# Patient Record
Sex: Female | Born: 1935 | Race: White | Hispanic: No | Smoking: Never smoker
Health system: Southern US, Community
[De-identification: ages and names within clinical notes are randomized; demographics above are authoritative.]

## PROBLEM LIST (undated history)

## (undated) DIAGNOSIS — I10 Essential (primary) hypertension: Secondary | ICD-10-CM

## (undated) DIAGNOSIS — R531 Weakness: Secondary | ICD-10-CM

## (undated) DIAGNOSIS — J449 Chronic obstructive pulmonary disease, unspecified: Secondary | ICD-10-CM

## (undated) DIAGNOSIS — K5900 Constipation, unspecified: Secondary | ICD-10-CM

## (undated) DIAGNOSIS — F0390 Unspecified dementia without behavioral disturbance: Secondary | ICD-10-CM

## (undated) DIAGNOSIS — K219 Gastro-esophageal reflux disease without esophagitis: Secondary | ICD-10-CM

## (undated) DIAGNOSIS — M810 Age-related osteoporosis without current pathological fracture: Secondary | ICD-10-CM

## (undated) DIAGNOSIS — J309 Allergic rhinitis, unspecified: Secondary | ICD-10-CM

## (undated) DIAGNOSIS — I4891 Unspecified atrial fibrillation: Secondary | ICD-10-CM

## (undated) DIAGNOSIS — H409 Unspecified glaucoma: Secondary | ICD-10-CM

## (undated) DIAGNOSIS — J8410 Pulmonary fibrosis, unspecified: Secondary | ICD-10-CM

## (undated) DIAGNOSIS — R1013 Epigastric pain: Secondary | ICD-10-CM

## (undated) HISTORY — PX: ABDOMINAL HYSTERECTOMY: SHX81

## (undated) HISTORY — PX: TOTAL HIP ARTHROPLASTY: SHX124

---

## 2005-01-19 ENCOUNTER — Other Ambulatory Visit: Payer: Self-pay

## 2005-01-19 ENCOUNTER — Inpatient Hospital Stay: Payer: Self-pay | Admitting: Internal Medicine

## 2006-08-08 ENCOUNTER — Emergency Department: Payer: Self-pay | Admitting: Emergency Medicine

## 2007-11-14 ENCOUNTER — Emergency Department: Payer: Self-pay | Admitting: Emergency Medicine

## 2007-11-14 ENCOUNTER — Other Ambulatory Visit: Payer: Self-pay

## 2011-03-16 ENCOUNTER — Emergency Department: Payer: Self-pay | Admitting: Emergency Medicine

## 2011-04-18 ENCOUNTER — Emergency Department: Payer: Self-pay | Admitting: Emergency Medicine

## 2011-05-11 ENCOUNTER — Emergency Department: Payer: Self-pay | Admitting: *Deleted

## 2011-08-06 ENCOUNTER — Inpatient Hospital Stay: Payer: Self-pay | Admitting: Internal Medicine

## 2012-01-18 ENCOUNTER — Inpatient Hospital Stay: Payer: Self-pay | Admitting: Internal Medicine

## 2012-01-18 LAB — CBC WITH DIFFERENTIAL/PLATELET
Basophil #: 0 10*3/uL (ref 0.0–0.1)
Basophil %: 0.3 %
Eosinophil #: 0 10*3/uL (ref 0.0–0.7)
Lymphocyte #: 1.1 10*3/uL (ref 1.0–3.6)
MCV: 99 fL (ref 80–100)
Monocyte #: 0.7 x10 3/mm (ref 0.2–0.9)
Monocyte %: 7.2 %
Neutrophil #: 8.1 10*3/uL — ABNORMAL HIGH (ref 1.4–6.5)
Platelet: 241 10*3/uL (ref 150–440)

## 2012-01-18 LAB — BASIC METABOLIC PANEL
BUN: 26 mg/dL — ABNORMAL HIGH (ref 7–18)
Calcium, Total: 9.2 mg/dL (ref 8.5–10.1)
Co2: 32 mmol/L (ref 21–32)
Creatinine: 0.89 mg/dL (ref 0.60–1.30)
EGFR (African American): >60
Osmolality: 279 (ref 275–301)
Potassium: 4.7 mmol/L (ref 3.5–5.1)
Sodium: 137 mmol/L (ref 136–145)

## 2012-01-18 LAB — PROTIME-INR: INR: 2.1

## 2012-01-19 LAB — BASIC METABOLIC PANEL
Anion Gap: 8 (ref 7–16)
Chloride: 98 mmol/L (ref 98–107)
Co2: 31 mmol/L (ref 21–32)
Creatinine: 1.34 mg/dL — ABNORMAL HIGH (ref 0.60–1.30)
EGFR (African American): 41 — ABNORMAL LOW
EGFR (Non-African Amer.): 35 — ABNORMAL LOW
Glucose: 116 mg/dL — ABNORMAL HIGH (ref 65–99)
Osmolality: 280 (ref 275–301)
Potassium: 4.6 mmol/L (ref 3.5–5.1)
Sodium: 137 mmol/L (ref 136–145)

## 2012-01-19 LAB — CBC WITH DIFFERENTIAL/PLATELET
Eosinophil #: 0.2 10*3/uL (ref 0.0–0.7)
HCT: 26.9 % — ABNORMAL LOW (ref 35.0–47.0)
Lymphocyte #: 2.2 10*3/uL (ref 1.0–3.6)
Lymphocyte %: 19.7 %
MCH: 33.1 pg (ref 26.0–34.0)
MCV: 99 fL (ref 80–100)
Monocyte %: 12 %
Neutrophil #: 7.3 10*3/uL — ABNORMAL HIGH (ref 1.4–6.5)
Neutrophil %: 66.6 %
WBC: 11 10*3/uL (ref 3.6–11.0)

## 2012-01-19 LAB — PROTIME-INR: INR: 2

## 2012-01-19 LAB — HEMOGLOBIN: HGB: 9.4 g/dL — ABNORMAL LOW (ref 12.0–16.0)

## 2012-01-20 LAB — CBC WITH DIFFERENTIAL/PLATELET
Basophil #: 0 10*3/uL (ref 0.0–0.1)
Basophil %: 0.3 %
Eosinophil #: 0.2 10*3/uL (ref 0.0–0.7)
Eosinophil %: 1.5 %
HCT: 27.5 % — ABNORMAL LOW (ref 35.0–47.0)
MCH: 33.3 pg (ref 26.0–34.0)
MCV: 98 fL (ref 80–100)
Monocyte #: 0.9 x10 3/mm (ref 0.2–0.9)
Neutrophil #: 7.6 10*3/uL — ABNORMAL HIGH (ref 1.4–6.5)
Neutrophil %: 73.8 %
Platelet: 192 10*3/uL (ref 150–440)
RDW: 12.3 % (ref 11.5–14.5)
WBC: 10.3 10*3/uL (ref 3.6–11.0)

## 2012-01-20 LAB — BASIC METABOLIC PANEL
BUN: 26 mg/dL — ABNORMAL HIGH (ref 7–18)
Chloride: 97 mmol/L — ABNORMAL LOW (ref 98–107)
Co2: 31 mmol/L (ref 21–32)
Creatinine: 0.93 mg/dL (ref 0.60–1.30)
EGFR (Non-African Amer.): 54 — ABNORMAL LOW
Glucose: 102 mg/dL — ABNORMAL HIGH (ref 65–99)
Osmolality: 277 (ref 275–301)
Potassium: 4 mmol/L (ref 3.5–5.1)
Sodium: 136 mmol/L (ref 136–145)

## 2012-01-21 LAB — HEMOGLOBIN: HGB: 9.3 g/dL — ABNORMAL LOW (ref 12.0–16.0)

## 2012-01-21 LAB — PROTIME-INR
INR: 1.7
Prothrombin Time: 20.4 secs — ABNORMAL HIGH (ref 11.5–14.7)

## 2013-03-15 ENCOUNTER — Inpatient Hospital Stay: Payer: Self-pay | Admitting: Orthopedic Surgery

## 2013-03-15 LAB — COMPREHENSIVE METABOLIC PANEL
Albumin: 3.2 g/dL — ABNORMAL LOW (ref 3.4–5.0)
Alkaline Phosphatase: 92 U/L (ref 50–136)
Anion Gap: 6 — ABNORMAL LOW (ref 7–16)
BUN: 29 mg/dL — ABNORMAL HIGH (ref 7–18)
Potassium: 4 mmol/L (ref 3.5–5.1)
SGOT(AST): 32 U/L (ref 15–37)
SGPT (ALT): 30 U/L (ref 12–78)
Sodium: 132 mmol/L — ABNORMAL LOW (ref 136–145)
Total Protein: 7.6 g/dL (ref 6.4–8.2)

## 2013-03-15 LAB — URINALYSIS, COMPLETE
Bilirubin,UR: NEGATIVE
Hyaline Cast: 1
Ketone: NEGATIVE
Leukocyte Esterase: NEGATIVE
Ph: 8 (ref 4.5–8.0)
RBC,UR: 4 /HPF (ref 0–5)
Specific Gravity: 1.011 (ref 1.003–1.030)
WBC UR: <1 /HPF (ref 0–5)

## 2013-03-15 LAB — CBC WITH DIFFERENTIAL/PLATELET
Eosinophil %: 0 %
HGB: 9.8 g/dL — ABNORMAL LOW (ref 12.0–16.0)
Lymphocyte #: 0.7 10*3/uL — ABNORMAL LOW (ref 1.0–3.6)
Lymphocyte %: 3.3 %
MCH: 33 pg (ref 26.0–34.0)
Monocyte %: 5.3 %
Neutrophil %: 91.3 %
Platelet: 265 10*3/uL (ref 150–440)
RBC: 2.97 10*6/uL — ABNORMAL LOW (ref 3.80–5.20)
RDW: 13.4 % (ref 11.5–14.5)

## 2013-03-15 LAB — PROTIME-INR: INR: 2.2

## 2013-03-16 LAB — CBC WITH DIFFERENTIAL/PLATELET
Basophil #: 0 10*3/uL (ref 0.0–0.1)
Eosinophil #: 0.1 10*3/uL (ref 0.0–0.7)
Eosinophil %: 0.4 %
HGB: 10.4 g/dL — ABNORMAL LOW (ref 12.0–16.0)
Lymphocyte %: 13.7 %
MCH: 33.3 pg (ref 26.0–34.0)
MCHC: 34.9 g/dL (ref 32.0–36.0)
MCV: 96 fL (ref 80–100)
Monocyte #: 1.2 x10 3/mm — ABNORMAL HIGH (ref 0.2–0.9)
Monocyte %: 9 %
Neutrophil #: 10.4 10*3/uL — ABNORMAL HIGH (ref 1.4–6.5)
Platelet: 243 10*3/uL (ref 150–440)
RBC: 3.11 10*6/uL — ABNORMAL LOW (ref 3.80–5.20)
WBC: 13.6 10*3/uL — ABNORMAL HIGH (ref 3.6–11.0)

## 2013-03-16 LAB — BASIC METABOLIC PANEL
Anion Gap: 1 — ABNORMAL LOW (ref 7–16)
Calcium, Total: 9.3 mg/dL (ref 8.5–10.1)
Chloride: 96 mmol/L — ABNORMAL LOW (ref 98–107)
Co2: 35 mmol/L — ABNORMAL HIGH (ref 21–32)
Creatinine: 1.01 mg/dL (ref 0.60–1.30)
EGFR (African American): 57 — ABNORMAL LOW
EGFR (Non-African Amer.): 49 — ABNORMAL LOW
Glucose: 94 mg/dL (ref 65–99)
Osmolality: 267 (ref 275–301)

## 2013-03-16 LAB — PROTIME-INR
INR: 1.1
Prothrombin Time: 14.8 secs — ABNORMAL HIGH (ref 11.5–14.7)

## 2013-03-16 IMAGING — CR DG CHEST 2V
1 series · 2 of 2 positions shown · non-contrast
Comparison: none

REASON FOR EXAM: cough
COMMENTS:

[Series 1: view not recorded · 0.17mm/px · 2 of 2 slices shown]
[im 1/2]
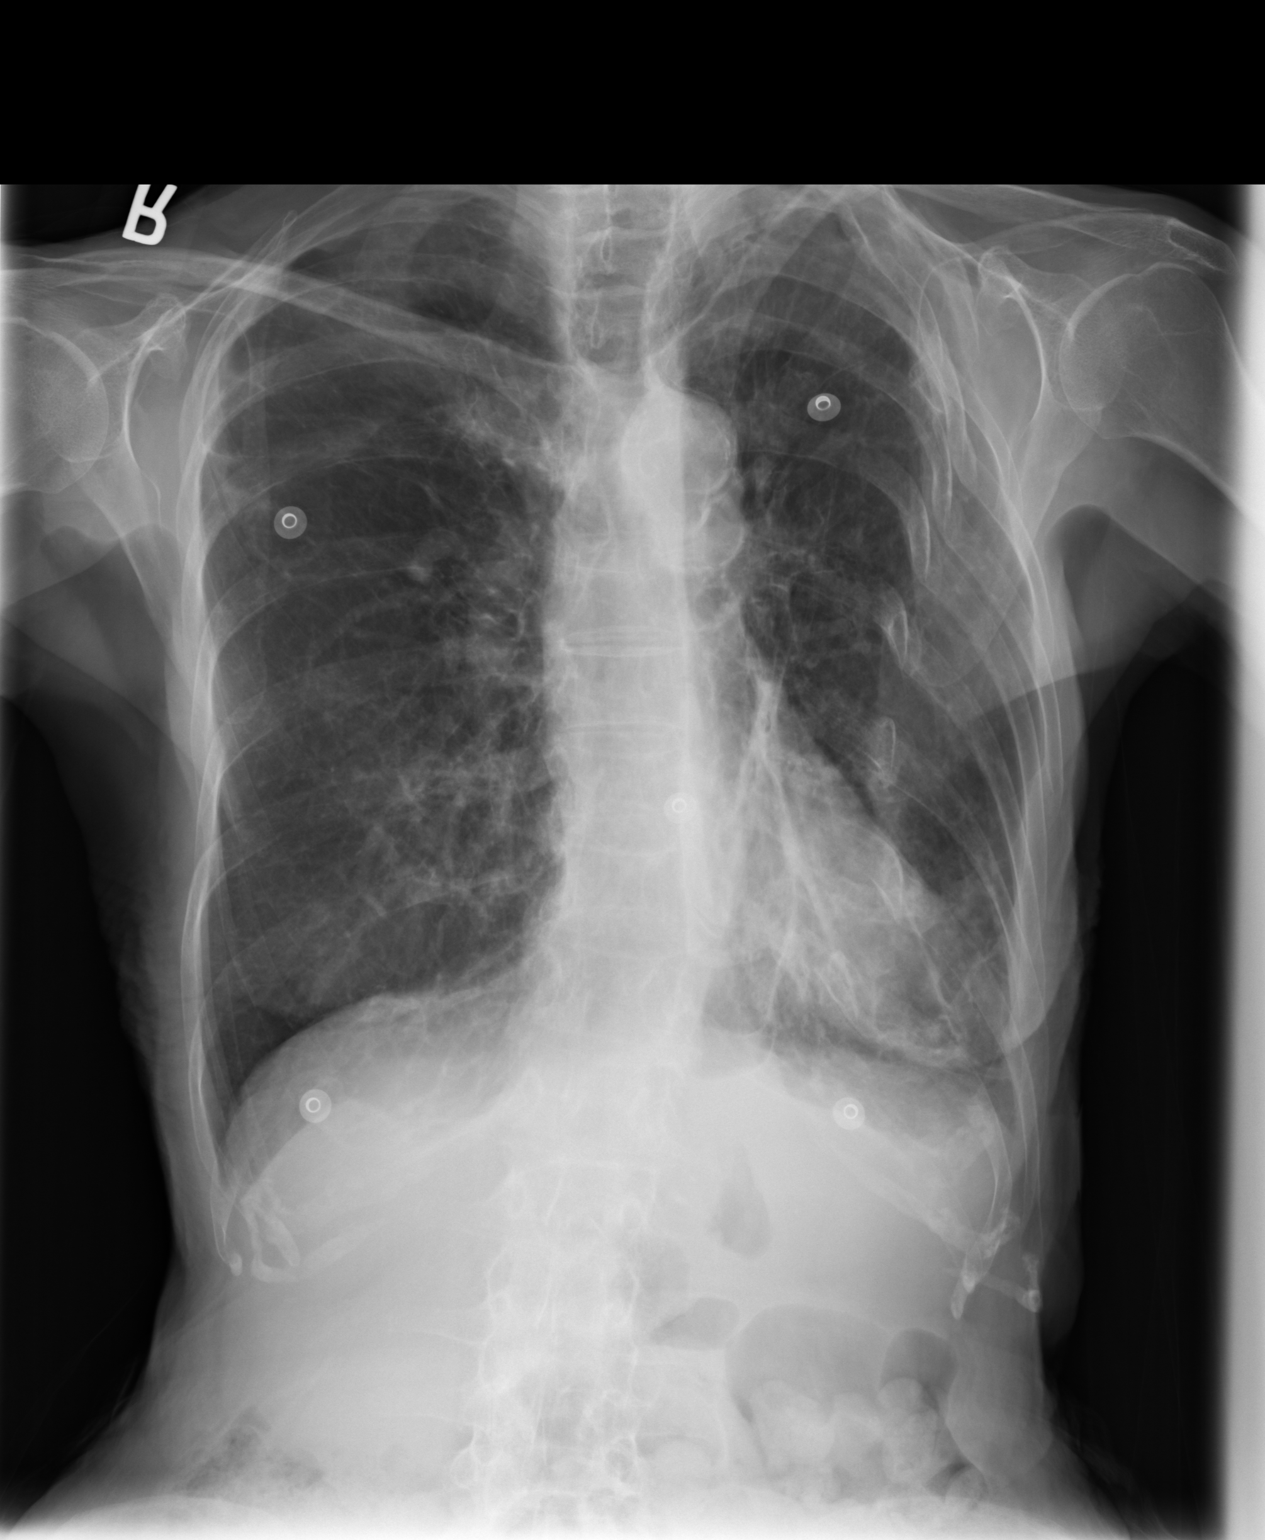
[im 2/2]
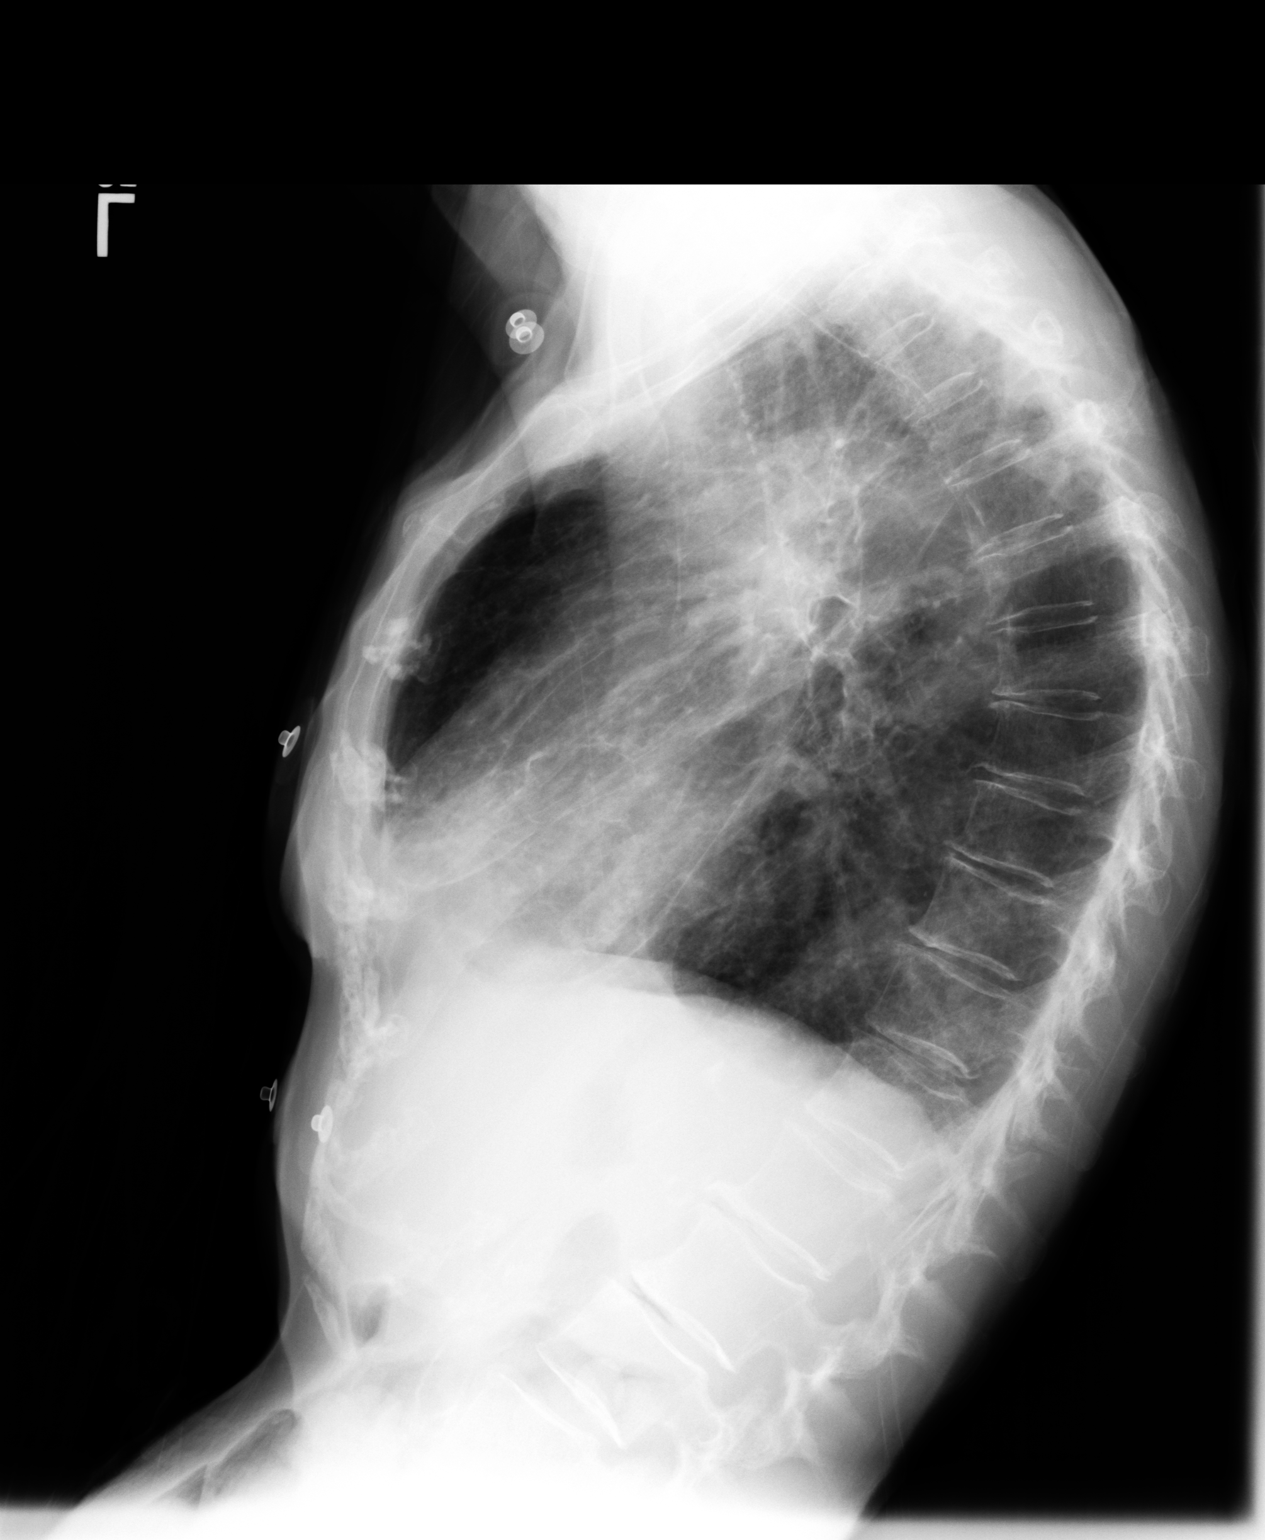

[2 of 2 positions shown; findings below may reference images not displayed]

PROCEDURE:     DXR - DXR CHEST PA (OR AP) AND LATERAL  - March 16, 2011  [DATE]

RESULT:     The lungs are hyperinflated. Old rib deformities are seen
involving the ribs one through 10 at least. There is no pleural effusion or
pneumothorax. There is no shift of the mediastinum. The cardiac silhouette
is normal in size. Coarse lung markings in the left infrahilar region are
present. These lie in the region of the lingula on the lateral film.
IMPRESSION: 1. There are chronic changes in both lungs compatible with COPD. There is
likely superimposed atelectasis or early infiltrate in the lingula.
2. Extensive deformity of the posterior ribs on the left is present.

## 2013-03-17 LAB — PROTIME-INR
INR: 1.3
Prothrombin Time: 16.3 secs — ABNORMAL HIGH (ref 11.5–14.7)

## 2013-03-17 LAB — BASIC METABOLIC PANEL
BUN: 15 mg/dL (ref 7–18)
Chloride: 100 mmol/L (ref 98–107)
Co2: 34 mmol/L — ABNORMAL HIGH (ref 21–32)
Creatinine: 1 mg/dL (ref 0.60–1.30)
Glucose: 128 mg/dL — ABNORMAL HIGH (ref 65–99)
Potassium: 4.6 mmol/L (ref 3.5–5.1)

## 2013-03-17 LAB — PLATELET COUNT: Platelet: 219 10*3/uL (ref 150–440)

## 2013-03-18 LAB — HEMOGLOBIN
HGB: 7.1 g/dL — ABNORMAL LOW (ref 12.0–16.0)
HGB: 8.9 g/dL — ABNORMAL LOW (ref 12.0–16.0)

## 2013-03-18 LAB — PROTIME-INR
INR: 4.5
Prothrombin Time: 40.7 secs — ABNORMAL HIGH (ref 11.5–14.7)

## 2013-03-19 LAB — PROTIME-INR: INR: 4.2

## 2013-03-19 LAB — HEMOGLOBIN: HGB: 9.2 g/dL — ABNORMAL LOW

## 2013-03-20 LAB — PROTIME-INR
INR: 3.7
Prothrombin Time: 35.2 secs — ABNORMAL HIGH (ref 11.5–14.7)

## 2013-03-21 ENCOUNTER — Encounter: Payer: Self-pay | Admitting: Internal Medicine

## 2013-03-21 LAB — PROTIME-INR
INR: 2.6
Prothrombin Time: 27.2 secs — ABNORMAL HIGH (ref 11.5–14.7)

## 2013-03-21 LAB — PATHOLOGY REPORT

## 2013-03-23 LAB — PROTIME-INR
INR: 1.7
Prothrombin Time: 19.6 secs — ABNORMAL HIGH (ref 11.5–14.7)

## 2013-03-27 LAB — PROTIME-INR
INR: 1.6
Prothrombin Time: 18.6 secs — ABNORMAL HIGH (ref 11.5–14.7)

## 2013-04-04 ENCOUNTER — Encounter: Payer: Self-pay | Admitting: Internal Medicine

## 2013-04-06 LAB — PROTIME-INR: Prothrombin Time: 22.1 secs — ABNORMAL HIGH (ref 11.5–14.7)

## 2013-04-18 IMAGING — CR DG CHEST 1V PORT
1 series · 1 of 1 positions shown · non-contrast
Comparison: none

REASON FOR EXAM: sob
COMMENTS:

[view not recorded]
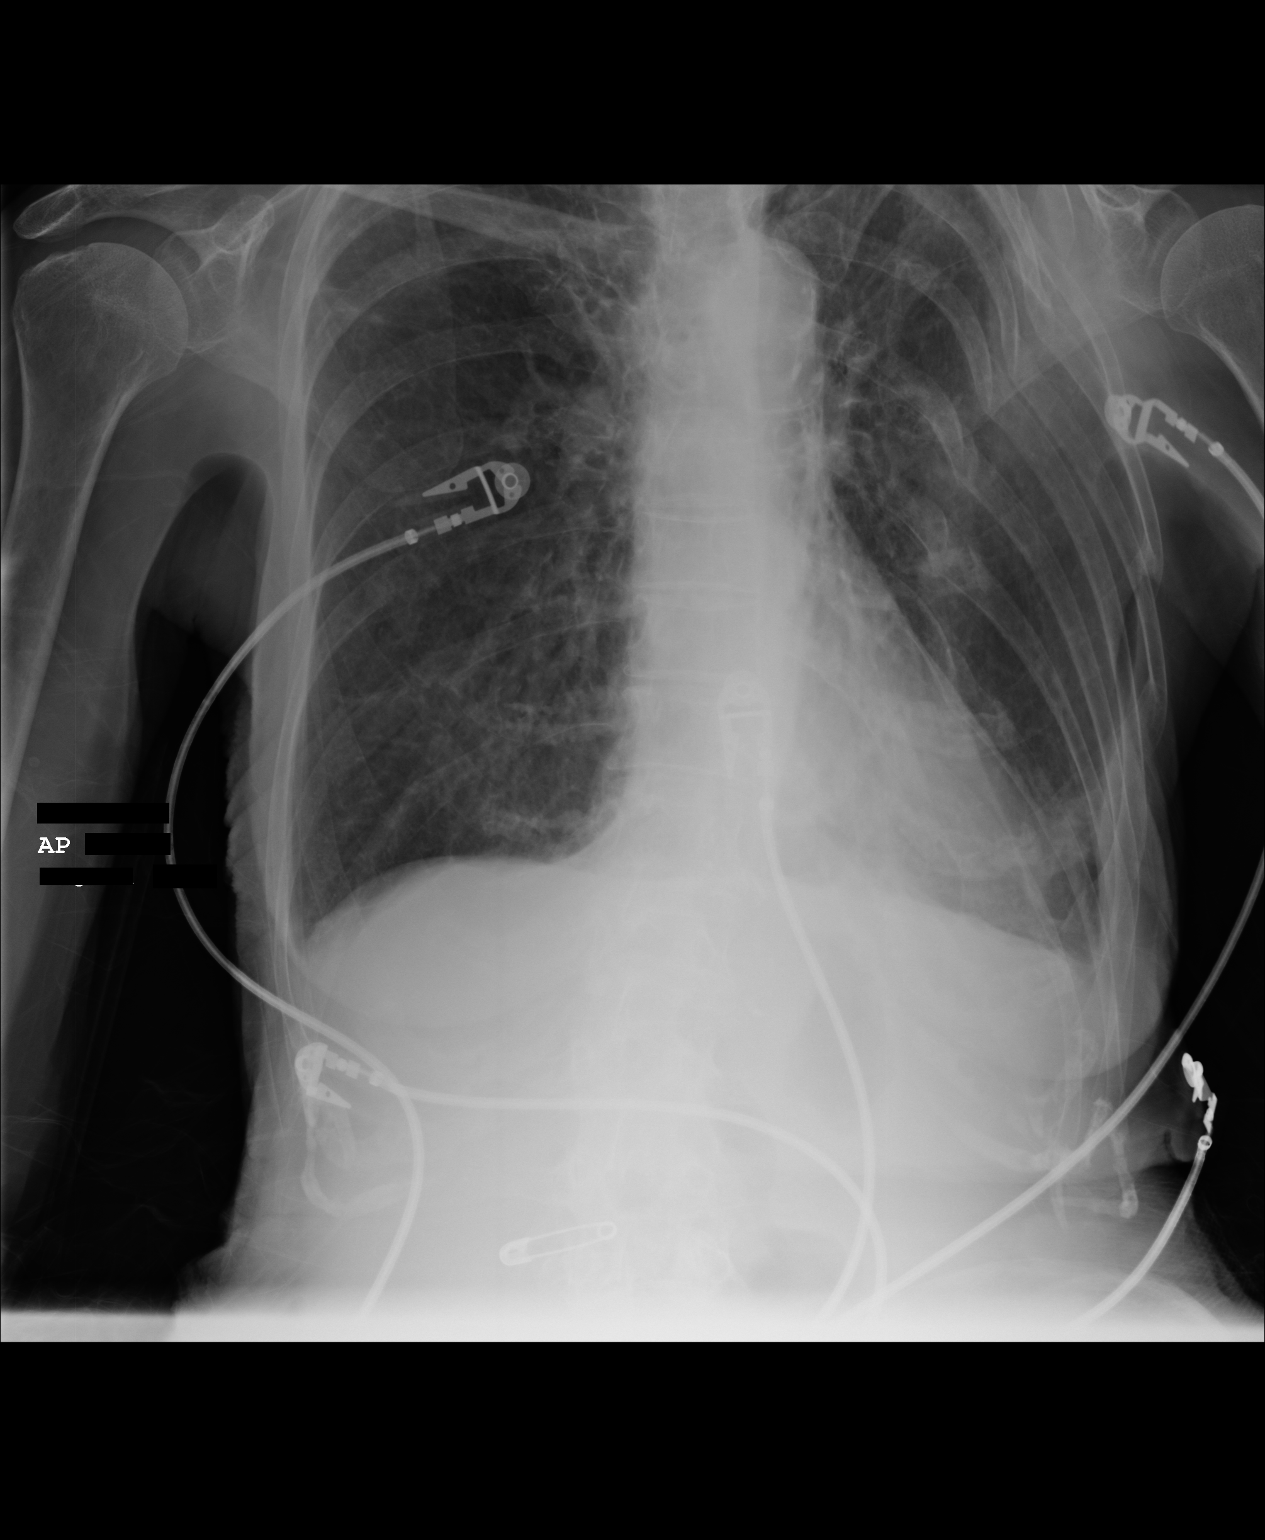

[1 of 1 positions shown; findings below may reference images not displayed]

PROCEDURE:     DXR - DXR PORTABLE CHEST SINGLE VIEW  - April 18, 2011 [DATE]

RESULT:     Comparison is made to study of 08/15/2011.

The lungs are well-expanded. Multiple old rib fractures on the left are
present. I see no pleural effusion or pneumothorax. The cardiac silhouette
is normal in size. The pulmonary vascularity is not engorged. I see no
pleural effusion.
IMPRESSION: The findings are consistent with COPD. I do not see
evidence of CHF nor of pneumonia.

## 2013-04-20 LAB — URINALYSIS, COMPLETE
Bilirubin,UR: NEGATIVE
Glucose,UR: NEGATIVE mg/dL (ref 0–75)
Ketone: NEGATIVE
Nitrite: NEGATIVE
Ph: 5 (ref 4.5–8.0)
RBC,UR: 4 /HPF (ref 0–5)
Specific Gravity: 1.018 (ref 1.003–1.030)
WBC UR: 4 /HPF (ref 0–5)

## 2013-04-22 LAB — URINE CULTURE

## 2013-04-25 LAB — PROTIME-INR
INR: 2.2
Prothrombin Time: 24.3 secs — ABNORMAL HIGH (ref 11.5–14.7)

## 2013-05-14 LAB — COMPREHENSIVE METABOLIC PANEL
Albumin: 2.9 g/dL — ABNORMAL LOW (ref 3.4–5.0)
Anion Gap: 3 — ABNORMAL LOW (ref 7–16)
BUN: 29 mg/dL — ABNORMAL HIGH (ref 7–18)
Bilirubin,Total: 0.2 mg/dL (ref 0.2–1.0)
Chloride: 99 mmol/L (ref 98–107)
Creatinine: 1.04 mg/dL (ref 0.60–1.30)
EGFR (African American): 55 — ABNORMAL LOW
Osmolality: 276 (ref 275–301)
SGOT(AST): 23 U/L (ref 15–37)
Sodium: 134 mmol/L — ABNORMAL LOW (ref 136–145)
Total Protein: 6.7 g/dL (ref 6.4–8.2)

## 2013-05-14 LAB — CBC
HGB: 9.3 g/dL — ABNORMAL LOW (ref 12.0–16.0)
MCH: 34 pg (ref 26.0–34.0)
MCHC: 35.6 g/dL (ref 32.0–36.0)
Platelet: 246 10*3/uL (ref 150–440)
RBC: 2.73 10*6/uL — ABNORMAL LOW (ref 3.80–5.20)
RDW: 13.6 % (ref 11.5–14.5)

## 2013-05-15 ENCOUNTER — Inpatient Hospital Stay: Payer: Self-pay | Admitting: Internal Medicine

## 2013-05-15 LAB — IRON AND TIBC
Iron Bind.Cap.(Total): 300 ug/dL (ref 250–450)
Iron Saturation: 16 %
Unbound Iron-Bind.Cap.: 252 ug/dL

## 2013-05-15 LAB — URINALYSIS, COMPLETE
Bilirubin,UR: NEGATIVE
Nitrite: NEGATIVE
Protein: NEGATIVE
Squamous Epithelial: NONE SEEN
WBC UR: 1 /HPF (ref 0–5)

## 2013-05-15 LAB — MAGNESIUM: Magnesium: 1.7 mg/dL — ABNORMAL LOW

## 2013-05-15 LAB — CK TOTAL AND CKMB (NOT AT ARMC)
CK, Total: 115 U/L (ref 21–215)
CK-MB: 4.8 ng/mL — ABNORMAL HIGH (ref 0.5–3.6)

## 2013-05-15 LAB — PROTIME-INR
INR: 3.4
Prothrombin Time: 33.2 secs — ABNORMAL HIGH (ref 11.5–14.7)

## 2013-05-16 LAB — BASIC METABOLIC PANEL
Anion Gap: 3 — ABNORMAL LOW (ref 7–16)
BUN: 16 mg/dL (ref 7–18)
Calcium, Total: 8.2 mg/dL — ABNORMAL LOW (ref 8.5–10.1)
Co2: 30 mmol/L (ref 21–32)
Osmolality: 280 (ref 275–301)
Sodium: 140 mmol/L (ref 136–145)

## 2013-05-16 LAB — CBC WITH DIFFERENTIAL/PLATELET
Basophil #: 0 10*3/uL (ref 0.0–0.1)
Eosinophil #: 0.1 10*3/uL (ref 0.0–0.7)
HCT: 26.9 % — ABNORMAL LOW (ref 35.0–47.0)
HGB: 9.7 g/dL — ABNORMAL LOW (ref 12.0–16.0)
Lymphocyte #: 1.9 10*3/uL (ref 1.0–3.6)
Lymphocyte %: 22.7 %
MCHC: 36 g/dL (ref 32.0–36.0)
MCV: 94 fL (ref 80–100)
Neutrophil %: 64.5 %
RBC: 2.86 10*6/uL — ABNORMAL LOW (ref 3.80–5.20)
RDW: 13 % (ref 11.5–14.5)
WBC: 8.4 10*3/uL (ref 3.6–11.0)

## 2013-05-16 LAB — PROTIME-INR
INR: 2.4
Prothrombin Time: 25.8 secs — ABNORMAL HIGH (ref 11.5–14.7)

## 2013-05-16 LAB — MAGNESIUM: Magnesium: 1.8 mg/dL

## 2013-05-16 LAB — TSH: Thyroid Stimulating Horm: 4.55 u[IU]/mL — ABNORMAL HIGH

## 2013-05-17 LAB — PROTIME-INR: INR: 1.7

## 2013-05-18 LAB — PROTIME-INR: INR: 1.3

## 2013-06-05 ENCOUNTER — Ambulatory Visit: Payer: Self-pay | Admitting: Internal Medicine

## 2013-06-21 ENCOUNTER — Inpatient Hospital Stay: Payer: Self-pay | Admitting: Internal Medicine

## 2013-06-21 LAB — COMPREHENSIVE METABOLIC PANEL
Albumin: 3.2 g/dL — ABNORMAL LOW (ref 3.4–5.0)
Anion Gap: 1 — ABNORMAL LOW (ref 7–16)
BUN: 25 mg/dL — ABNORMAL HIGH (ref 7–18)
Bilirubin,Total: 0.3 mg/dL (ref 0.2–1.0)
Calcium, Total: 9.3 mg/dL (ref 8.5–10.1)
Creatinine: 0.8 mg/dL (ref 0.60–1.30)
EGFR (Non-African Amer.): >60
Glucose: 193 mg/dL — ABNORMAL HIGH (ref 65–99)
SGOT(AST): 50 U/L — ABNORMAL HIGH (ref 15–37)

## 2013-06-21 LAB — URINALYSIS, COMPLETE
Bilirubin,UR: NEGATIVE
Ketone: NEGATIVE
Leukocyte Esterase: NEGATIVE
Ph: 8 (ref 4.5–8.0)
Protein: NEGATIVE
RBC,UR: 5 /HPF (ref 0–5)
Specific Gravity: 1.005 (ref 1.003–1.030)
Squamous Epithelial: <1

## 2013-06-21 LAB — CBC
HCT: 32.1 % — ABNORMAL LOW (ref 35.0–47.0)
HGB: 11 g/dL — ABNORMAL LOW (ref 12.0–16.0)
MCH: 33.2 pg (ref 26.0–34.0)
RDW: 13.8 % (ref 11.5–14.5)
WBC: 13.2 10*3/uL — ABNORMAL HIGH (ref 3.6–11.0)

## 2013-06-22 LAB — CBC WITH DIFFERENTIAL/PLATELET
Basophil #: 0.1 10*3/uL (ref 0.0–0.1)
Basophil %: 0.3 %
Eosinophil %: 0.3 %
HCT: 32.8 % — ABNORMAL LOW (ref 35.0–47.0)
HGB: 11.3 g/dL — ABNORMAL LOW (ref 12.0–16.0)
Lymphocyte #: 1.3 10*3/uL (ref 1.0–3.6)
Lymphocyte %: 8.2 %
MCH: 33.3 pg (ref 26.0–34.0)
MCHC: 34.3 g/dL (ref 32.0–36.0)
MCV: 97 fL (ref 80–100)
Monocyte #: 1 x10 3/mm — ABNORMAL HIGH (ref 0.2–0.9)
Monocyte %: 6.7 %
Neutrophil #: 13.2 10*3/uL — ABNORMAL HIGH (ref 1.4–6.5)
RBC: 3.38 10*6/uL — ABNORMAL LOW (ref 3.80–5.20)
RDW: 13.4 % (ref 11.5–14.5)

## 2013-06-22 LAB — BASIC METABOLIC PANEL
Anion Gap: 5 — ABNORMAL LOW (ref 7–16)
Calcium, Total: 8.8 mg/dL (ref 8.5–10.1)
Chloride: 102 mmol/L (ref 98–107)
EGFR (Non-African Amer.): >60
Osmolality: 279 (ref 275–301)
Sodium: 138 mmol/L (ref 136–145)

## 2013-06-23 LAB — HEMOGLOBIN: HGB: 7.5 g/dL — ABNORMAL LOW (ref 12.0–16.0)

## 2013-06-23 LAB — BASIC METABOLIC PANEL
Anion Gap: 8 (ref 7–16)
Calcium, Total: 8.2 mg/dL — ABNORMAL LOW (ref 8.5–10.1)
Co2: 27 mmol/L (ref 21–32)
Creatinine: 1.13 mg/dL (ref 0.60–1.30)
Osmolality: 278 (ref 275–301)
Potassium: 3.8 mmol/L (ref 3.5–5.1)

## 2013-06-24 LAB — CBC WITH DIFFERENTIAL/PLATELET
Eosinophil %: 0.2 %
HCT: 25.3 % — ABNORMAL LOW (ref 35.0–47.0)
HGB: 8.9 g/dL — ABNORMAL LOW (ref 12.0–16.0)
Lymphocyte #: 1.1 10*3/uL (ref 1.0–3.6)
MCH: 33.1 pg (ref 26.0–34.0)
MCV: 94 fL (ref 80–100)
Monocyte #: 1.2 x10 3/mm — ABNORMAL HIGH (ref 0.2–0.9)
Monocyte %: 8.8 %
Platelet: 170 10*3/uL (ref 150–440)
RBC: 2.69 10*6/uL — ABNORMAL LOW (ref 3.80–5.20)
WBC: 13.7 10*3/uL — ABNORMAL HIGH (ref 3.6–11.0)

## 2013-06-24 LAB — BASIC METABOLIC PANEL
Anion Gap: 8 (ref 7–16)
BUN: 23 mg/dL — ABNORMAL HIGH (ref 7–18)
Chloride: 100 mmol/L (ref 98–107)
Co2: 24 mmol/L (ref 21–32)
EGFR (African American): >60
EGFR (Non-African Amer.): >60
Osmolality: 269 (ref 275–301)
Sodium: 132 mmol/L — ABNORMAL LOW (ref 136–145)

## 2013-06-25 LAB — BASIC METABOLIC PANEL
BUN: 31 mg/dL — ABNORMAL HIGH (ref 7–18)
Chloride: 103 mmol/L (ref 98–107)
Creatinine: 0.95 mg/dL (ref 0.60–1.30)
EGFR (African American): >60
EGFR (Non-African Amer.): 52 — ABNORMAL LOW
Osmolality: 282 (ref 275–301)
Potassium: 3.5 mmol/L (ref 3.5–5.1)
Sodium: 137 mmol/L (ref 136–145)

## 2013-06-26 LAB — BASIC METABOLIC PANEL
BUN: 33 mg/dL — ABNORMAL HIGH (ref 7–18)
Calcium, Total: 8.6 mg/dL (ref 8.5–10.1)
Chloride: 103 mmol/L (ref 98–107)
Co2: 27 mmol/L (ref 21–32)
Creatinine: 0.86 mg/dL (ref 0.60–1.30)
EGFR (Non-African Amer.): 59 — ABNORMAL LOW
Glucose: 88 mg/dL (ref 65–99)
Potassium: 2.6 mmol/L — ABNORMAL LOW (ref 3.5–5.1)

## 2013-06-26 LAB — MAGNESIUM: Magnesium: 2 mg/dL

## 2013-06-27 LAB — BASIC METABOLIC PANEL
Anion Gap: 5 — ABNORMAL LOW (ref 7–16)
BUN: 46 mg/dL — ABNORMAL HIGH (ref 7–18)
Calcium, Total: 8.2 mg/dL — ABNORMAL LOW (ref 8.5–10.1)
Co2: 29 mmol/L (ref 21–32)
Creatinine: 1.17 mg/dL (ref 0.60–1.30)
EGFR (Non-African Amer.): 41 — ABNORMAL LOW
Potassium: 3.8 mmol/L (ref 3.5–5.1)

## 2013-06-28 LAB — BASIC METABOLIC PANEL
BUN: 44 mg/dL — ABNORMAL HIGH (ref 7–18)
Calcium, Total: 8.6 mg/dL (ref 8.5–10.1)
Chloride: 104 mmol/L (ref 98–107)
Co2: 28 mmol/L (ref 21–32)
Creatinine: 0.95 mg/dL (ref 0.60–1.30)
EGFR (African American): >60
EGFR (Non-African Amer.): 52 — ABNORMAL LOW
Glucose: 193 mg/dL — ABNORMAL HIGH (ref 65–99)
Potassium: 4.3 mmol/L (ref 3.5–5.1)

## 2013-06-28 LAB — CBC WITH DIFFERENTIAL/PLATELET
Basophil #: 0 10*3/uL (ref 0.0–0.1)
Eosinophil #: 0 10*3/uL (ref 0.0–0.7)
Eosinophil %: 0 %
HGB: 8.8 g/dL — ABNORMAL LOW (ref 12.0–16.0)
Lymphocyte #: 0.8 10*3/uL — ABNORMAL LOW (ref 1.0–3.6)
Lymphocyte %: 6.4 %
Platelet: 250 10*3/uL (ref 150–440)
RBC: 2.68 10*6/uL — ABNORMAL LOW (ref 3.80–5.20)
WBC: 12.7 10*3/uL — ABNORMAL HIGH (ref 3.6–11.0)

## 2013-06-29 LAB — BASIC METABOLIC PANEL
BUN: 43 mg/dL — ABNORMAL HIGH (ref 7–18)
Calcium, Total: 8.6 mg/dL (ref 8.5–10.1)
Chloride: 106 mmol/L (ref 98–107)
Co2: 29 mmol/L (ref 21–32)
Creatinine: 1 mg/dL (ref 0.60–1.30)
EGFR (African American): 57 — ABNORMAL LOW
EGFR (Non-African Amer.): 49 — ABNORMAL LOW
Osmolality: 294 (ref 275–301)

## 2013-07-05 ENCOUNTER — Ambulatory Visit: Payer: Self-pay | Admitting: Internal Medicine

## 2013-10-05 ENCOUNTER — Ambulatory Visit: Payer: Self-pay | Admitting: Internal Medicine

## 2013-10-10 ENCOUNTER — Inpatient Hospital Stay: Payer: Self-pay | Admitting: Internal Medicine

## 2013-10-10 LAB — CBC WITH DIFFERENTIAL/PLATELET
BASOS ABS: 0.1 10*3/uL (ref 0.0–0.1)
Basophil %: 0.7 %
Eosinophil #: 0 10*3/uL (ref 0.0–0.7)
Eosinophil %: 0.1 %
HCT: 33.5 % — ABNORMAL LOW (ref 35.0–47.0)
HGB: 11.3 g/dL — ABNORMAL LOW (ref 12.0–16.0)
Lymphocyte #: 1.5 10*3/uL (ref 1.0–3.6)
Lymphocyte %: 19.4 %
MCH: 32.7 pg (ref 26.0–34.0)
MCHC: 33.8 g/dL (ref 32.0–36.0)
MCV: 97 fL (ref 80–100)
MONO ABS: 1.1 x10 3/mm — AB (ref 0.2–0.9)
MONOS PCT: 14.9 %
Neutrophil #: 4.9 10*3/uL (ref 1.4–6.5)
Neutrophil %: 64.9 %
PLATELETS: 181 10*3/uL (ref 150–440)
RBC: 3.46 10*6/uL — ABNORMAL LOW (ref 3.80–5.20)
RDW: 14.2 % (ref 11.5–14.5)
WBC: 7.5 10*3/uL (ref 3.6–11.0)

## 2013-10-10 LAB — URINALYSIS, COMPLETE
BACTERIA: NONE SEEN
Bilirubin,UR: NEGATIVE
Glucose,UR: NEGATIVE mg/dL (ref 0–75)
KETONE: NEGATIVE
NITRITE: NEGATIVE
PH: 6 (ref 4.5–8.0)
Specific Gravity: 1.017 (ref 1.003–1.030)

## 2013-10-10 LAB — COMPREHENSIVE METABOLIC PANEL
ANION GAP: 4 — AB (ref 7–16)
AST: 25 U/L (ref 15–37)
Albumin: 2.9 g/dL — ABNORMAL LOW (ref 3.4–5.0)
Alkaline Phosphatase: 96 U/L
BUN: 24 mg/dL — ABNORMAL HIGH (ref 7–18)
Bilirubin,Total: 0.4 mg/dL (ref 0.2–1.0)
CALCIUM: 8.8 mg/dL (ref 8.5–10.1)
Chloride: 100 mmol/L (ref 98–107)
Co2: 31 mmol/L (ref 21–32)
Creatinine: 0.97 mg/dL (ref 0.60–1.30)
GFR CALC AF AMER: 59 — AB
GFR CALC NON AF AMER: 51 — AB
Glucose: 85 mg/dL (ref 65–99)
Osmolality: 273 (ref 275–301)
Potassium: 3.5 mmol/L (ref 3.5–5.1)
SGPT (ALT): 17 U/L (ref 12–78)
Sodium: 135 mmol/L — ABNORMAL LOW (ref 136–145)
Total Protein: 6.9 g/dL (ref 6.4–8.2)

## 2013-10-12 LAB — URINE CULTURE

## 2013-10-13 LAB — CREATININE, SERUM
CREATININE: 0.66 mg/dL (ref 0.60–1.30)
EGFR (Non-African Amer.): >60

## 2013-10-14 ENCOUNTER — Inpatient Hospital Stay: Payer: Self-pay | Admitting: Internal Medicine

## 2013-10-14 LAB — BASIC METABOLIC PANEL
Anion Gap: 3 — ABNORMAL LOW (ref 7–16)
BUN: 23 mg/dL — ABNORMAL HIGH (ref 7–18)
CALCIUM: 8.7 mg/dL (ref 8.5–10.1)
Chloride: 101 mmol/L (ref 98–107)
Co2: 31 mmol/L (ref 21–32)
Creatinine: 0.78 mg/dL (ref 0.60–1.30)
EGFR (African American): >60
Glucose: 205 mg/dL — ABNORMAL HIGH (ref 65–99)
Osmolality: 280 (ref 275–301)
Potassium: 3.3 mmol/L — ABNORMAL LOW (ref 3.5–5.1)
Sodium: 135 mmol/L — ABNORMAL LOW (ref 136–145)

## 2013-10-14 LAB — CBC
HCT: 39.8 % (ref 35.0–47.0)
HGB: 13.5 g/dL (ref 12.0–16.0)
MCH: 32.9 pg (ref 26.0–34.0)
MCHC: 33.8 g/dL (ref 32.0–36.0)
MCV: 97 fL (ref 80–100)
Platelet: 265 10*3/uL (ref 150–440)
RBC: 4.09 10*6/uL (ref 3.80–5.20)
RDW: 14.6 % — ABNORMAL HIGH (ref 11.5–14.5)
WBC: 19.1 10*3/uL — ABNORMAL HIGH (ref 3.6–11.0)

## 2013-10-14 LAB — CK-MB
CK-MB: 2.8 ng/mL (ref 0.5–3.6)
CK-MB: 2.7 ng/mL (ref 0.5–3.6)
CK-MB: 2.4 ng/mL (ref 0.5–3.6)

## 2013-10-14 LAB — TROPONIN I: Troponin-I: 0.02 ng/mL

## 2013-10-14 LAB — RAPID INFLUENZA A&B ANTIGENS (ARMC ONLY)

## 2013-10-14 LAB — TSH: Thyroid Stimulating Horm: 4.61 u[IU]/mL — ABNORMAL HIGH

## 2013-10-15 LAB — CBC WITH DIFFERENTIAL/PLATELET
BASOS PCT: 0.5 %
Basophil #: 0 10*3/uL (ref 0.0–0.1)
EOS ABS: 0 10*3/uL (ref 0.0–0.7)
Eosinophil %: 0 %
HCT: 34 % — AB (ref 35.0–47.0)
HGB: 12.1 g/dL (ref 12.0–16.0)
LYMPHS ABS: 0.7 10*3/uL — AB (ref 1.0–3.6)
LYMPHS PCT: 7.9 %
MCH: 33.8 pg (ref 26.0–34.0)
MCHC: 35.5 g/dL (ref 32.0–36.0)
MCV: 95 fL (ref 80–100)
Monocyte #: 0.6 x10 3/mm (ref 0.2–0.9)
Monocyte %: 7.5 %
NEUTROS ABS: 7.2 10*3/uL — AB (ref 1.4–6.5)
Neutrophil %: 84.1 %
Platelet: 163 10*3/uL (ref 150–440)
RBC: 3.57 10*6/uL — ABNORMAL LOW (ref 3.80–5.20)
RDW: 13.8 % (ref 11.5–14.5)
WBC: 8.6 10*3/uL (ref 3.6–11.0)

## 2013-10-15 LAB — COMPREHENSIVE METABOLIC PANEL
ALBUMIN: 2.3 g/dL — AB (ref 3.4–5.0)
Alkaline Phosphatase: 70 U/L
Anion Gap: 3 — ABNORMAL LOW (ref 7–16)
BUN: 21 mg/dL — ABNORMAL HIGH (ref 7–18)
Bilirubin,Total: 0.3 mg/dL (ref 0.2–1.0)
CHLORIDE: 94 mmol/L — AB (ref 98–107)
Calcium, Total: 8.2 mg/dL — ABNORMAL LOW (ref 8.5–10.1)
Co2: 38 mmol/L — ABNORMAL HIGH (ref 21–32)
Creatinine: 0.99 mg/dL (ref 0.60–1.30)
GFR CALC AF AMER: 58 — AB
GFR CALC NON AF AMER: 50 — AB
GLUCOSE: 186 mg/dL — AB (ref 65–99)
OSMOLALITY: 278 (ref 275–301)
Potassium: 3.6 mmol/L (ref 3.5–5.1)
SGOT(AST): 23 U/L (ref 15–37)
SGPT (ALT): 30 U/L (ref 12–78)
SODIUM: 135 mmol/L — AB (ref 136–145)
Total Protein: 6 g/dL — ABNORMAL LOW (ref 6.4–8.2)

## 2013-10-15 LAB — LIPID PANEL
Cholesterol: 167 mg/dL (ref 0–200)
HDL: 36 mg/dL — AB (ref 40–60)
Ldl Cholesterol, Calc: 103 mg/dL — ABNORMAL HIGH (ref 0–100)
Triglycerides: 140 mg/dL (ref 0–200)
VLDL CHOLESTEROL, CALC: 28 mg/dL (ref 5–40)

## 2013-10-15 LAB — PRO B NATRIURETIC PEPTIDE: B-Type Natriuretic Peptide: 15076 pg/mL — ABNORMAL HIGH (ref 0–450)

## 2013-10-16 LAB — BASIC METABOLIC PANEL
BUN: 26 mg/dL — ABNORMAL HIGH (ref 7–18)
CALCIUM: 9 mg/dL (ref 8.5–10.1)
CREATININE: 0.89 mg/dL (ref 0.60–1.30)
Chloride: 92 mmol/L — ABNORMAL LOW (ref 98–107)
Co2: 45 mmol/L (ref 21–32)
EGFR (Non-African Amer.): 57 — ABNORMAL LOW
Glucose: 90 mg/dL (ref 65–99)
OSMOLALITY: 276 (ref 275–301)
Potassium: 3 mmol/L — ABNORMAL LOW (ref 3.5–5.1)
Sodium: 136 mmol/L (ref 136–145)

## 2013-10-16 LAB — VANCOMYCIN, TROUGH: Vancomycin, Trough: 6 ug/mL — ABNORMAL LOW (ref 10–20)

## 2013-10-17 LAB — BASIC METABOLIC PANEL
Anion Gap: 1 — ABNORMAL LOW (ref 7–16)
BUN: 28 mg/dL — ABNORMAL HIGH (ref 7–18)
CO2: 41 mmol/L — AB (ref 21–32)
Calcium, Total: 9 mg/dL (ref 8.5–10.1)
Chloride: 90 mmol/L — ABNORMAL LOW (ref 98–107)
Creatinine: 1.02 mg/dL (ref 0.60–1.30)
EGFR (African American): 56 — ABNORMAL LOW
EGFR (Non-African Amer.): 48 — ABNORMAL LOW
GLUCOSE: 121 mg/dL — AB (ref 65–99)
OSMOLALITY: 271 (ref 275–301)
Potassium: 3.4 mmol/L — ABNORMAL LOW (ref 3.5–5.1)
Sodium: 132 mmol/L — ABNORMAL LOW (ref 136–145)

## 2013-10-18 LAB — BASIC METABOLIC PANEL
Anion Gap: 0 — ABNORMAL LOW (ref 7–16)
BUN: 29 mg/dL — ABNORMAL HIGH (ref 7–18)
CO2: 42 mmol/L — AB (ref 21–32)
CREATININE: 0.84 mg/dL (ref 0.60–1.30)
Calcium, Total: 8.9 mg/dL (ref 8.5–10.1)
Chloride: 90 mmol/L — ABNORMAL LOW (ref 98–107)
EGFR (African American): >60
EGFR (Non-African Amer.): >60
GLUCOSE: 107 mg/dL — AB (ref 65–99)
Osmolality: 271 (ref 275–301)
Potassium: 3.1 mmol/L — ABNORMAL LOW (ref 3.5–5.1)
SODIUM: 132 mmol/L — AB (ref 136–145)

## 2013-10-18 LAB — PHOSPHORUS: Phosphorus: 2.8 mg/dL (ref 2.5–4.9)

## 2013-10-19 LAB — CULTURE, BLOOD (SINGLE)

## 2013-11-05 ENCOUNTER — Ambulatory Visit: Payer: Self-pay | Admitting: Internal Medicine

## 2014-08-21 ENCOUNTER — Emergency Department: Payer: Self-pay | Admitting: Internal Medicine

## 2014-08-21 LAB — URINALYSIS, COMPLETE
Bacteria: NONE SEEN
Bilirubin,UR: NEGATIVE
Blood: NEGATIVE
Glucose,UR: NEGATIVE mg/dL (ref 0–75)
KETONE: NEGATIVE
Leukocyte Esterase: NEGATIVE
NITRITE: NEGATIVE
PROTEIN: NEGATIVE
Ph: 6 (ref 4.5–8.0)
RBC,UR: 1 /HPF (ref 0–5)
Specific Gravity: 1.005 (ref 1.003–1.030)
Squamous Epithelial: NONE SEEN

## 2014-08-21 LAB — COMPREHENSIVE METABOLIC PANEL
ALK PHOS: 76 U/L
ANION GAP: 5 — AB (ref 7–16)
AST: 20 U/L (ref 15–37)
Albumin: 3.7 g/dL (ref 3.4–5.0)
BILIRUBIN TOTAL: 0.4 mg/dL (ref 0.2–1.0)
BUN: 32 mg/dL — ABNORMAL HIGH (ref 7–18)
CALCIUM: 9.7 mg/dL (ref 8.5–10.1)
CHLORIDE: 100 mmol/L (ref 98–107)
CO2: 33 mmol/L — AB (ref 21–32)
CREATININE: 1.27 mg/dL (ref 0.60–1.30)
GLUCOSE: 113 mg/dL — AB (ref 65–99)
Osmolality: 283 (ref 275–301)
POTASSIUM: 3.8 mmol/L (ref 3.5–5.1)
SGPT (ALT): 18 U/L
Sodium: 138 mmol/L (ref 136–145)
TOTAL PROTEIN: 7.4 g/dL (ref 6.4–8.2)

## 2014-08-21 LAB — CBC
HCT: 36.8 % (ref 35.0–47.0)
HGB: 12.6 g/dL (ref 12.0–16.0)
MCH: 33.9 pg (ref 26.0–34.0)
MCHC: 34.2 g/dL (ref 32.0–36.0)
MCV: 99 fL (ref 80–100)
PLATELETS: 223 10*3/uL (ref 150–440)
RBC: 3.71 10*6/uL — ABNORMAL LOW (ref 3.80–5.20)
RDW: 12.7 % (ref 11.5–14.5)
WBC: 9.6 10*3/uL (ref 3.6–11.0)

## 2014-08-21 LAB — PRO B NATRIURETIC PEPTIDE: B-TYPE NATIURETIC PEPTID: 4180 pg/mL — AB (ref 0–450)

## 2014-08-21 LAB — TROPONIN I: Troponin-I: <0.02 ng/mL

## 2015-01-25 NOTE — Op Note (Signed)
PATIENT NAME:  Kelly ShaggyMAUNEY, Chaitra C MR#:  [id number] DATE OF BIRTH:  1920/02/08  DATE OF PROCEDURE:  03/16/2013  PREOPERATIVE DIAGNOSIS:  1.  Right femoral neck fracture, displaced.  2.  Right hip osteoarthritis.   POSTOPERATIVE DIAGNOSIS:  Right femoral neck fracture, displaced.   PROCEDURE: Right hip total hip replacement.   ANESTHESIA:  Spinal.   SURGEON: Leitha SchullerMichael J. Lambros Cerro, M.D.   ASSISTANT:  Dedra Skeensodd Mundy, PA-C.   DESCRIPTION OF PROCEDURE: The patient was brought to the operating room and after adequate anesthesia was obtained, the patient was transferred to the operative table with the left leg in the well leg holder, the right leg in the Medacta attachment. After prepping and draping in the usual sterile fashion, appropriate patient identification and timeout procedures were completed. C-arm views of the affected hip were obtained with traction views to try to restore length. An anterior approach was made centered at the level of the greater trochanter and directed obliquely over lateral to the medial border of the tensor fascia lata muscle. An approximately 8 cm incision was made and the incision carried down to the tensor muscle. The tensor was separated from the fascia and retracted laterally. Deep fascia incised to expose the rectus and the femoral circumflex also, which was cauterized. The capsule was then opened and a femoral neck cut carried out with the neck removed and sent as specimen. The head was removed with some difficulty since the corkscrew could not be used as it was a subcapital fracture. There degenerative change present. It measured approximately 46 to 48 mm. Sequential reaming was then carried out up to 50 mm, at which point there was good bleeding bone. A 50 mm cup was then impacted and appeared stable. Next, the leg was externally rotated and the pubofemoral and ischiofemoral ligaments were released. With the leg dropped into extension and adduction, sequential broaching was  carried out to #3, at which point there was good canal fill. Trials were placed and acceptable position obtained on fluoroscopic views. The #3 stem was then impacted down the canal with a short S femoral head and a corresponding 50 DM liner for  the VersaFit  cup DM.  After this was impacted, the hip was reduced and was stable. The wound was thoroughly irrigated. The deep fascia was closed with a heavy quill suture, 2-0 quill subcutaneously. Xeroform, 4 x 4's, ABD and tape applied. The patient was then transferred to the recovery room in stable condition. X-rays showed good component position.   ESTIMATED BLOOD LOSS: 300 mL.   COMPLICATIONS: None.   SPECIMEN: Resected femoral head and neck.   ____________________________ Leitha SchullerMichael J. Shayma Pfefferle, MD mjm:cc D: 03/16/2013 22:23:11 ET T: 03/16/2013 23:33:17 ET JOB#: [id number]  cc: Leitha SchullerMichael J. Kaiyden Simkin, MD, <Dictator> Leitha SchullerMICHAEL J Amiracle Neises MD ELECTRONICALLY SIGNED 03/17/2013 8:18

## 2015-01-25 NOTE — H&P (Signed)
PATIENT NAME:  Kelly Booth, CHIASSON MR#:  [id number] DATE OF BIRTH:  1920/07/19   PRIMARY CARE PHYSICIAN: Dr. Dan Humphreys with [facility].  REFERRING EMERGENCY ROOM PHYSICIAN: Dr. Margarita Grizzle.   CHIEF COMPLAINT: Left hip pain.   HISTORY OF PRESENT ILLNESS: A 81 year old female patient with history of atrial fibrillation, not on Coumadin, pulmonary fibrosis, who recently had a fall with right hip fracture and sent to rehab in June. She returns to the Emergency Room after she lost her balance while walking with the walker and fell, landing on her left hip. The patient has been found to have a left femur fracture, is being admitted to the hospitalist service, and the patient will need a hip replacement. Orthopedics will be consulted for the same.   The patient does not complain of any problems at this time other than her left hip fracture. Her son at bedside mentions that she has not had any falls lately. Her last fall was prior to the last admission when she was admitted for dehydration and sent to rehab. They were actually thinking of getting the patient home soon in a couple of days, but patient suffered the fall today.   The patient used to live alone prior to her last fracture. She did tolerate her last surgery for the right hip fracture well. Her Coumadin has been stopped secondary to recurrent falls. She is in normal sinus rhythm today in the Emergency Room.   PAST MEDICAL HISTORY:  1.  Hypertension.  2.  Pulmonary fibrosis.  3.  Paroxysmal atrial fibrillation.  4.  Osteoporosis.  5.  Allergies.   PAST SURGICAL HISTORY:  1.  Hysterectomy.  2.  Appendectomy.  3.  Right hip surgery in June 2014 after a fall.   ALLERGIES: CECLOR.   SOCIAL HISTORY: The patient is presently in the rehab. No smoking. No alcohol. No illicit drugs.   FAMILY HISTORY: Cancer and strokes in multiple family members.   HOME MEDICATIONS:  Include:  1.  Acetaminophen 650 mg every six hours as needed.  2.  Calcium vitamin D 1  tablet oral 2 times a day.  3.  Cardizem 120 mg oral once a day.  4.  Ferrous sulfate 325 mg oral once a day.  5.  Latanoprost 1 drop each affected eye at bedtime.  6.  Vitamin D3 1000 international units oral once a day.  7.  Zantac 150 mg oral 2 times a day.   REVIEW OF SYSTEMS: CONSTITUTIONAL: Complains of fatigue and weakness. No weight loss, weight gain.  EYES: No blurred vision, pain, redness.  ENT: No tinnitus, ear pain. Has some hearing loss.  RESPIRATORY: Has on-and-off cough and chronic shortness of breath but not on oxygen.  CARDIOVASCULAR: No chest pain, orthopnea, edema.  GASTROINTESTINAL: No nausea, vomiting, diarrhea, abdominal pain.  HEMOLYMPHATIC:  No anemia, easy bruising, bleeding.  INTEGUMENTARY: No acne, rash, lesions.  MUSCULOSKELETAL: Has pain in the left hip.  NEUROLOGIC: No focal numbness, weakness. Has had recurrent falls.  PSYCHIATRIC: No anxiety or depression. Has mild dementia.   PHYSICAL EXAMINATION:  VITAL SIGNS: Temperature 97.9, pulse 81, blood pressure212/84 and 205/80, saturating 89% on room air and 98% on 2 liters oxygen.  GENERAL: Frail, elderly Caucasian female patient lying in bed in distress secondary to her left hip pain.  PSYCHIATRIC: Alert, awake, pleasant. Good judgment.  HEENT: Atraumatic, normocephalic. Oral mucosa dry and pink. External ears and nose normal. No pallor. No icterus. Pupils bilaterally equal and reactive to light.  NECK: Supple.  No thyromegaly. No palpable lymph nodes. Trachea midline. No carotid bruit, JVD.  CARDIOVASCULAR: S1, S2, systolic loud murmur. Peripheral pulses 2+. No edema.  RESPIRATORY: Has bilateral crackles.  GASTROINTESTINAL: Soft abdomen, nontender. Bowel sounds present. No organomegaly palpable.  SKIN: Warm and dry. No petechiae, rash, ulcers.  MUSCULOSKELETAL: Has decreased range of motion and tenderness of the left hip. No other joint swelling found. Normal muscle tone.  NEUROLOGICAL: Motor strength 5/5 in  upper and lower extremities. Limited assessment of left lower extremity. Sensation to fine touch intact all over.  EXTREMITIES: Peripheral pulses 2+ in lower extremities.   LABORATORY STUDIES: Show glucose 193, BUN 25, creatinine 0.8, sodium 135, potassium 4.6 with AST, ALT, alkaline phosphatase, bilirubin normal. WBC 13.3, hemoglobin 11, platelets of 259. PT/INR pending.   EKG shows normal sinus rhythm with right atrial enlargement, LVH with repolarization changes.   Chest, one view, shows hyperinflation with COPD and pulmonary fibrosis.   Left femur x-ray shows acute fracture of neck of left femur.   ASSESSMENT AND PLAN:  1.  Left femur fracture in a patient who has had recurrent falls and recent right femur fracture. We will admit the patient, consult orthopedics. Dr. Ernest PineHooten of orthopedics has been made aware of the consult and will see the patient. The patient will be on bedrest at this time. Will start Lovenox for deep vein thrombosis prophylaxis and await the surgical schedule. The patient will be seen by physical therapy after surgery. Will need rehab at the time of discharge. Pain medications to be given. The patient is a high risk for surgery considering her advanced age, comorbidities, although she seems to have tolerated, her last surgery well.  2.  Uncontrolled hypertension. This could be secondary to her pain. We will continue her medications and add p.r.n. IV blood pressure medications.  3.  Pulmonary fibrosis. The patient does have pulmonary fibrosis. Will put her on p.r.n. nebulizers. Oxygen as needed to keep her saturations over 88%.  4.  Recurrent falls. The patient is a high risk for falls. Is presently walking with a walker. Will need further rehabilitation.  5.  Paroxysmal atrial fibrillation, presently in normal sinus rhythm. Has been taken off Coumadin secondary to recurrent falls for risk of bleeding.  6.  Deep vein thrombosis prophylaxis, Lovenox.   CODE STATUS: FULL CODE.    Time spent today on this case was 50 minutes.    ____________________________ Molinda BailiffSrikar R. Dequarius Jeffries, MD srs:np D: 06/21/2013 17:05:32 ET T: 06/21/2013 18:25:54 ET JOB#: [id number]  cc: Wardell HeathSrikar R. Markcus Lazenby, MD, <Dictator> Illene LabradorJames P. Angie FavaHooten Jr., MD Letta PateJohn B. Danne HarborWalker III, MD Wardell HeathSRIKAR West Bali Delante Karapetyan MD ELECTRONICALLY SIGNED 06/26/2013 10:43

## 2015-01-25 NOTE — H&P (Signed)
PATIENT NAME:  Kelly ShaggyMAUNEY, Kelly C MR#:  [id number] DATE OF BIRTH:  [birth year]  DATE OF ADMISSION:  03/15/2013  CHIEF COMPLAINT: Right hip pain.   HISTORY OF PRESENT ILLNESS: The patient is a 81 year old female who suffered a fall at home. She lives by herself. She had gotten into the house, thinks she tripped and fell. She came to the Emergency Room, where she was found to have a completely displaced femoral neck fracture as well as a rib fracture. She also has significant history of pulmonary disease and cardiac disease. She has been a very active lady with ability to get to the grocery store by herself. She drives herself to church most weeks and has been quite independent. She has been walking without assistive device.   PAST MEDICAL HISTORY: Remarkable for: 1. Hypertension.  2. Pulmonary fibrosis.  3. Atrial fibrillation.  4. Osteoporosis.  5. Allergies.   PAST SURGERY: 1. Hysterectomy.  2. Appendectomy.   ALLERGIES: CECLOR.   SOCIAL HISTORY: Nondrinker, nonsmoker. She lives alone.   FAMILY HISTORY: Positive for cancer and stroke.   MEDICATIONS ON ADMISSION:  1. Coumadin 2 mg daily.  2. Vitamin D3 1000 units daily.  3. Singulair 10 mg at night.  4. Multivitamin daily.  5. Lisinopril 5 mg daily.  6. Hydrochlorothiazide/triamterene 37.5/25 daily.  7. Iron supplement daily. 8. Citracal with vitamin D daily.  9. Cardizem CD 180 daily.   REVIEW OF SYSTEMS: Negative for shortness of breath or chest pain at present.  PHYSICAL EXAMINATION:   GENERAL: A slender white female who appears in moderate distress secondary to right hip pain.  HEENT: Unremarkable.  LUNGS: Clear.  HEART: Regular rate and rhythm with systolic murmur noted.  EXTREMITIES: Remarkable for externally rotated right lower extremity with a palpable dorsalis pedis pulse. She is able to flex and extend her toes, and sensation is intact.   IMAGING AND LABORATORY DATA: X-rays reveal completely displaced femoral neck  fracture as well as rib fracture. She also has elevated pro time.   CLINICAL IMPRESSION: Right femoral neck fracture in a patient with osteoporosis as well as degenerative arthritis of the hip. She also is noted to have a rib fracture. With regard to her heart, I will have Dr. Lady GaryFath, her regular cardiologist, see her for evaluation and see if there is anything additionally that needs to be done preop. Her Coumadin has been stopped and vitamin K given. Checked pro time daily until it is close to 1.3 to allow for a spinal anesthetic. The risks, benefits and possible complications of surgery, in particular, deep infection and blood clot, were discussed. Plan on Coumadin postoperatively for anticoagulation. Additionally, she is noted to have an elevated white count and has been started on IV levofloxacin. Presumably, she does have some additional infection going on, and try to have that addressed as well prior to operative intervention.   ____________________________ Leitha SchullerMichael J. Manasa Spease, MD mjm:OSi D: 03/15/2013 07:54:50 ET T: 03/15/2013 08:09:11 ET JOB#: [id number]  cc: Leitha SchullerMichael J. Callaway Hardigree, MD, <Dictator> Leitha SchullerMICHAEL J Kennedie Pardoe MD ELECTRONICALLY SIGNED 03/15/2013 12:10

## 2015-01-25 NOTE — Discharge Summary (Signed)
PATIENT NAME:  Kelly Booth, Kelly Booth MR#:  [id number] DATE OF BIRTH:  11/13/1919  DATE OF ADMISSION:  03/15/2013 DATE OF DISCHARGE:  03/19/2013  Dictated for Kennedy Bucker, MD  DATE OF ADMISSION: 03/15/2013.   DATE OF DISCHARGE: 03/19/2013.   ADMITTING DIAGNOSIS: Right femoral neck fracture, displaced, with right hip osteoarthritis.   DISCHARGE DIAGNOSIS: Right femoral neck fracture, displaced, with right hip osteoarthritis.   OPERATION: On 03/16/2013 the patient had a right total hip replacement with an anterior approach.   ANESTHESIA: Spinal.   SURGEON: Kennedy Bucker, M.D.   ASSISTANT: Dedra Skeens, PA-C.   ESTIMATED BLOOD LOSS: 300 mL.   COMPLICATIONS: None.   IMPLANTS USED: A Medacta 3 AMIS stem, 50 mm DM cup and liner, with a 28-S head.   The patient was stabilized, brought to the recovery room, and then brought down to the orthopedic floor.   HISTORY: The patient is a 81 year old female who presented after suffering from a fall at home. The patient was brought to the Emergency Room because she could not put pressure on that leg. The patient had significant pain and had to have assistance with transfers. The patient does ambulate though and was quite active before her injury.   PHYSICAL EXAMINATION: GENERAL: Well-developed, well-nourished female in moderate distress.  HEART: Regular rate and rhythm, although there is a systolic murmur noted.  LUNGS: Clear.  MUSCULOSKELETAL: In regard to the right lower extremity, the patient does have external rotation with shortening and has pain with any type of manipulation or palpation. The patient has an intact pulse. The patient is on Coumadin and this was stopped and has been given vitamin K, with protimes of 1.3, necessary for surgery.   HOSPITAL COURSE: After initial admission on 03/15/2013 the patient was brought to the orthopedic floor. On 03/16/2013 the patient had surgery. On postop day 1 the patient had a hemoglobin of 8.4 and her INR  level was at 1.3. The patient was started back on her Coumadin  2 mg a day. On postoperative day 2 the patient's hemoglobin was at 7.5, where no transfusion was given initially. The patient's hemoglobin on postop day two was 7.1 and her INR was at 4.5. The patient was given 1 unit of transfused blood, and her hemoglobin stabilized to 9.2 on postop day 3 after the Coumadin was stopped on postop day 2, and her INR level was down to 4.2 and her Coumadin continued to be held.   CONDITION AT DISCHARGE: Stable.   DISPOSITION: The patient was sent to rehab.   DISCHARGE INSTRUCTIONS: 1.  The patient will follow up with [facility] in 2 week. The patient will do physical therapy and occupational therapy. The patient will weight bear as tolerated on the affected leg.  2.  The patient has knee-high TED hose which she will use throughout the day and remove every 8-hour shift for 1 hour. The patient will elevate her heels off the bed and be encouraged to do cough and deep-breathing. The patient's diet is regular.  3.  The patient will call the clinic with any bright red bleeding or any calf pain, or bowel or bladder difficulty, or any fever greater than 101.5. The patient will keep her dressing intact, and have it changed on a p.r.n. basis.   DISCHARGE MEDICATIONS: Multivitamin without vitamin K, 1 tablet daily, lisinopril 5 mg 1 tablet daily, hydrochlorothiazide with triamterene 25/37.5 mg daily, ferrous gluconate 1 tablet daily, vitamin D3 1000 international units 1 capsule daily, Tylenol  325 mg q.6 hours as needed for pain or fever greater than 100.4, milk of magnesia 30 mL b.i.d., oxycodone 5 mg 1 tablet q.4 hours p.r.n. for severe pain, warfarin 2 mg orally daily but temporarily on hold, diltiazem 180 mg capsule 1 capsule daily, bisacodyl 10 mg rectally p.r.n. for constipation, Senokot-S 1 tablet p.o. twice a day, Singulair inhalation 10 mg daily, latanoprost 0.005% ophthalmic solution, 1 drop  in the affected once a day, calcium and vitamin D 500 mg/200 units 1 capsule b.i.d., and ferrous fumarate  1 capsule b.i.d.   If there is any question about medications, see the discharge instructions.   The patient will have daily protime and INR levels called to the primary care physician.     ____________________________ Shela CommonsJ. Dedra Skeensodd Temprence Rhines, [place] jtm:dm D: 03/19/2013 06:27:37 ET T: 03/19/2013 07:31:28 ET JOB#: [id number]  cc: J. Dedra Skeensodd Isidore Margraf, GeorgiaPA, <Dictator> J Pal[facility] LLCMUNDY PA ELECTRONICALLY SIGNED 04/17/2013 8:59

## 2015-01-25 NOTE — Consult Note (Signed)
PATIENT NAME:  Kelly Booth, [name] MR#:  [id number] DATE OF BIRTH:  1920/07/21  DATE OF CONSULTATION:  03/15/2013  CONSULTING PHYSICIAN:  Marcina MillardAlexander Londen Lorge, MD  PRIMARY CARE PHYSICIAN: Dr. Dan HumphreysWalker.   CHIEF COMPLAINT: Right hip pain.   REASON FOR CONSULTATION: Consultation requested for preoperative cardiovascular evaluation prior to right hip surgery.   HISTORY OF PRESENT ILLNESS: The patient is a 81 year old female referred for cardiac evaluation prior to right hip surgery. The patient was in her usual state of health when she fell and fractured her right hip. The patient is scheduled for possible right hip surgery tomorrow on 03/16/2013. The patient reports that she has a history of atrial fibrillation, but denies any recent history of chest pain or shortness of breath. The patient denies prior history of myocardial infarction, congestive heart failure, stroke, diabetes or chronic kidney disease.   PAST MEDICAL HISTORY: 1.  Chronic atrial fibrillation.  2.  Hypertension.  3.  Pulmonary fibrosis.  4.  Iron deficiency anemia.  5.  Osteoporosis.   MEDICATIONS:  1.  Maxzide 37/25 one-half tablet daily diltiazem.  2.  Diltiazem CD 180 mg daily.  3.  Lisinopril 5 mg daily.  4.  Coumadin 2 mg daily.  5.  Singulair 10 mg daily.  6.  Calcium 600 mg plus d 1 capsule b.i.d. 7.  Multivitamin 1 daily.   SOCIAL HISTORY: The patient is a widow. She lives alone. She denies tobacco abuse.   FAMILY HISTORY: No immediate family history of coronary disease or myocardial infarction.  REVIEW OF SYSTEMS:  CONSTITUTIONAL: No fever or chills.  EYES: No blurry vision.  EARS: No hearing loss.  RESPIRATORY: No shortness of breath.  CARDIOVASCULAR: The patient has a history of chronic atrial fibrillation.  GASTROINTESTINAL: No nausea, vomiting or diarrhea.  GENITOURINARY: No dysuria or hematuria.  ENDOCRINE: No polyuria or polydipsia.  INTEGUMENTARY: No rash.  MUSCULOSKELETAL: The patient has right hip  fracture.  NEUROLOGIC: No focal muscle weakness or numbness.  PSYCHOLOGICAL: No depression or anxiety.   PHYSICAL EXAMINATION: VITAL SIGNS: Blood pressure 126/51, pulse 61, respirations 18, temperature 97.9, pulse oximetry 100%.  HEENT: Pupils equal, reactive to light and accommodation.  NECK: Supple without thyromegaly.  LUNGS: Clear.   CARDIOVASCULAR: Normal JVP. Normal PMI. Irregular, irregular rhythm. Normal S1, S2. No appreciable gallop, murmur or rub.  ABDOMEN: Soft and nontender. Pulses were intact bilaterally.  EXTREMITIES: The patient has a right hip fracture.  MUSCULOSKELETAL: Normal muscle tone.  NEUROLOGIC: The patient is alert and oriented x 3. Motor and sensory both grossly intact.   IMPRESSION: A 81 year old female with chronic atrial fibrillation, currently on warfarin for stroke prevention, who presents with right hip fracture. The patient is at low risk for serious cardiovascular complication, due to right hip fracture. The patient has no prior history of myocardial infarction, congestive heart failure, stroke, diabetes or chronic kidney disease.   RECOMMENDATIONS: 1.  Agree with current therapy.  2.  Proceed with a right hip surgery as planned.  3.  No further cardiac diagnostics at this time.  ____________________________ Marcina MillardAlexander Stephany Poorman, MD ap:cc D: 03/15/2013 15:53:56 ET T: 03/15/2013 16:10:02 ET JOB#: [id number]  cc: Marcina MillardAlexander Chrystle Murillo, MD, <Dictator> Marcina MillardALEXANDER Lief Palmatier MD ELECTRONICALLY SIGNED 03/20/2013 12:48

## 2015-01-25 NOTE — Consult Note (Signed)
PATIENT NAME:  Kelly ShaggyMAUNEY, Faizah C MR#:  [id number] DATE OF BIRTH:  [birth year]  DATE OF CONSULTATION:  06/21/2013  REFERRING PHYSICIAN:  Dr. Elpidio AnisSudini (hospitalist). CONSULTING PHYSICIAN:  James P. Angie FavaHooten Jr., MD  CHIEF COMPLAINT:  Left hip pain.   HISTORY OF PRESENT ILLNESS:  The patient is a 81 year old female who apparently lost her balance and fell on the day of admission, landing on her left hip. She denied any loss of consciousness. She denies any other problems other than the severe left hip pain. She is unable to stand or bear weight on the left lower extremity due to the hip pain. The patient had been in rehabilitation following surgery for right femoral neck fracture earlier in the year. She had been ambulatory with a walker.   PAST MEDICAL HISTORY:  Hypertension, pulmonary fibrosis, paroxysmal atrial fibrillation, osteoporosis.   PAST SURGICAL HISTORY: Hysterectomy, appendectomy, right total hip arthroplasty for femoral neck fracture.   ALLERGIES:  CECLOR.   MEDICATIONS AT THE TIME OF ADMISSION: Tylenol 650 mg q.6 hours p.r.n., calcium with vitamin D 1 tablet twice a day, Cardizem 120 mg daily, ferrous sulfate 325 mg daily, meteneprost 1 drop to each eye at bedtime, vitamin D3, 1000 International Units daily; Zantac 150 mg daily.   SOCIAL HISTORY:  The patient has been in rehab. No tobacco or alcohol use. No illicit drugs.   FAMILY HISTORY:  Positive for cancer and CVA in multiple family members.   REVIEW OF SYSTEMS:  Positive for fatigue and weakness. Positive hearing loss. Some shortness of breath, as well as nonproductive cough. She does have a history of recurrent falls. No apparent anxiety or depression, but does have some memory loss.   PHYSICAL EXAMINATION: GENERAL:  The patient is an elderly female, seen in no acute distress.  HEENT:  Atraumatic, normocephalic. Sclerae are clear. Extraocular motions intact. Oropharynx is clear with moist mucosa.  NECK:  Supple, nontender, with  good range of motion.  LUNGS:  Clear to auscultation bilaterally.  CARDIAC:  Regular rate and rhythm with normal S1, S2. 3/6 holosystolic murmur is noted. Pedal pulses are palpable bilaterally. Mild edema is noted to both lower extremities.  ABDOMEN:  Soft, nontender, nondistended. Bowel sounds are present.  MUSCULOSKELETAL: The patient's left leg is shortened and externally rotated. Pain is elicited with any attempt at range of motion. No gross ecchymosis or swelling about the hip. No knee effusion.  NEUROLOGIC:  Awake, alert and oriented. Sensory function is grossly intact. Motor strength is felt to be 5/5 with the exception of the left lower extremity, which was not assessed due to the injury. No clonus or tremor.   X-RAYS: AP of the pelvis and AP and lateral of the left hip demonstrate a displaced left femoral neck fracture. Previous right total components are in place.   IMPRESSION:  Displaced left femoral neck fracture.   PLAN:  Findings were discussed in detail with the patient and her family. Recommendation was made for left hip hemiarthroplasty. Risks and benefits of surgical intervention were discussed. The usual perioperative course was also discussed. They expressed their understanding of the risks and benefits and agreed with plans for surgical intervention.   Right-site surgery protocol was completed.   Discussion about possibility of blood transfusion was completed. Usual risks and benefits were discussed.     ____________________________ Illene LabradorJames P. Angie FavaHooten Jr., MD jph:dmm D: 06/21/2013 21:15:30 ET T: 06/21/2013 21:31:39 ET JOB#: [id number]  cc: Fayrene FearingJames P. Angie FavaHooten Jr., MD, <Dictator> JAMES P Angie FavaHOOTEN JR MD  ELECTRONICALLY SIGNED 07/08/2013 9:37

## 2015-01-25 NOTE — H&P (Signed)
PATIENT NAME:  Kelly Booth, Kelly Booth MR#:  [id number] DATE OF BIRTH:  [birth year]  DATE OF ADMISSION:  05/15/2013  PRIMARY CARE PHYSICIAN: Yates DecampJohn Walker III, MD  REFERRING PHYSICIAN: Jene Everyobert Kinner, MD   CHIEF COMPLAINT: Frequent falls, elevated BUN.   HISTORY OF PRESENT ILLNESS: The patient is a 81 year old Caucasian female who lives alone, ambulates with the help of a walker, is brought into the ER after she had 3 episodes of falls in 1 day. The patient is reporting that when she first fell she felt weak in her legs while ambulating with a walker. She made it somehow to get off the and started ambulating again. The second time when she fell she called her neighbors who came and helped her to get off of the floor. The patient fell a third time while she was in the kitchen and at that time her son who lives close by just came to check on her and brought her into the ER. The patient denies any chest pain, shortness of breath or dizziness. No loss of consciousness. She is saying that her legs are weak and she is falling often. The patient lives alone and takes Coumadin for chronic atrial fibrillation. In the ER, CAT scan of the head was done which has revealed no acute findings. X-rays have revealed no acute fractures. The patient's BUN is elevated and magnesium is low at 1.7. Hospitalist team is called to admit the patient for frequent falls and dehydration. The patient's INR is at 3.4. No family members are at bedside during my examination. The patient denies any complaint of loss of consciousness, but complaining of left-sided hip pain and left shoulder pain.   PAST MEDICAL HISTORY: Hypertension, pulmonary fibrosis, chronic atrial fibrillation on Coumadin, osteoporosis, allergies.   PAST SURGICAL HISTORY: Hysterectomy, appendectomy, recent right hip surgery in June 2014 after she sustained a fall.   ALLERGIES:  CECLOR.   PSYCHOSOCIAL HISTORY: Lives alone. Denies smoking, alcohol or illicit drug usage.   Ambulates with the help of a walker.   FAMILY HISTORY: Cancer and strokes run in her family.  HOME MEDICATIONS:  1.  Coumadin 1 mg tablet once daily. 2.  Hydrochlorothiazide/triamterene 25/37.5 mg half tablet once daily. 3.  Colace 100 mg 2 times a day. 4.   1 capsule once daily. 5.  Bisacodyl suppository as needed basis. 6.  Iron supplement.  7.  Calciferol with vitamin D.  REVIEW OF SYSTEMS:   CONSTITUTIONAL: Denies any fever, fatigue.  EYES: Denies any blurry vision or eye pain.  EARS, NOSE, THROAT: No epistaxis or discharge.  RESPIRATORY: Denies cough, COPD. CARDIOVASCULAR: No chest pain or palpitations.  GASTROINTESTINAL: Denies nausea, vomiting, diarrhea.  GENITOURINARY: No dysuria or hematuria.  GYNECOLOGIC AND BREASTS: Denies breast mass or vaginal discharge.  ENDOCRINE:  Denies polyuria, nocturia. HEMATOLOGIC AND LYMPH:  No anemia, multiple bruising on extremities from frequent falls as the patient is on Coumadin.  INTEGUMENTARY: No acne, rash, lesions.  MUSCULOSKELETAL: Complaining of hip pain and shoulder pain on the left side. Denies any gout.  NEUROLOGIC: Denies history of vertigo, ataxia or dementia.  PSYCHIATRIC: Denies any OCD, bipolar disorder.   PHYSICAL EXAMINATION: VITAL SIGNS: Temperature 97.6, pulse 68, respirations 20, blood pressure 143/53, pulse ox 99%. GENERAL APPEARANCE: Not in acute distress. Moderately built and thin-looking female. HEENT: Normocephalic. Pupils are equally reacting to light and accommodation. No scleral icterus. No conjunctival injection. No sinus tenderness. No postnasal drip.  NECK: Supple. No JVD. No thyromegaly. No lymphadenopathy.  LUNGS: Clear  to auscultation bilaterally. No accessory muscle usage.  No anterior chest wall tenderness on palpation.  HEART:  S1 and S2 normal. Regularly irregular.  Positive murmur. ABDOMEN: Soft. Bowel sounds are positive in all 4 quadrants. Nontender, nondistended. No hepatosplenomegaly.   NEUROLOGIC: Awake, alert and oriented x 3. Sensory is intact. Reflexes are 2+. Left upper and left lower extremity motor is limited at 3/5 from fall and pain.  EXTREMITIES: No edema. No cyanosis. No clubbing.  SKIN: Warm to touch. Normal turgor. Multiple bruising is noted on extremities. No lesions.  PSYCHIATRIC: Normal mood and affect.   LABORATORY AND DIAGNOSTICS: CAT scan of the head: No acute findings.   A 12-lead EKG: Normal sinus rhythm, septal infarct of age undetermined.   Glucose 136, BUN 29, creatinine 1.04, sodium 134, potassium 4.1, chloride 99, CO2 32. GFR 47. Anion gap 3, serum osmolality 276, calcium 8.6. Magnesium 1.7. LFTs are normal except albumin which is low at 2.9. CK total 115, CPK-MB 4.8, troponin less than 0.02. PT 33.2. INR 3.4. WBC 7.9, hemoglobin 9.3, hematocrit 26.2, platelets 246. Urinalysis:  Clear in color. Glucose, bili and ketones are negative. Specific gravity 1.004. Nitrite and leukocyte esterase are negative.   ASSESSMENT AND PLAN: A 81 year old Caucasian female presenting to the ER after she sustained 3 falls at home. Will be admitted with the following assessment and plan:  1.  Frequent falls.  Monitor her on telemetry. We will check orthostatics. PT consult for gait evaluation and for lower extremity weakness.  2.  Dehydration. Will provide her IV fluids and check orthostatics.  3.  Chronic atrial fibrillation. Rate controlled on Coumadin. INR is 3.4. Hold Coumadin for now as it is supratherapeutic. Will get daily PTs.  As the patient lives alone and given the history of frequent falls, discontinuation of the Coumadin needs to be further reconsidered by the rounding physician.  4.  History of hypertension. Blood pressure is stable.  Titrate as needed basis.  5.  Provide GI prophylaxis and DVT prophylaxis is not needed as the patient's INR is at 3.4.   She is FULL CODE. Son is her medical power of attorney. The diagnosis and plan of care was discussed in  detail with the patient. Will transfer the patient to Dr. Dan Humphreys, [facility] group, in a.m.   TOTAL TIME SPENT ON ADMISSION: 50 minutes.  ____________________________ Ramonita Lab, MD ag:sb D: 05/15/2013 06:59:21 ET T: 05/15/2013 09:36:08 ET JOB#: [id number]  cc: Ramonita Lab, MD, <Dictator> John B. Danne Harbor, MD Ramonita Lab MD ELECTRONICALLY SIGNED 05/18/2013 7:58

## 2015-01-25 NOTE — Discharge Summary (Signed)
PATIENT NAME:  Kelly Booth, Kelly Booth MR#:  [id number] DATE OF BIRTH:  [birth year]  TRANSFER SUMMARY  DATE OF ADMISSION:  06/21/2013 DATE OF TRANSFER:  06/30/2013   HISTORY OF PRESENT ILLNESS: Ms. Brim is a 81 year old white lady who was a resident at [facility], where she suffered a fall. She presented to the Emergency Room, where she was found to have a left hip fracture. The patient was therefore admitted for orthopedic surgery.   PAST MEDICAL HISTORY: Notable for hypertension, paroxysmal atrial fibrillation, osteoporosis and a history of pulmonary fibrosis.   PAST SURGICAL HISTORY: Included a previous hysterectomy and appendectomy. She had had a right hip fracture in June 2014.   ALLERGIES: THE PATIENT WAS NOTED TO BE ALLERGIC TO CECLOR.   MEDICATIONS AT THE TIME OF ADMISSION: Included: 1. Acetaminophen 650 mg every 6 hours as needed.  2. Calcium plus vitamin D 1 tablet b.i.d.  3. Cardizem CD 120 mg daily.  4. Ferrous sulfate 325 mg daily.  5. Latanoprost eyedrops at bedtime.  6. Vitamin D3 1000 units daily.  7. Zantac 150 mg b.i.d.   ADMISSION PHYSICAL EXAMINATION: Revealed a temperature of 97.9, pulse 81, a blood pressure initially of 212/84. Pulse oximetry was 89% on room air. Exam as described by the admitting physician was notable for a loud systolic murmur. She also had bilateral crackles in the lung bases. The remainder of the exam was basically unremarkable except for the lower extremity deformity from her hip fracture.   DIAGNOSTIC DATA: The patient's EKG showed a normal sinus rhythm with right atrial enlargement and left ventricular hypertrophy. Chest x-ray showed hyperinflation from COPD and pulmonary fibrosis. Admission metabolic panel was notable for a blood sugar of 193. BUN was 25 with a creatinine of 0.9. Electrolytes were normal. Liver function studies were normal. CBC showed a hemoglobin of 11 with a white count of 13,300. Platelet count was 259,000.   HOSPITAL  COURSE: The patient was admitted to the regular medical floor, where she was seen in consultation by orthopedics. She was eventually taken to surgery for an ORIF of the left hip. The patient's hospital course was complicated by the development of acute respiratory distress and hypoxia following surgery. Chest CT was negative for PE. It did, however, suggest volume overload and pulmonary edema. The patient was placed on IV Lasix for several days and showed partial clearing of her chest x-ray. Following diuresis, she was still requiring high-flow oxygen. She was eventually placed on a pulmonary toilet of IV steroids and SVNs. She was eventually bridged to metered-dose inhalers. She will be discharged on a prednisone taper. The patient is currently being transferred back to the [facility] for continued rehab and physical therapy. She will also need skin care as she has developed a sacral ulcer.   DISCHARGE DIAGNOSES:  1. Acute left hip fracture.  2. Acute on chronic respiratory failure secondary to acute systolic heart failure (resolved) and pulmonary fibrosis.   DISCHARGE MEDICATIONS:  1. Extra Strength Tylenol 500 to 1000 mg every 4 hours as needed for pain or fever.  2. Mag-Al Plus XS 30 mL very 6 hours p.r.n. indigestion.  3. Dulcolax suppository 10 mg daily p.r.n. constipation.  4. Diltiazem ER 120 mg daily.  5. Ferrous sulfate 325 mg daily.  6. Latanoprost 0.005% ophthalmic drops 1 drop both eyes at bedtime.  7. Milk of Magnesia 30 mL b.i.d. p.r.n. constipation.  8. Zantac 150 mg b.i.d.  9. Norco 325/5 mg 1 tablet every 4 hours p.r.n.  pain.  10. Combivent 1 puff q.i.d.   11. Metoprolol tartrate 50 mg b.i.d.  12. Diaper dermatitis ointment applied to affected area t.i.d.  13. Imdur 30 mg daily for blood pressure.  14. Prednisone 10 mg 6-day taper.   DISCHARGE DISPOSITION:  1. The patient being discharged on a low-sodium diet.  2. She is also going out on oxygen at 4 liters per minute  by nasal cannula.  3. The patient is [facility] for further physical therapy.  4. It is noted that at this time she is a no code, and her prognosis is guarded.   ____________________________ Letta PateJohn B. Danne HarborWalker III, MD jbw:OSi D: 06/30/2013 12:01:03 ET T: 06/30/2013 12:16:23 ET JOB#: [id number]  cc: Letta PateJohn B. Danne HarborWalker III, MD, <Dictator> Elmo PuttJOHN B WALKER III MD ELECTRONICALLY SIGNED 07/05/2013 6:41

## 2015-01-25 NOTE — Consult Note (Signed)
Brief Consult Note: Diagnosis: Displaced left femoral neck fracture.   Patient was seen by consultant.   Consult note dictated.   Recommend to proceed with surgery or procedure.   Orders entered.   Comments: LOVENOX DISCONTINUED! PLEASE DO NOT ORDER LOVENOX OR HEPARIN PREOPERATIVELY AS IT CAN PREVENT OR DELAY SURGERY.  Anticipate proceeding with left hip hemiarthroplasty tomorrow.  Electronic Signatures: Donato HeinzHooten, James P (MD)  (Signed 17-Sep-14 21:07)  Authored: Brief Consult Note   Last Updated: 17-Sep-14 21:07 by Donato HeinzHooten, James P (MD)

## 2015-01-25 NOTE — Consult Note (Signed)
PATIENT NAME:  Kelly Booth, Kelly Booth MR#:  [id number] DATE OF BIRTH:  09-Apr-1920  DATE OF CONSULTATION:  03/15/2013  REFERRING PHYSICIAN: Leitha Schuller, MD, orthopedics. CONSULTING PHYSICIAN:  Ramonita Lab, MD  PRIMARY CARE PHYSICIAN: John B. Danne Harbor, MD  REASON FOR THE CONSULTATION: Preoperative clearance and medical management.   HISTORY OF PRESENT ILLNESS: The patient is a 81 year old Caucasian female who lives at home by herself, with a significant past medical history of pulmonary fibrosis, chronic hypertension, osteoporosis, chronic atrial fibrillation, on Coumadin, allergic rhinitis, who is unsteady, got tipped over and fell on her hip and fractured her right hip. Also, she has fractured right 6th rib. She was admitted to Dr. Neomia Glass service, and hospitalist team is consulted regarding preop clearance. The patient denies any chest pain or shortness of breath. She has reported that she uses cane to ambulate, but lately she has been wobbly, though she has been using cane. Last night, she fell at around 9:00 p.m. As she does not have a medical alert bracelet, it took her 2 hours approximately to crawl to the nearest telephone and try to reach family members. She could not reach any family members, and then she called ER, and then EMS brought her into the ER. The patient has chronic history of atrial fibrillation and sees Dr. Lady Gary, the cardiologist. She is on Coumadin, and today's INR is at 2.2. The patient is also reporting that she has chronic history of cough, and lately, for the past 2 weeks, she has been coughing more and bringing up yellowish phlegm. White count is elevated at 20.1. The patient denies any fevers or chest pain. Pain is well-controlled as she was given pain medicine in the ER. Her son and grandson were at bedside.   PAST MEDICAL HISTORY: Hypertension, pulmonary fibrosis, chronic atrial fibrillation, on Coumadin, osteoporosis, allergic rhinitis.   PAST SURGICAL HISTORY: Appendectomy,  hysterectomy.   ALLERGIES: SHE IS ALLERGIC TO CECLOR.   SOCIAL HISTORY: Lives alone. No history of smoking, alcohol or illicit drug use.   FAMILY HISTORY: Mother died from complications of cancer, and father died from stroke.   HOME MEDICATIONS:  1. Coumadin 2 mg p.o. once daily.  2. Vitamin D3 1000 units 1 capsule once a day. 3. Singulair 10 mg once daily. 4. Multivitamin once daily.  5. Lisinopril 5 mg once daily.  6. Hydrochlorothiazide/triamterene  once a day.  7. Iron gluconate once daily.  8. Citracal with vitamin D once daily. 9. Cardizem CD 180 mg once daily.  REVIEW OF SYSTEMS: CONSTITUTIONAL: Denies any fever or fatigue. Pain is well controlled. No weight loss or weight gain. EYES: Denies any eye pain, redness or inflammation.  ENT: Denies any epistaxis, discharge or difficulty in swallowing.  RESPIRATORY: Denies any cough, wheezing, hemoptysis, dyspnea.  CARDIOVASCULAR: Denies any chest pain, palpitations or syncope.  GASTROINTESTINAL: Denies nausea, vomiting, diarrhea, hematemesis or melena.  GENITOURINARY: No dysuria, hematuria, renal calculi. GYNECOLOGIC AND BREASTS: Denies any breast mass or vaginal discharge.  ENDOCRINE: Denies any polyuria, nocturia or thyroid problems.  HEMATOLOGIC AND LYMPHATIC: Denies any anemia, bleeding or swollen glands.  INTEGUMENTARY: No acne, rash or lesions.  MUSCULOSKELETAL: Complaining of right hip pain which is manageable after giving pain medicine. Denies any gout. Has chronic history of osteoporosis.  NEUROLOGIC: Denies any vertigo, ataxia, dementia.  PSYCHIATRIC: Denies any ADD, OCD, bipolar disorder.   PHYSICAL EXAMINATION:  VITAL SIGNS: Temperature 97.7, pulse 67, respirations 20, blood pressure 142/52, pulse oximetry 100% on 2 liters.  GENERAL  APPEARANCE: Not under acute distress. Moderately built and thin.  HEENT: Normocephalic. Pupils are equally reacting to light and accommodation. No scleral icterus. No conjunctival  injection. No sinus tenderness. No postnasal drip. No pharyngeal exudates.  NECK: Supple. No JVD. No thyromegaly. LUNGS: Bronchial breath sounds are noticed. No wheezing. Right anterior chest wall tenderness is present on palpation, but no accessory muscle use.  CARDIAC: S1, S2 normal. Regular rate and rhythm. Positive murmur. No edema.  GASTROINTESTINAL: Soft. Bowel sounds are positive in all 4 quadrants. Nontender, nondistended. No masses felt. No hepatosplenomegaly.  NEUROLOGIC: Awake, alert, oriented x3. Motor and sensory are grossly intact. Cranial nerves II through XII are intact. Reflexes are 2+.  EXTREMITIES: Right hip is externally rotated. No edema. No cyanosis. No clubbing.  SKIN: Warm to touch. Normal turgor. No rashes. No lesions noticed.  PSYCHIATRIC: Normal mood and affect.   LABORATORY AND IMAGING STUDIES: Chest x-ray: Chronic interstitial changes were noticed. No acute infiltrates are noticed. A 12-lead EKG: Normal sinus rhythm. Normal PR and QRS intervals. No ST-T wave changes. Glucose 206, BUN 29, creatinine 1.17, sodium 132, potassium 4.0, chloride 95, CO2 31, GFR is 41, anion gap 6, serum osmolality 276, calcium 9.5. LFTs are within normal range except albumin which is low at 3.2. WBC 20.1, hemoglobin 9.8, hematocrit 28.5, and platelets 265. PT 23.7, INR 2.2. Urinalysis: Yellow in color, hazy in appearance, glucose 150 mg/dL, ketones negative, nitrite negative, leukocyte esterase negative, hyaline casts 1 per low-power field. Pelvic x-ray: Right hip fracture is noticed.   ASSESSMENT AND PLAN: A 81 year old female who was brought into the ER after she sustained a fall and diagnosed with right hip and a right rib fracture, was admitted to Dr. Neomia Glass service, and hospitalist team is called for preoperative clearance. The patient and her family prefer Dr. Ernest Pine from Dr. Neomia Glass group for surgery if possible, if not, they are okay with Dr. Rosita Kea. The patient is not medically cleared as INR  is at 2.2, which was discussed with Dr. Rosita Kea, and he is aware.   1. Chronic history of atrial fibrillation, rate controlled. Plan is to continue Cardizem. INR is therapeutic at 2.2. The patient has received vitamin K in the ER. We are holding Coumadin. Repeat PT, INR. Will consider giving FFP prior to surgery if needed based on the repeat INR results. A 2-D echocardiogram is ordered, as the patient is 81 years old and has chronic atrial fibrillation, to evaluate left ventricular ejection fraction. EKG is normal.  2. Hyponatremia, mild, probably from hydrochlorothiazide, which is discontinued, and lisinopril dose is increased to 10 mg once daily.  3. Leukocytosis with 2-week history of acute on chronic cough, possibly from acute bronchitis. The patient is started on IV levofloxacin. The leukocytosis can be also from inflammation from hip fracture.  4. Chronic pulmonary fibrosis and osteoporosis. The patient is not on any bisphosphonates, according to her home medication list, and I am leaving this to her primary care physician, Dr. Yates Decamp III.  5. History of frequent falls with chronic atrial fibrillation and on Coumadin, lives alone. Primary care physician to reconsider continuing Coumadin following the surgery.  6. Will provide her gastrointestinal prophylaxis.  7. Deep vein thrombosis prophylaxis is not needed at this time as INR is therapeutic.   CODE STATUS: She is full code. Her son is medical power of attorney.   The diagnosis and plan of care were discussed with the family, and currently, the patient is not medically optimized for surgery  as INR is at 2.2. The patient will be transferred to Dr. Yates DecampJohn Walker III in a.m.   TOTAL TIME SPENT ON THE CONSULTATION: 45 minutes.   Thank you, Dr. Rosita KeaMenz, for allowing hospitalist team to take care of the patient.   ____________________________ Ramonita LabAruna Marranda Arakelian, MD ag:OSi D: 03/15/2013 04:52:02 ET T: 03/15/2013 06:18:46 ET JOB#: [id number]  cc: Ramonita LabAruna  Sullivan Blasing, MD, <Dictator> Ramonita LabARUNA Baylen Buckner MD ELECTRONICALLY SIGNED 03/17/2013 6:38

## 2015-01-25 NOTE — Discharge Summary (Signed)
PATIENT NAME:  Kelly Booth, Kelly Booth MR#:  [id number] DATE OF BIRTH:  Sep 13, 1920  DATE OF ADMISSION:  05/15/2013 DATE OF DISCHARGE:  05/19/2013  HISTORY OF PRESENT ILLNESS: [name] is a 81 year old white lady who had recently been hospitalized with a hip fracture. She went to rehab but eventually return home where she lived alone, but was being followed by her son. At home she had multiple falls and was brought to the ER by her family. In the ER she was found to have evidence of dehydration. She also was hypercoagulable. The patient had been on Coumadin for chronic atrial fib. The patient was also complaining of left hip pain.   PAST MEDICAL HISTORY: Notable for hypertension, a history of pulmonary fibrosis, history of chronic atrial fibrillation, osteoporosis, and previous hysterectomy and appendectomy. As mentioned above, she had had a right hip fracture repair in 03/2013.   ALLERGIES: CECLOR.   HOME MEDICATIONS:  Included Coumadin 1 mg daily, hydrochlorothiazide triamterene 25/37.5 mg 1/2 tablet daily, Colace 100 mg b.i.d., iron supplement daily and calcium with vitamin D daily.   ADMISSION PHYSICAL EXAMINATION:  Revealed a temperature 97.6 with a pulse of 68, respirations 20, blood pressure 143/53, pulse ox was 99%.  relatively unremarkable with the exception of signs of dehydration. It is noted that on admission her heart rate was described as irregular although throughout her hospitalization. She was in a sinus rhythm.   LABORATORY, DIAGNOSTIC AND RADIOLOGIC DATA: The patient's EKG showed a normal sinus rhythm with an old septal infarct. CT scan of the head in the Emergency Room was unremarkable. Metabolic panel showed a BUN of 29 with a creatinine 1.04. Magnesium was low at 1.7. Albumin was low at 2.9. INR was 3.4. Hemoglobin was 9.3.   HOSPITAL COURSE: The patient was admitted to the regular medical floor where she was placed on telemetry. Serial cardiac enzymes were obtained and were normal.  Because of her multiple falls, her Coumadin was discontinued. The patient's diuretic was also discontinued because of her dehydration. Her magnesium deficiency was corrected with supplements. The patient eventually was placed back on IV calcium channel blocker to control her heart rate. A diuretic was not resumed. The patient was seen in the hospital by physical therapy who recommended that she return to rehab rather than home.   DISCHARGE DIAGNOSES: 1.  Dehydration secondary to diuretics, resolved.  2.  Hypercoagulable state , resolved.  3.  History of chronic atrial fibrillation presently in normal sinus rhythm on Cardizem. Warfarin discontinued due to high risk for falls.  4.  Impaired ambulation. The patient is being sent to rehab for further physical therapy.   DISCHARGE MEDICATIONS: 1.  Latanoprost eyedrops 0.005%, one drop both eyes at bedtime.  2.  Ferrous sulfate extended-release 160 mg daily.  3.  Tylenol 325 to 650 mg every 4 to 6 hours as needed for pain.  4.  Cardizem CD 120 mg daily.  5.  Os-Cal 500 plus vitamin D 200 units 1 tablet b.i.d. with meals.  6.  Vitamin D3 1000 units daily.   DISCHARGE DISPOSITION: The patient is being discharged on a 2 gram sodium diet with activity as tolerated. She will continue to receive physical therapy. It is anticipated she will eventually return home. The patient remains a FULL CODE.    ____________________________ Kelly PateJohn B. Danne HarborWalker III, MD jbw:dp D: 05/19/2013 08:11:49 ET T: 05/19/2013 08:35:01 ET JOB#: [id number]  cc: Kelly RuizJohn B. Danne HarborWalker III, MD, <Dictator> Kelly PuttJOHN B WALKER III MD ELECTRONICALLY SIGNED 05/22/2013  20:16 

## 2015-01-25 NOTE — Op Note (Signed)
PATIENT NAME:  Kelly ShaggyMAUNEY, Dayne C MR#:  [id number] DATE OF BIRTH:  1920-04-07  DATE OF PROCEDURE:  06/22/2013  PREOPERATIVE DIAGNOSIS: Degenerative arthritis and femoral neck fracture, left hip.   POSTOPERATIVE DIAGNOSIS: Degenerative arthritis and femoral neck fracture, left hip.   PROCEDURE: Left total hip replacement, direct anterior approach.   SURGEON: Kennedy BuckerMichael Ravinder Lukehart, M.D.   ASSISTANT: Devota PaceApril Berndt, nurse practitioner.   ANESTHESIA: Spinal.   DESCRIPTION OF PROCEDURE: The patient was brought to the operating room and after adequate spinal anesthesia was obtained, the right leg was placed on a well-padded table. The left leg was placed on the Medacta attachment with appropriate padding. After prepping and draping in the usual sterile fashion, appropriate patient identification and timeout procedures were completed. A direct anterior approach was made just lateral to the medial border of the tensor fascia muscle. The incision was carried down through the skin and subcutaneous tissue, and the tensor fascia opened and retracted laterally going through the deep fascia. The quad rectus fascia was incised and the rectus retracted medially. The anterior capsule was opened and the femoral neck cut carried out, followed by removal of the residual head from the fracture. The fracture was subcapital in nature. There is mild-to-moderate degenerative changes, mild on the femoral head, moderate in the acetabulum. The acetabulum was reamed to 50 mm and a 50 mm Versafit cup DM was impacted into place in the appropriate position with C-arm assisting in position. Next, the leg was externally rotated and the pubofemoral and ischiofemoral ligaments were released to allow for mobilization of the femur. The leg was then dropped into extension. Box osteotome followed by a small curette was used to open the canal and then sequential broaching was carried out to a size 3 AMIS stem, 3 AMIS trials were made off of this and the  short neck gave appropriate length. The three stem was then impacted down the canal with a short head and 50 mm DM liner impacted. After this was placed, the hip was reduced and was stable to a 90 degrees external rotation test. The wound was thoroughly irrigated and the capsule was closed using a heavy quill suture, 2-0 Vicryl subcutaneously and skin staples. Xeroform, 4 x 4's, ABD, and tape applied.   ESTIMATED BLOOD LOSS: 400 mL.   COMPLICATIONS: None.   SPECIMENS: Femoral head and neck.   IMPLANTS: Medacta AMIS 3-collared stem with a Versafit cup DM 50 mm with liner and S 28 mm head.   ____________________________ Leitha SchullerMichael J. Brielynn Sekula, MD mjm:sg D: 06/22/2013 17:55:04 ET T: 06/22/2013 19:23:56 ET JOB#: [id number]  cc: Leitha SchullerMichael J. Casara Perrier, MD, <Dictator> Leitha SchullerMICHAEL J Khadir Roam MD ELECTRONICALLY SIGNED 06/23/2013 7:57

## 2015-01-25 NOTE — Consult Note (Signed)
Brief Consult Note: Diagnosis: AF, stable, low risk for CV complication during right hip surgery.   Patient was seen by consultant.   Consult note dictated.   Comments: REC  Agree with current therapy, defer further cardiac diagnostics at this time, proceed with surgery as planned.  Electronic Signatures: Marcina MillardParaschos, Tyshon Fanning (MD)  (Signed 11-Jun-14 15:55)  Authored: Brief Consult Note   Last Updated: 11-Jun-14 15:55 by Marcina MillardParaschos, Allisha Harter (MD)

## 2015-01-26 NOTE — H&P (Signed)
PATIENT NAME:  Kelly Booth, Kelly Booth MR#:  [id number] DATE OF BIRTH:  April 16, 1920  DATE OF ADMISSION:  10/14/2013  PRIMARY CARE PHYSICIAN: John B. Danne HarborWalker III, MD  REFERRING PHYSICIAN: Charlestine NightPhillip A. Scotty CourtStafford, MD  CHIEF COMPLAINT: Shortness of breath.   HISTORY OF PRESENT ILLNESS: The patient is a 81 year old Caucasian female with past medical history of pulmonary fibrosis, chronic atrial fibrillation, who was just recently admitted to the hospital on January 6th for possible pneumonia and treated with levofloxacin. The patient was eventually discharged home yesterday, on January 9th, to a skilled nursing facility. The patient became extremely short of breath associated with hypoxia a few hours ago, and EMS was called, and the patient was brought into the ER. The patient was initially placed on CPAP by the EMS, and after coming to the ER, she was placed on BiPAP. ABG has revealed a pH of 7.27 with a pCO2 of 67, pO2 77. The patient was placed on BiPAP in the ER. Chest x-ray has revealed pleural effusions. Pleural effusions were assumed to be parapneumonic, and she was given first dose of IV Zosyn and Levaquin after obtaining cultures. Vancomycin was also ordered by the primary care physician. The patient has received Solu-Medrol IV by the EMS. The patient is resting comfortably on BiPAP during my examination and feeling tired as it is 4:30 in the morning during my examination. As reported by the son, the patient denied any chest pain or abdominal pain.   PAST MEDICAL HISTORY:  1. Hypertension.  2. Pulmonary fibrosis. 3. Paroxysmal atrial fibrillation. 4. Osteoporosis. 5. Allergies.   PAST SURGICAL HISTORY: 1. Hysterectomy. 2. Right hip surgery in June 2014 and left hip surgery in September 2014.  3. Appendectomy.   ALLERGIES: CECLOR.   PSYCHOSOCIAL HISTORY: Never smoked in her life. Denies alcohol. [facility]   FAMILY HISTORY: Cancer and stroke in multiple family  members.   HOME MEDICATIONS:  1. Zantac 150 mg 2 times a day.  2. Vitamin D3 1000 units 1 capsule p.o. once daily.  3. Milk of Magnesia 30 mL p.o. 2 times a day.  4. Metoprolol succinate 50 mg p.o. 2 times a day.  5. Levofloxacin 750 mg p.o. q.48 hours. 6. Isosorbide mononitrate 1 tablet p.o. once daily. 7. Iron sulfate 325 mg p.o. once daily. 8. Diltiazem 120 mg 1 capsule p.o. once daily.  9. Tylenol 1 tablet p.o. q.4 hours.  10. Bisacodyl 5 mg 2 tablets p.o. once daily.   REVIEW OF SYSTEMS: Unobtainable, as the patient is very lethargic and tired and resting comfortably on BiPAP.   PHYSICAL EXAMINATION:  VITAL SIGNS: Temperature 97.5, pulse 58, respirations 22, blood pressure is 174/77, pulse oximetry 100% on BiPAP.  HEENT: Normocephalic, atraumatic. Pupils are equally reacting to light and accommodation. No scleral icterus. No conjunctival injection. No sinus tenderness. Moist mucous membranes. The patient is on BiPAP.  NECK: Supple. No JVD. No thyromegaly.  LUNGS: With decreased air entry, basilar atelectasis. No accessory muscle usage. No anterior chest wall tenderness on palpation.  CARDIOVASCULAR: S1 and S2 normal. Regular rate and rhythm. GASTROINTESTINAL: Soft. Bowel sounds are positive in all 4 quadrants. Nontender, nondistended. No hepatosplenomegaly. No masses felt.  NEUROLOGIC: Arousable, but very lethargic and falling asleep, tired, on BiPAP machine.  tired. Motor and sensory are grossly intact. Reflexes are 2+.  EXTREMITIES: Trace edema is present. No cyanosis. No clubbing.  PSYCHIATRIC: Mood and affect could not be elicited as the patient is lethargic and on BiPAP  machine.   LABORATORY AND IMAGING STUDIES: CHEM-8:sodium 139, potassium is 3.3, chloride is 100,  anion gap is 3, serum calcium is 8.7. Troponin less than 0.02. WBC 19.1, hemoglobin is 13.5, hematocrit 39.8, platelets are 265. Flu test is negative. ABG: pH 7.27, pCO2 is 67, pO2 is 77, base excess is 2, FiO2 100%.  Chest x-ray, portable view, has revealed small to moderate bilateral pleural effusions, greater on the right, associated with basilar atelectasis. The patient's right effusion appears slightly increased since the prior study.   ASSESSMENT AND PLAN: A 81 year old do-not-resuscitate patient who was just discharged home yesterday to a skilled nursing facility, coming back again with shortness of breath. Will be admitted with the following assessment and plan:   1. Acute hypoxic respiratory failure, probably from parapneumonic effusion versus congestive heart failure with fluid overloaded. Will admit the patient to telemetry bed. The patient will continue BiPAP. Lasix 40 mg IV will be given. Pan-cultures were obtained, and the patient will be on empiric antibiotics with Zosyn, Levaquin and vancomycin. I will get echocardiogram.  2.. Hypertension. Resume her home medications.  3.. Chronic atrial fibrillation, rate controlled. Not on any anticoagulants in view of frequent falls. 4.. Pulmonary fibrosis.  CODE STATUS: The patient is DNR. Son is the medical power of attorney.   Diagnosis and plan of care were discussed in detail with the patient and son at bedside. They both verbalized understanding of the plan.   Will transfer the patient to just [facility], Dr. Tedra Coupe service in a.m.   TOTAL TIME SPENT ON ADMISSION: 60 minutes.  ____________________________ Ramonita Lab, MD ag:lb D: 10/14/2013 05:46:25 ET T: 10/14/2013 40:98:11 ET JOB#: [id number]  cc: Ramonita Lab, MD, <Dictator> John B. Danne Harbor, MD Ramonita Lab MD ELECTRONICALLY SIGNED 10/16/2013 3:34

## 2015-01-26 NOTE — Consult Note (Signed)
PATIENT NAME:  Kelly Booth, Kelly Booth MR#:  [id number] DATE OF BIRTH:  [birth year]  CARDIOLOGY CONSULTATION   DATE OF CONSULTATION:  10/14/2013  REFERRING PHYSICIAN:  John B. Danne Harbor, MD CONSULTING PHYSICIAN:  Marcina Millard, MD  PRIMARY CARE PHYSICIAN: John B. Danne Harbor, MD  CHIEF COMPLAINT: Shortness of breath.   REASON FOR CONSULTATION: Consultation requested for evaluation of congestive heart failure.   HISTORY OF PRESENT ILLNESS: The patient is a 81 year old female with history of pulmonary fibrosis, chronic atrial fibrillation and pneumonia. The patient was recently hospitalized 10/10/2013 with pneumonia, treated with levofloxacin for 4 days inpatient. The patient was discharged back to assisted living yesterday, only to return 12 hours later with decompensation, acute shortness of breath and hypoxia. In the Emergency Room, arterial blood gas revealed pH of 7.27, pCO2 of 67, pO2 of 77. The patient was treated with BiPAP. Chest x-ray revealed bilateral pleural effusions, assumed to be parapneumonic, and the patient was again restarted on antibiotic therapy with intravenous Zosyn and Levaquin. The patient also received intravenous Solu-Medrol. Admission labs were notable for negative troponin of 0.02 and a white count of 19,100.    PAST MEDICAL HISTORY:  1. Pulmonary fibrosis.  2. Paroxysmal atrial fibrillation.  3. Hypertension.  4. Osteoporosis.   HOME MEDICATIONS:  1. Metoprolol succinate 50 mg b.i.d. 2. Isosorbide mononitrate 1 daily.  3. Diltiazem 120 mg daily.  4. Zantac 150 mg b.i.d. 5. Vitamin D3 1000 units 1 capsule daily.  6. Milk of Magnesia 30 mL b.i.d. 7. Levofloxacin 750 mg q.48 hours.  8. Iron sulfate 325 mg daily.  9. Tylenol 1 q.4 hours.   SOCIAL HISTORY: The patient currently lives at assisted living in [place]. She denies tobacco abuse.   FAMILY HISTORY: No immediate family history for coronary artery disease or myocardial infarction.   REVIEW OF  SYSTEMS:  CONSTITUTIONAL: No fever or chills.  EYES: No blurry vision.  EARS: No hearing loss.  RESPIRATORY: Shortness of breath as described above.  CARDIOVASCULAR: No chest pain.  GASTROINTESTINAL: No nausea, vomiting, diarrhea or constipation.  GENITOURINARY: No dysuria or hematuria.  ENDOCRINE: No polyuria or polydipsia.  MUSCULOSKELETAL: No arthralgias or myalgias.  NEUROLOGICAL: No focal muscle weakness or numbness.  PSYCHOLOGICAL: No depression or anxiety.   PHYSICAL EXAMINATION:  VITAL SIGNS: Blood pressure 184/75, pulse 64, temperature 97.9, respirations 16, oximetry  94%.  GENERAL: The patient is an elderly female in moderate respiratory distress, lying flat in bed.  HEENT: Pupils equally reactive to light and accommodation.  NECK: Supple without thyromegaly.  LUNGS: Revealed diffuse expiratory wheezes with fine crackles.  CARDIOVASCULAR: Normal JVP. Normal PMI. Irregularly irregular rhythm. Normal S1, S2. No appreciable gallop, murmur or rub.  ABDOMEN: Soft and nontender without hepatosplenomegaly.  EXTREMITIES: No cyanosis, clubbing or edema. Pulses were intact bilaterally.  MUSCULOSKELETAL: Normal muscle tone.  NEUROLOGICAL: The patient is alert and oriented x3. The patient has very poor insight into her medical condition.   IMPRESSION: A 81 year old female with known history of pulmonary fibrosis, currently a resident in assisted living, recently hospitalized for pneumonia, returns with hypoxia, with signs and symptoms consistent with refractory pneumonia. The patient has known normal left ventricular function with left ventricular ejection fraction of 60% to 65% by echocardiogram 03/15/2013, currently without chest pain with negative troponin.   RECOMMENDATIONS:  1. Agree with overall current therapy.  2. Would defer full-dose anticoagulation.  3. Continue diuresis for now, carefully monitor BUN and creatinine, avoid overdiuresis and dehydration.  4. Defer further  noninvasive or invasive cardiac evaluation at this time.    ____________________________ Marcina MillardAlexander Saurav Crumble, MD ap:lb D: 10/14/2013 09:07:34 ET T: 10/14/2013 09:24:26 ET JOB#: [id number]  cc: Marcina MillardAlexander Zeda Gangwer, MD, <Dictator> Marcina MillardALEXANDER Padraig Nhan MD ELECTRONICALLY SIGNED 11/21/2013 12:27

## 2015-01-26 NOTE — Discharge Summary (Signed)
PATIENT NAME:  Kelly Booth, Kelly Booth MR#:  [id number] DATE OF BIRTH:  1920-08-02  DATE OF ADMISSION:  10/14/2013 DATE OF DISCHARGE:  10/20/2013  HISTORY OF PRESENT ILLNESS: Kelly Booth is a 81 year old white lady, who had just been hospitalized with an upper respiratory tract infection and respiratory failure secondary to pulmonary fibrosis. She had been discharged to a skilled nursing facility, but in less than 48 hours she was sent back because of increasing shortness of breath and hypoxia. In the Emergency Room, the patient was found to have marked CO2 retention and to be acidotic. She was placed on BiPAP in the ER. She was evaluated by the hospitalist and admitted for further treatment of her CO2 retention and respiratory failure.   PAST MEDICAL HISTORY: Notable for hypertension, pulmonary fibrosis, paroxysmal atrial fibrillation, osteoporosis and seasonal allergies.   PAST SURGICAL HISTORY: Included a hysterectomy and appendectomy. She had had bilateral hip surgery.   ALLERGIES: The patient was noted to be allergic to CECLOR.   MEDICATIONS AT THE TIME OF ADMISSION: Included: Zantac 150 mg b.i.d.,  Vitamin D3 1000 units daily, milk of magnesia 30 mL b.i.d., metoprolol succinate 50 mg b.i.d., Levaquin 750 mg every 8 hours, Imdur 1 daily, iron sulfate 325 mg daily, diltiazem 120 mg 1 capsule daily and Tylenol 1 tablet every 4 hours as needed.   ADMISSION PHYSICAL EXAMINATION: Revealed a temperature 97.5, pulse 58, respirations 22, blood pressure 174/77 and pulse oximetry was 100% on BiPAP. Admission exam as described by the admitting physician revealed an elderly lady, who appeared both acutely and chronically ill. She had decreased air flow bilaterally. She however, was not using the associate muscles of respiration. Rate was described as regular. She was arousable, but very lethargic. Neurologic exam was grossly physiological.   Admission basic metabolic panel showed a sodium of 139, potassium of 3.3,  chloride of 100, anion gap of 3 and a calcium of 8.7. Troponin was normal. White count was 19,100. Hemoglobin was 13.5. Hematocrit 39.8. Platelets 265,000. Influenza screen was negative. Blood gas on admission showed a pH of 7.27 with pCO2 of 67 and a pO2 of 77. Chest x-ray showed mild to moderate bilateral pleural effusions being greater on the right. There was bibasilar atelectasis. Right pleural effusion was chronic, but appeared to be increased since the prior study.   HOSPITAL COURSE: The patient was admitted to the regular floor where she was placed on telemetry. She was felt to have acute respiratory failure secondary to parapneumonic effusion versus congestive heart failure. She was placed on Zosyn, Levaquin and vancomycin. She was also given Lasix for her probable heart failure. She was seen in consultation by both cardiology and palliative care. Admission EKG showed atrial fibrillation with evidence of an old anterior septal infarct. Echocardiogram showed an ejection fraction of 60% to 65%. There was diastolic dysfunction. There was left ventricular hypertrophy. There was moderate to severe aortic valve stenosis.   Cultures drawn on admission eventually showed no growth and the patient's antibiotics were discontinued. She was continued on diuretics for her congestive failure and had a fairly good output. Her respiratory condition improved to the point that she did not appear to be retaining CO2. She was not felt to be a good candidate for aortic valve surgery. She was not on anticoagulation for her atrial fibrillation because of a history of falls. The patient was seen and evaluated by physical therapy during her hospitalization and was noted to be frail. The patient is going to be going  back to her assisted living facility, but will need intensive supportive care.   DISCHARGE DIAGNOSES:  1.  Acute respiratory failure with CO2 retention.  2.  Acute on chronic diastolic congestive heart failure.   3.  Pulmonary fibrosis with recent pneumonia.  4.  Aortic stenosis.   DISCHARGE MEDICATIONS:  1.  Vitamin D3 1000 units daily.  2.  Calcium with vitamin D 500/200, 1 tablet b.i.d.  3.  Acetaminophen 325 mg 1 to 2 tablets every 4 to 6 hours as needed for pain or fever.  4.  Zantac 150 mg b.i.d.  5.  Ferrous sulfate 325 mg daily.  6.  Imdur 30 mg daily.  7.  Metoprolol succinate 50 mg b.i.d.  8.  Combivent 1 puff 4 times a day.  9.  Milk of magnesia 30 mL b.i.d. p.r.n.  10. Bisacodyl 5 mg 2 tablets once a day.  11. Aspirin 81 mg daily.  12. Amlodipine 5 mg daily.  13. Furosemide 40 mg daily.  14. Potassium chloride 10 mEq daily.  15. Norco 325/5 mg 1 tablet every 4 hours as needed for pain.   DISCONTINUED MEDICATIONS: Include diltiazem.   DISCHARGE DISPOSITION: The patient was discharged on a low sodium diet with activity as tolerated. She was discharged on home oxygen at 2 L/min by nasal cannula. The patient was discharged back to the assisted care facility with hospice.   The patient is to keep her regular scheduled appointment that is upcoming in the office. ____________________________ Letta Pate. Danne Harbor, MD jbw:aw D: 11/07/2013 06:03:45 ET T: 11/07/2013 09:03:18 ET JOB#: [id number]  cc: Jonny Ruiz B. Danne Harbor, MD, <Dictator> Elmo Putt III MD ELECTRONICALLY SIGNED 11/10/2013 15:19

## 2015-01-26 NOTE — H&P (Signed)
PATIENT NAME:  Kelly Booth, SKEENS MR#:  [id number] DATE OF BIRTH:  05/03/1920  DATE OF ADMISSION:  10/10/2013  CHIEF COMPLAINT: Altered mental status.   HISTORY OF PRESENT ILLNESS: A 81 year old, who is a resident of [facility] assisted living, who presents to the clinic with increasing confusion over the last 24 hours. She is a resident who has suffered a hip fracture and repair in June of her right hip and then falling again in September and had subsequent ORIF of the left hip. She was at rehab at [facility] and then has more recently settled at [facility] assisted living. She has been independent with a walker; however, her son reports a noticeable cough over the last several days and even the last 24 hours it has worsened with some confusion. In the office, the patient is lethargic and weak appearing. She has been having low-grade fevers. No nausea or vomiting. She denied any chest pain.   PAST MEDICAL HISTORY:  1.  Hypertension.  2.  Pulmonary fibrosis.  3.  Paroxysmal atrial fibrillation.  4.  Osteoporosis.  5.  Allergies.   PAST SURGICAL HISTORY:  1.  Hysterectomy.  2.  Appendectomy.  3.  Right hip surgery in June 2014 and left hip surgery in September 2014.   ALLERGIES: CECLOR.   SOCIAL HISTORY: Lifelong nonsmoker. No alcohol. Currently a resident at [facility] assisted living.   FAMILY HISTORY: Cancer and strokes in multiple family members.   CURRENT MEDICATIONS:  1.  Diltiazem 120 mg daily.  2.  Metoprolol 50 mg twice a day.  3.  Imdur 30 mg daily.  4.  Senna S 1 tablet twice a day.  5.  Latanoprost 0.005% 1 drop to both eyes at bedtime.  6.  Iron 325 mg 1 tablet twice a day.  7.  Zantac 150 mg twice a day.  8.  Norco 5/325, 1 tablet every 4 hours as needed.  9.  Tylenol 500 mg 1 to 2 tablets every 4 hours as needed.   REVIEW OF SYSTEMS: She denies any ear pain or sore throat. She has had a mild headache. She has had decreased appetite. She does report some occasional  eye pain. No chest pain. Her cough has been deep per patient and son. No abdominal pain or nausea. She has decreased appetite. No diarrhea. No new rash. She has not been requiring much pain medicine for her hip surgeries. She does complain of some mild neck pain.   PHYSICAL EXAMINATION:  VITAL SIGNS: Her weight is 103, her pressure is 122/62, pulse is 69 and her O2 saturation is 91% on room air.  HEENT: Head is normocephalic, atraumatic. Pupils equal, round and reactive to light. EOMs are intact. Noninjected conjunctivae. Her ears are clear with normal landmarks. No redness. Her oropharynx is clear. Her dentition is intact and her oropharynx is without redness.  NECK: Supple. No adenopathy. No thyromegaly.  LUNGS: Diminished with scattered rales.  HEART: Regular rhythm with a 2 out of 6 systolic murmur.  ABDOMEN: Soft, nontender.  EXTREMITIES: Showing 2+ pitting edema with distal pulses intact.  MUSCULOSKELETAL: Shows kyphosis of the back, severe with mild point tenderness over the cervical spine and paracervical muscles.  NEUROLOGIC: Alert and oriented x 2. Pleasant. No focal weakness.   ASSESSMENT AND PLAN:  1.  Cough and altered mental status. Concern for pneumonia. Obtain chest x-ray and begin intravenous fluids, normal saline at 75 an hour. We will start Levaquin 500 daily. Monitor CBC, MET-C and urinalysis.  2.  History of atrial fibrillation. Continue metoprolol and diltiazem. She is currently off anticoagulations because of her risk for falls.  3.  Hypertension. Continue Lopressor, metoprolol and Imdur. 4.  Pulmonary fibrosis monitoring for pneumonia. Start O2 at 2 L. Treatment as per #1.  Duo-Nebs q.6 hours.     ____________________________ Marily Lente. Arrington Bencomo, PA-C rjt:aw D: 10/10/2013 12:06:00 ET T: 10/10/2013 12:24:22 ET JOB#: [id number]  cc: Marily Lente. Lolita Lenz, <Dictator>  Leonard Downing PA ELECTRONICALLY SIGNED 10/11/2013 9:27

## 2015-01-26 NOTE — Discharge Summary (Signed)
PATIENT NAME:  Kelly Booth, Kelly Booth MR#:  [id number] DATE OF BIRTH:  14-Jan-1920  DATE OF ADMISSION:  10/10/2013 DATE OF DISCHARGE:  10/13/2013  HISTORY OF PRESENT ILLNESS: [name] was a 81 year old white lady, who had been a resident of [facility] assisted living, who presented to the clinic on the day of admission with increasing confusion over the last 24 hours. She had been independent with a walker; however, her son had noticed a cough for the last several days that had gotten worse within the past 24 hours associated with confusion. She was having a low-grade fever. She denied chest pain or shortness of breath.   PAST MEDICAL HISTORY: Notable for hypertension, pulmonary fibrosis, paroxysmal atrial fibrillation, osteoporosis and seasonal allergy.   PAST SURGICAL HISTORY: Included a previous hysterectomy and appendectomy. She had also had a right hip repair in June 2014 with a left hip repair in September 2014.   ALLERGIES: CECLOR.   MEDICATIONS ON ADMISSION: Included diltiazem ER 120 mg daily, metoprolol 50 mg b.i.d., Imdur was 30 mg daily, latanoprost 1 drop both eyes at bedtime, iron 325 mg twice a day, Zantac 150 mg twice a day, Norco 5/325, 1 tablet every 4 hours as needed and Tylenol 500 mg 1 to 2 tablets every 4 hours as needed.   ADMISSION PHYSICAL EXAMINATION: Revealed a weight of 103 pounds, blood pressure 122/62, pulse 69 and pulse oximetry is 91% on room air. Examination as described by the admitting physician was notable for diminished lung sounds bilaterally with scattered rales. She did have regular rhythm with a grade 2 over 6 systolic murmur. Extremities showed 2+ pitting edema.   LABORATORY, DIAGNOSTIC AND RADIOLOGIC DATA: Admission CBC showed a hemoglobin of 11.3 with an hematocrit of 33.5. White count was 7500. Platelet count was 181,000. Admission comprehensive metabolic panel showed a BUN of 24 with a creatinine of 0.97. Albumin was 2.9. Urinalysis showed 1+ blood on the  dipstick and 30 mg/dL of protein. There was 2+ leukocyte esterase. Microscopic showed 58 WBCs per high-power field. Urine culture grew out 400 colonies of a gram-positive cocci. They could not be identified. Admission chest x-ray showed a chronic right pleural effusion. There was chronic interstitial edema. They were unable to decide whether it was due to her pulmonary fibrosis or congestive heart failure. Subsequent chest CT showed a moderate right pleural effusion that was not changed. There was a left pleural effusion that was decreased in size compared to previous studies. There was bibasilar consolidation in the lower lung fields. There was advanced underlying emphysematous changes. There was parenchymal scarring in both upper lobes and apices. Congestive heart failure was not mentioned. An incidental finding was a T3 compression fracture.   HOSPITAL COURSE: The patient was admitted to the regular medical floor where she was treated for suspected pulmonary infection superimposed on her pulmonary fibrosis. She was placed on a pulmonary toilet of IV antibiotics and SVNs. Within 24 hours, her mental status improved. She had had a low-grade temperature at the time of admission, but was afebrile following being placed on IV Levaquin. The patient received physical therapy while in the hospital. She was eventually bridged to p.o. medications. Her pulse oximetry at the time of discharge was 91% on room air.   DISCHARGE DIAGNOSES:  1.  Metabolic encephalopathy due to respiratory infection, pneumonia suspected.  2.  Pulmonary fibrosis.  3.  Chronic right pleural effusion.   DISCHARGE MEDICATIONS:  1.  Vitamin D 1000 units daily.  2.  Latanoprost  0.005 ophthalmic solution 1 drop to each eye at bedtime.  3.  Calcium 500 mg with vitamin D 1 tablet twice a day.  4.  Acetaminophen 325 mg 1 to 2 tablets every 4 to 6 hours as needed for pain or fever.  5.  Zantac 150 mg twice a day.  6.  Diltiazem 120 mg  extended-release 1 tablet daily.  7.  Ferrous sulfate 325 mg daily.  8.  Levaquin 750 mg every 48 hours for 8 days.  9.  Norco 325/5 mg 1 tablet every 4 hours as needed for pain.  10. Imdur 30 mg daily.  11. Metoprolol succinate 50 mg b.i.d.   DISCHARGE DISPOSITION: The patient was discharged on a low sodium diet with activity as tolerated. She did not require home oxygen. She is a no code. She will be followed up in the office in 1 to 2 weeks.  ____________________________ Letta Pate Danne Harbor, MD jbw:aw D: 10/30/2013 07:51:06 ET T: 10/30/2013 08:05:45 ET JOB#: [id number]  cc: Jonny Ruiz B. Danne Harbor, MD, <Dictator> Elmo Putt III MD ELECTRONICALLY SIGNED 10/30/2013 18:44

## 2015-01-27 NOTE — Discharge Summary (Signed)
PATIENT NAME:  Kelly Booth, Kelly Booth MR#:  [id number] DATE OF BIRTH:  15-Mar-1920  DATE OF ADMISSION:  01/18/2012 DATE OF DISCHARGE:  01/21/2012  HISTORY OF PRESENT ILLNESS: [name] is an 81 year old white lady who lived at home by herself and unfortunately fell down a flight of stairs on the morning of admission. She was brought to the Emergency Room where she was found to have multiple rib fractures, fracture of the transverse process of T6-T9 and a right inferior ramus fracture. The patient was admitted for pain control.   PAST MEDICAL HISTORY:  1. Hypertension. 2. Pulmonary fibrosis.  3. Allergic rhinitis. 4. Osteoporosis. 5. History of atrial fibrillation.   PAST SURGICAL HISTORY:  1. Previous appendectomy.  2. Hysterectomy.   ALLERGIES: Patient was notably allergic to Ceclor.   ADMISSION MEDICATIONS:  1. Diltiazem CD 180 mg daily. 2. Lisinopril 5 mg daily.  3. Triamterene hydrochlorothiazide 37/25 mg 1/2 tablet daily.  4. Singulair 10 mg daily.  5. Warfarin 2.5 mg daily.  6. Ferrous sulfate 1 tablet daily.  7. Vitamin D3 1000 units daily.  8. Citracal Plus 3 tablets daily.  9. Latanoprost eyedrops 0.005%, one drop both eyes at bedtime.   PHYSICAL EXAMINATION: Patient's admission vital signs revealed temperature 98, pulse 70, respirations 22, and blood pressure 146/49. Pulse oximetry 97% on room air. Admission physical examination as described by the admitting physician was notable for multiple contusions. Also of note was the fact that she was in a sinus rhythm with a grade 1 to 2/6 systolic ejection murmur. She did have trace to 1+ edema. Neurological exam was unremarkable.   LABORATORY, DIAGNOSTIC AND RADIOLOGIC DATA: The patient's admission CBC showed a hemoglobin of 10.7 with a hematocrit of 31. It did drop during her hospitalization down to 9.3 where it basically stabilized. Admission basic metabolic panel showed a random blood sugar of 109, a BUN of 26, chloride of 97, and an  estimated GFR of 57 but was otherwise unremarkable. Routine radiographs of the right hip showed a nondisplaced fracture of the right inferior pubic ramus. CT scan of the chest showed nondisplaced fractures of the right posterior lateral ribs. There were also nondisplaced thoracic spine transverse process fractures. Old left rib fractures were noted. There were changes consistent with chronic obstructive pulmonary disease.   HOSPITAL COURSE: The patient was admitted to the regular floor where initially she was placed on bed rest and telemetry. She was eventually seen by physical therapy and at the time of discharge was showing rather marked improvement. The patient, however, lived in a three-story home and had no one who can stay with her on a regular basis although she has family in town. For that reason the patient is being transferred to a rehab unit for what is expected be a short-term stay.   DISCHARGE DIAGNOSES:  1. Pelvic fracture.  2. Multiple rib fractures.  3. Fractures of the transverse processes of multiple thoracic spine.   DISCHARGE DISPOSITION: The patient is being discharged on a regular diet and her preadmission medications without change. She had been on some Norco during the hospitalization but at the time of discharge seemed to be doing well on Tylenol alone. The patient will be continued on Coumadin and should have a pro time checked daily. At some point she should have a hemoglobin and hematocrit rechecked if she stays more than a week. The patient remains a FULL CODE. It is anticipated that she will eventually return home with home health and physical therapy.  ____________________________ Letta Pate Danne Harbor, MD jbw:cms D: 01/21/2012 08:15:05 ET T: 01/21/2012 08:38:25 ET  JOB#: [id number] cc: Letta Pate. Danne Harbor, MD, <Dictator> Elmo Putt III MD ELECTRONICALLY SIGNED 01/24/2012 17:30

## 2015-01-27 NOTE — Consult Note (Signed)
Brief Consult Note: Diagnosis: Pubic ramus fracture , transverse process fractures.   Patient was seen by consultant.   Comments: Recommend: Physical Therapy-activity as tolerated Miacalcin spray Treat symptomatically.  Electronic Signatures: Celesta Gentilealiff, Claritza July C (MD)  (Signed 15-Apr-13 18:01)  Authored: Brief Consult Note   Last Updated: 15-Apr-13 18:01 by Celesta Gentilealiff, Aldrich Lloyd C (MD)

## 2015-01-27 NOTE — H&P (Signed)
PATIENT NAME:  Kelly Booth, Kelly Booth MR#:  [id number] DATE OF BIRTH:  11/13/1919  DATE OF ADMISSION:  01/18/2012  REFERRING PHYSICIAN: Dr. Manson Passey PRIMARY CARE PHYSICIAN: Dr. Yates Decamp    CHIEF COMPLAINT: Status post fall.   HISTORY OF PRESENT ILLNESS: This is an 81 year old female who is home by herself with significant past medical history of hypertension, pulmonary fibrosis, atrial fibrillation on anticoagulation who was at home where she was going down the stairs and she lost her balance and she fell on her right side. Soon thereafter she developed right chest tenderness and pelvic area pain as well. Patient denies any trauma, any loss of consciousness, any altered mental status, any dizziness, lightheadedness or palpitations and she reports mainly that she lost her balance while was she going down the stairs. In ED patient had multiple imaging done where she had CT chest done which did show nondisplaced fracture of the posterolateral right sixth and seventh ribs, a nondisplaced fracture of T6-T9 right transverse process and the x-ray did show right inferior ramus pelvic fracture so hospitalist service were requested to admit the patient for pain control and physical therapy.  PAST MEDICAL HISTORY: 1. Hypertension.  2. Pulmonary fibrosis.  3. Allergic rhinitis.  4. Osteoporosis.  5. Atrial fibrillation.   PAST SURGICAL HISTORY:  1. Appendectomy. 2. Hysterectomy.   ALLERGIES: Ceclor.   FAMILY HISTORY: Mother died from complication of cancer. Father died from stroke.   SOCIAL HISTORY: No smoking. No alcohol abuse. No illicit drug use. She lives at home by herself.   HOME MEDICATION:  1. Diltiazem CD 180 mg daily.  2. Lisinopril 5 mg oral daily. 3. Triamterene/hydrochlorothiazide 37/25, 1/2 tablet daily.  4. Montelukast 10 mg daily.  5. Warfarin 2.5 mg daily.  6. Latanoprost 0.005% one drop both eyes at bedtime.  7. Vitamin D3 1000 international units daily. 8. Citracal Plus D 3 tablets  a day.  9. Ferrous sulfate 1 tablet daily.   REVIEW OF SYSTEMS: CONSTITUTIONAL: Patient denies any fever, fatigue, weakness EYES: Denies any blurry vision, double vision, pain, redness. ENT: Denies any tinnitus, ear pain, hearing loss. RESPIRATORY: Denies any cough, wheezing, hemoptysis, dyspnea. CARDIOVASCULAR: Denies any chest pain, orthopnea. Has worsening lower extremity edema. Denies any palpitations, syncope. GASTROINTESTINAL: Denies any nausea, vomiting, diarrhea, abdominal pain, hematemesis. GENITOURINARY: Denies any dysuria, hematuria, renal colic. ENDOCRINE: Denies any polyuria, polydipsia, heat or cold intolerance. HEMATOLOGY: Denies any bleeding or history of blood clots, on warfarin for atrial fibrillation. MUSCULOSKELETAL: Has pain and has right chest tenderness to palpation and deep breath as well as right hip area pain. NEUROLOGICAL: Has complaint of osteoarthritis and chronic joint pain. Denies any vertigo, ataxia, migraine. PSYCH: Denies any insomnia.  PHYSICAL EXAMINATION: VITAL SIGNS: Temperature 98, pulse 70, respiratory 22, blood pressure 156/49, saturating 97% on room air.   GENERAL: Elderly female who is comfortable in bed in no apparent distress.   HEENT: Head atraumatic, normocephalic. Pupils equal, reactive to light. Pink conjunctiva. Anicteric sclera. Moist oral mucosa.   NECK: Supple. No thyromegaly. No JVD.   CHEST: Good air entry bilaterally. No wheezing, rales, rhonchi.   CARDIOVASCULAR: S1, S2 heard. Mild systolic ejection murmur, regular rate and rhythm.  ABDOMEN: Soft, nontender, nondistended. Bowel sounds present.   EXTREMITIES: +1 edema bilaterally. No clubbing. No cyanosis.   MUSCULOSKELETAL: Has chest tenderness to palpation on the right side.   PSYCHIATRIC: Appropriate affect. Awake, alert x3. Intact judgment and insight.   LABORATORY, DIAGNOSTIC AND RADIOLOGICAL DATA: Glucose 109, BUN 26, creatinine  0.9, sodium 137, potassium 4.7, chloride 97, CO2  32, anion gap 8, calcium 9.2, white blood cell 9.9, hemoglobin 11.7, hematocrit 31, platelets 241, INR 2.1.   CT chest showing multiple old left rib fracture  and suspected old left clavicle fracture. There are acute or recent appearing nondistended fracture of the posterolateral right sixth and seventh ribs and there are nondisplaced fracture of the T6-T9 right transverse process. No pneumothorax. Biapical scarring.   ASSESSMENT AND PLAN:  1. Gait disturbance and instability status post mechanical fall with multiple fractures. Will admit patient to medical floor for pain control. Will start her on morphine 2 mg every 3 to 4 hour as needed for pain control. Will consult physical therapy services and will consult orthopedic service for patient's right inferior pubic ramus fracture and transverse process fracture as well. 2. Atrial fibrillation. Currently patient has normal sinus rhythm on anticoagulation. Will continue with warfarin and rate controlled on diltiazem.  3. Hypertension. Will continue patient on diltiazem, lisinopril, triamterene and hydrochlorothiazide.  4. Pulmonary fibrosis. Patient appears to be in no respiratory distress. Will continue her on montelukast.  5. Anemia. Will continue patient on ferrous gluconate. Her baseline is 11.1 from outside clinic.  6. Deep vein thrombosis prophylaxis. Patient is on warfarin for atrial fibrillation. No need for further chemical anticoagulation.  7. CODE STATUS: Patient is FULL CODE.      TIME SPENT FOR PATIENT CARE: 50 minutes.    ____________________________ Starleen Armsawood S. Denaisha Swango, MD dse:cms D: 01/18/2012 02:26:04 ET T: 01/18/2012 07:09:06 ET JOB#: [id number]  cc: Starleen Armsawood S. Lekeshia Kram, MD, <Dictator> John B. Danne HarborWalker III, MD Magali Bray Teena IraniS Dewel Lotter MD ELECTRONICALLY SIGNED 01/19/2012 4:49

## 2015-05-16 IMAGING — CR PELVIS - 1-2 VIEW
1 series · 2 of 2 positions shown · non-contrast
Comparison: none

REASON FOR EXAM: fall
COMMENTS:

[Series 1: ap · 0.17mm/px · 2 of 2 slices shown]
[im 1/2]
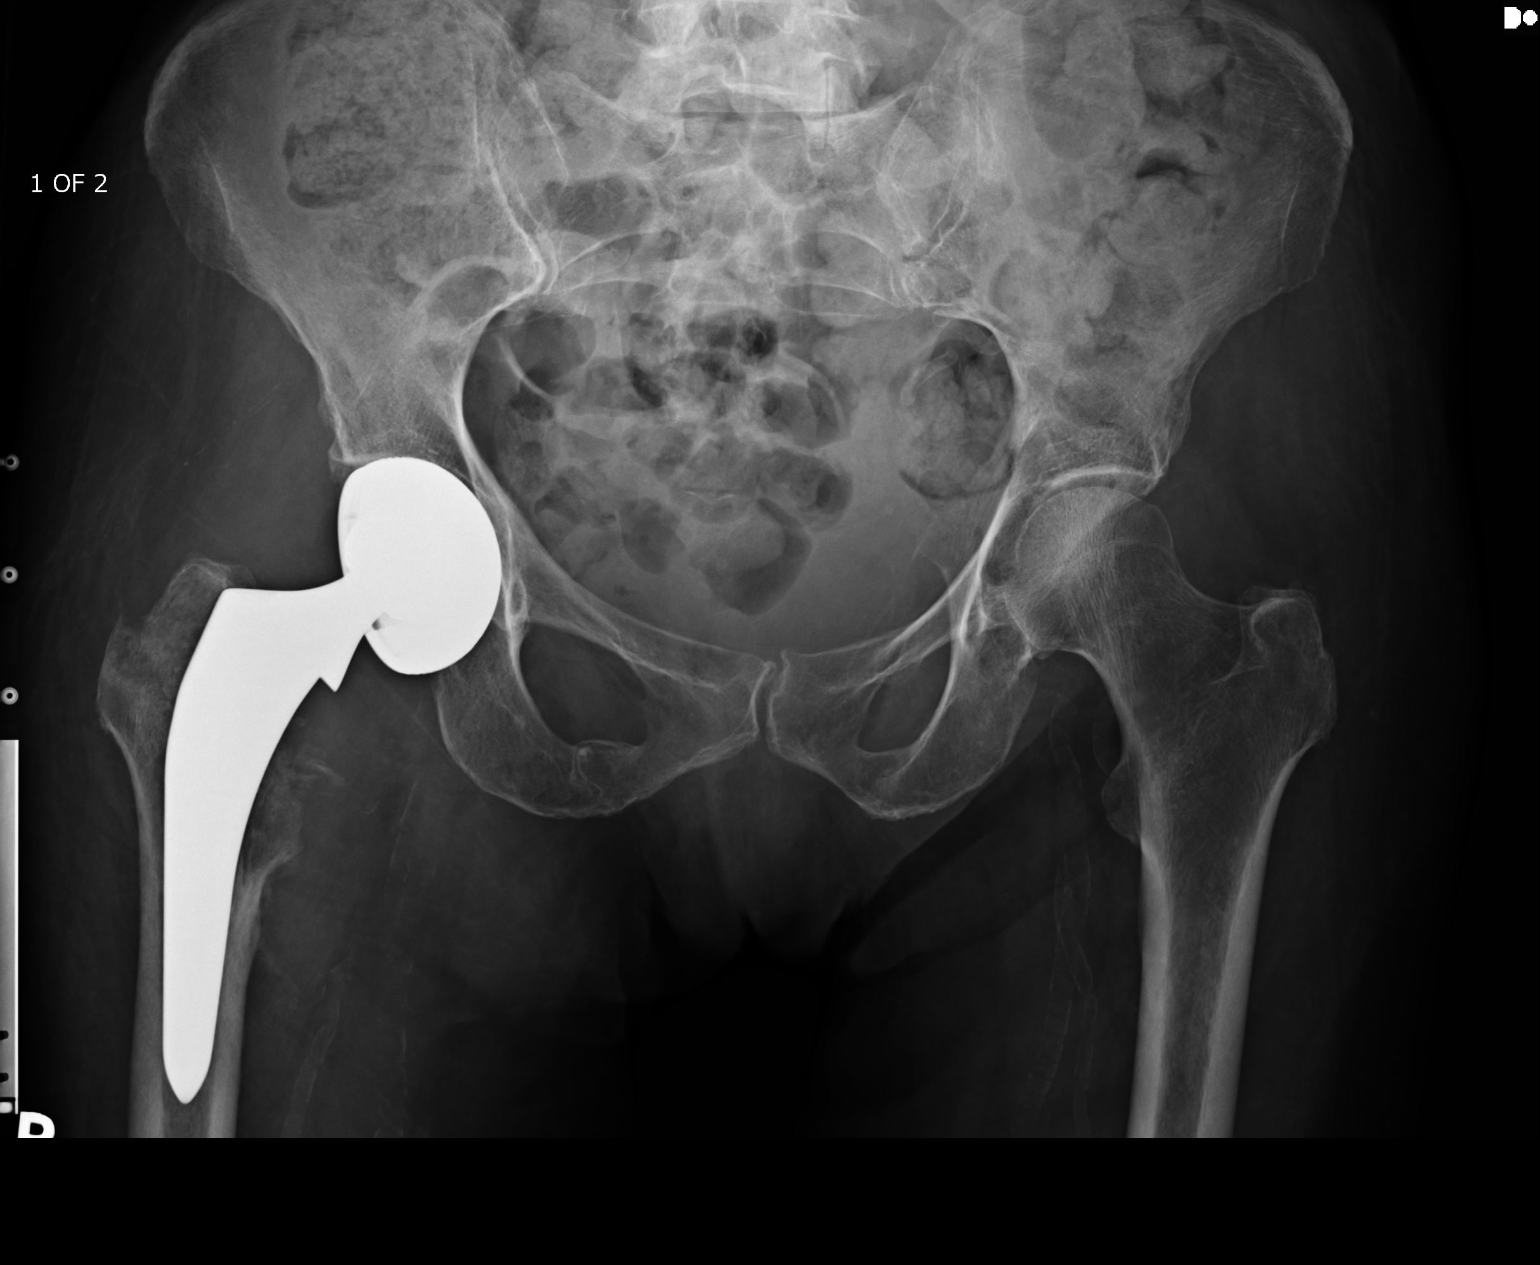
[im 2/2]
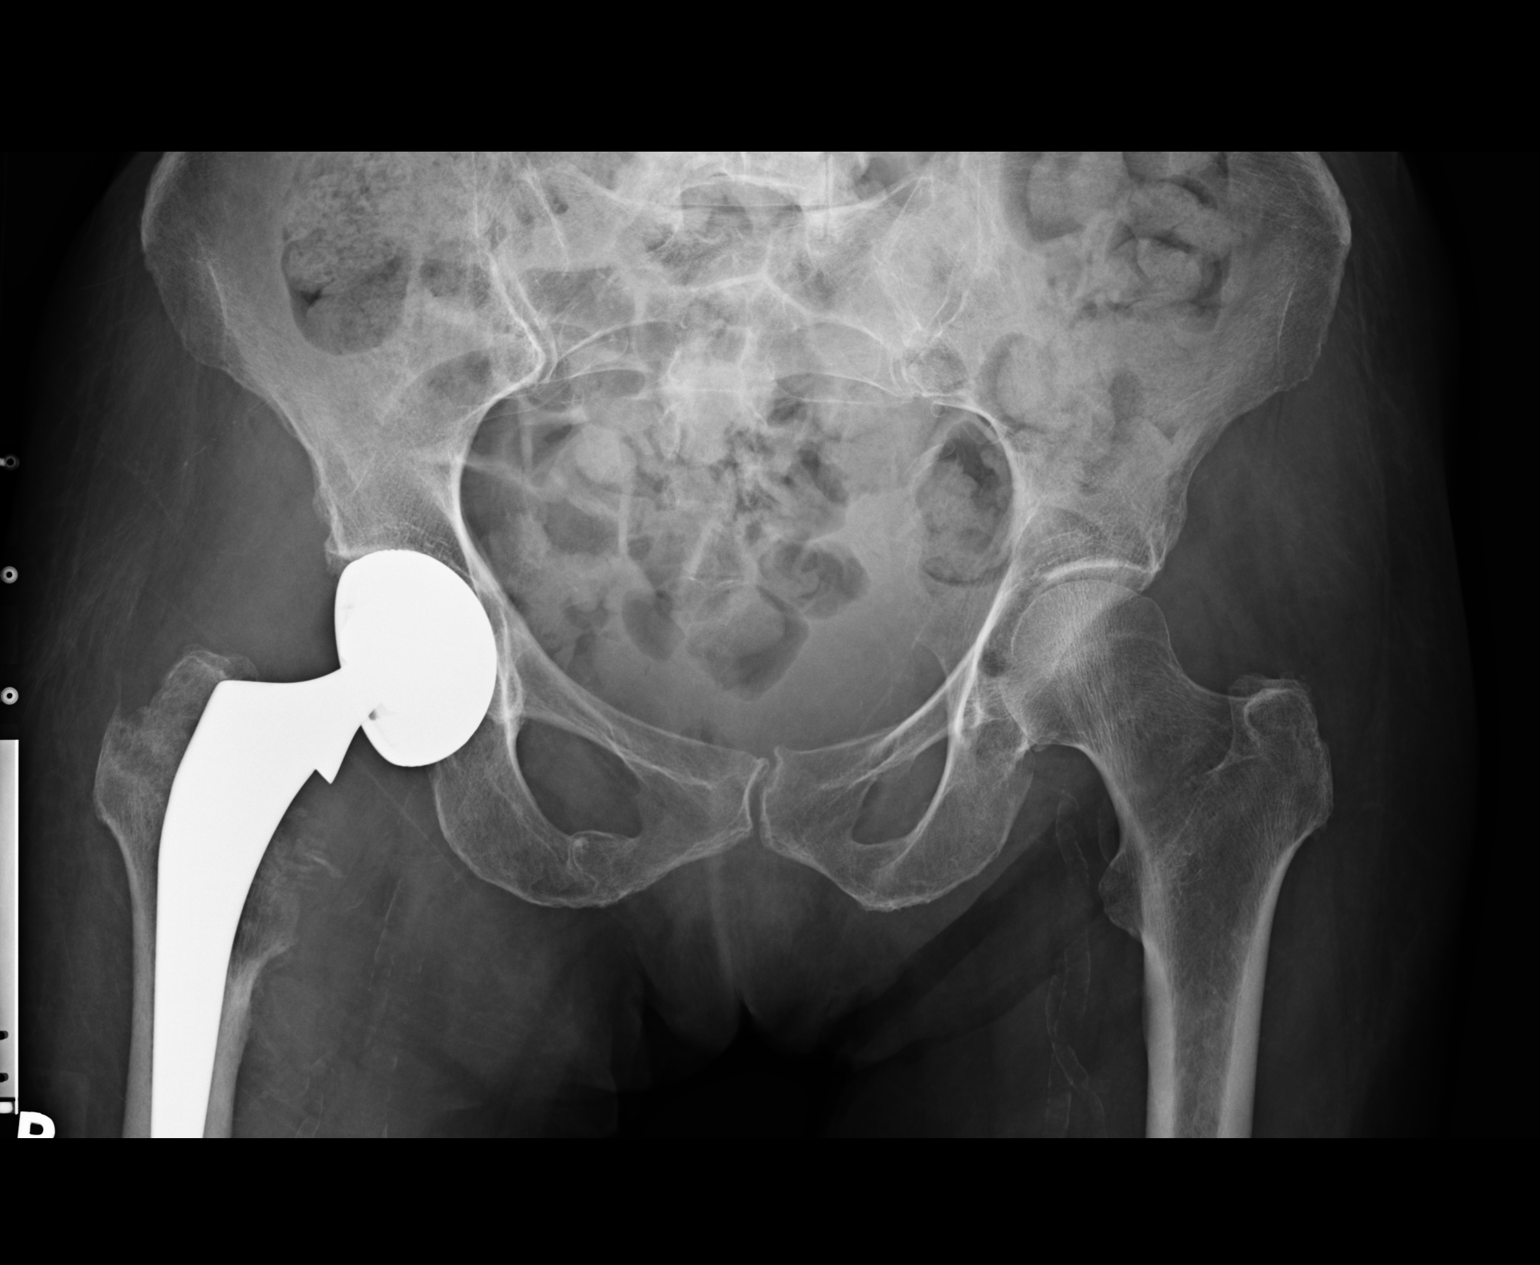

[2 of 2 positions shown; findings below may reference images not displayed]

PROCEDURE:     DXR - DXR PELVIS AP ONLY  - May 15, 2013 [DATE]

RESULT:     The bony pelvis is osteopenic. The native left hip appears
normal. On the right a prosthetic joint is present. There is likely an old
fracture of the inferior pubic ramus on the right. The remainder of the
pelvis exhibits no definite evidence of the fracture. Irregularity of the
cortical margins of the superior pubic ramus on the left is noted that could
reflect a acute or old fracture. I have no previous studies with which to
compare.
IMPRESSION: I do not see evidence of an acute fracture of the
prosthetic right hip or of the left hip. Irregularity of the superior and
inferior pubic rami on the right appears chronic. On the left there is
irregularity of the superior pubic ramus that is of uncertain age. If the
patient has symptoms referable to the left aspect of the pubic bones, CT
scanning would be a useful next step although beam hardening artifact may
limit the study.

[REDACTED]

## 2015-12-05 ENCOUNTER — Encounter: Payer: Self-pay | Admitting: Emergency Medicine

## 2015-12-05 ENCOUNTER — Observation Stay
Admission: EM | Admit: 2015-12-05 | Discharge: 2015-12-07 | Disposition: A | Discharge status: Home / Self Care | Payer: Medicare Other | Attending: Internal Medicine | Admitting: Internal Medicine

## 2015-12-05 DIAGNOSIS — E440 Moderate protein-calorie malnutrition: Secondary | ICD-10-CM | POA: Diagnosis not present

## 2015-12-05 DIAGNOSIS — Z90710 Acquired absence of both cervix and uterus: Secondary | ICD-10-CM | POA: Diagnosis not present

## 2015-12-05 DIAGNOSIS — K219 Gastro-esophageal reflux disease without esophagitis: Secondary | ICD-10-CM | POA: Diagnosis not present

## 2015-12-05 DIAGNOSIS — T447X5A Adverse effect of beta-adrenoreceptor antagonists, initial encounter: Secondary | ICD-10-CM | POA: Insufficient documentation

## 2015-12-05 DIAGNOSIS — M810 Age-related osteoporosis without current pathological fracture: Secondary | ICD-10-CM | POA: Diagnosis not present

## 2015-12-05 DIAGNOSIS — R531 Weakness: Secondary | ICD-10-CM | POA: Diagnosis not present

## 2015-12-05 DIAGNOSIS — Z7982 Long term (current) use of aspirin: Secondary | ICD-10-CM | POA: Diagnosis not present

## 2015-12-05 DIAGNOSIS — I482 Chronic atrial fibrillation: Secondary | ICD-10-CM | POA: Diagnosis not present

## 2015-12-05 DIAGNOSIS — I441 Atrioventricular block, second degree: Secondary | ICD-10-CM | POA: Diagnosis not present

## 2015-12-05 DIAGNOSIS — J309 Allergic rhinitis, unspecified: Secondary | ICD-10-CM | POA: Diagnosis not present

## 2015-12-05 DIAGNOSIS — N183 Chronic kidney disease, stage 3 (moderate): Secondary | ICD-10-CM | POA: Insufficient documentation

## 2015-12-05 DIAGNOSIS — I129 Hypertensive chronic kidney disease with stage 1 through stage 4 chronic kidney disease, or unspecified chronic kidney disease: Secondary | ICD-10-CM | POA: Insufficient documentation

## 2015-12-05 DIAGNOSIS — Z8249 Family history of ischemic heart disease and other diseases of the circulatory system: Secondary | ICD-10-CM | POA: Insufficient documentation

## 2015-12-05 DIAGNOSIS — J8410 Pulmonary fibrosis, unspecified: Secondary | ICD-10-CM | POA: Diagnosis not present

## 2015-12-05 DIAGNOSIS — I480 Paroxysmal atrial fibrillation: Secondary | ICD-10-CM | POA: Insufficient documentation

## 2015-12-05 DIAGNOSIS — R001 Bradycardia, unspecified: Principal | ICD-10-CM | POA: Insufficient documentation

## 2015-12-05 DIAGNOSIS — Z79899 Other long term (current) drug therapy: Secondary | ICD-10-CM | POA: Insufficient documentation

## 2015-12-05 DIAGNOSIS — Z96649 Presence of unspecified artificial hip joint: Secondary | ICD-10-CM | POA: Diagnosis not present

## 2015-12-05 DIAGNOSIS — R42 Dizziness and giddiness: Secondary | ICD-10-CM | POA: Diagnosis not present

## 2015-12-05 DIAGNOSIS — R1013 Epigastric pain: Secondary | ICD-10-CM | POA: Insufficient documentation

## 2015-12-05 HISTORY — DX: Allergic rhinitis, unspecified: J30.9

## 2015-12-05 HISTORY — DX: Gastro-esophageal reflux disease without esophagitis: K21.9

## 2015-12-05 HISTORY — DX: Weakness: R53.1

## 2015-12-05 HISTORY — DX: Constipation, unspecified: K59.00

## 2015-12-05 HISTORY — DX: Essential (primary) hypertension: I10

## 2015-12-05 HISTORY — DX: Unspecified glaucoma: H40.9

## 2015-12-05 HISTORY — DX: Epigastric pain: R10.13

## 2015-12-05 HISTORY — DX: Age-related osteoporosis without current pathological fracture: M81.0

## 2015-12-05 HISTORY — DX: Pulmonary fibrosis, unspecified: J84.10

## 2015-12-05 HISTORY — DX: Unspecified atrial fibrillation: I48.91

## 2015-12-05 LAB — CBC
HCT: 32.5 % — ABNORMAL LOW (ref 35.0–47.0)
HEMOGLOBIN: 11.1 g/dL — AB (ref 12.0–16.0)
MCH: 33.1 pg (ref 26.0–34.0)
MCHC: 34.3 g/dL (ref 32.0–36.0)
MCV: 96.6 fL (ref 80.0–100.0)
PLATELETS: 224 10*3/uL (ref 150–440)
RBC: 3.37 MIL/uL — ABNORMAL LOW (ref 3.80–5.20)
RDW: 13.2 % (ref 11.5–14.5)
WBC: 10.4 10*3/uL (ref 3.6–11.0)

## 2015-12-05 LAB — COMPREHENSIVE METABOLIC PANEL
ALBUMIN: 3.6 g/dL (ref 3.5–5.0)
ALT: 13 U/L — ABNORMAL LOW (ref 14–54)
ANION GAP: 10 (ref 5–15)
AST: 23 U/L (ref 15–41)
Alkaline Phosphatase: 56 U/L (ref 38–126)
BUN: 36 mg/dL — ABNORMAL HIGH (ref 6–20)
CALCIUM: 9.1 mg/dL (ref 8.9–10.3)
CHLORIDE: 96 mmol/L — AB (ref 101–111)
CO2: 29 mmol/L (ref 22–32)
Creatinine, Ser: 1.39 mg/dL — ABNORMAL HIGH (ref 0.44–1.00)
GFR calc non Af Amer: 32 mL/min — ABNORMAL LOW (ref >60)
GFR, EST AFRICAN AMERICAN: 37 mL/min — AB (ref >60)
GLUCOSE: 110 mg/dL — AB (ref 65–99)
POTASSIUM: 4.3 mmol/L (ref 3.5–5.1)
SODIUM: 135 mmol/L (ref 135–145)
Total Bilirubin: 0.8 mg/dL (ref 0.3–1.2)
Total Protein: 7.1 g/dL (ref 6.5–8.1)

## 2015-12-05 LAB — GLUCOSE, CAPILLARY: Glucose-Capillary: 141 mg/dL — ABNORMAL HIGH (ref 65–99)

## 2015-12-05 LAB — TROPONIN I
Troponin I: 0.04 ng/mL — ABNORMAL HIGH (ref <0.031)
Troponin I: 0.06 ng/mL — ABNORMAL HIGH (ref <0.031)
Troponin I: 0.04 ng/mL — ABNORMAL HIGH (ref <0.031)

## 2015-12-05 LAB — MAGNESIUM: Magnesium: 2 mg/dL (ref 1.7–2.4)

## 2015-12-05 MED ORDER — ENOXAPARIN SODIUM 40 MG/0.4ML ~~LOC~~ SOLN
40.0000 mg | SUBCUTANEOUS | Status: DC
  Administered 2015-12-05: 40 mg via SUBCUTANEOUS
  Filled 2015-12-05: qty 0.4

## 2015-12-05 MED ORDER — ALBUTEROL SULFATE (2.5 MG/3ML) 0.083% IN NEBU: 2.5000 mg | INHALATION_SOLUTION | RESPIRATORY_TRACT | Status: DC | PRN

## 2015-12-05 MED ORDER — LATANOPROST 0.005 % OP SOLN
1.0000 [drp] | Freq: Every day | OPHTHALMIC | Status: DC
  Administered 2015-12-05 – 2015-12-06 (×2): 1 [drp] via OPHTHALMIC
  Filled 2015-12-05: qty 2.5

## 2015-12-05 MED ORDER — ONDANSETRON HCL 4 MG/2ML IJ SOLN
4.0000 mg | Freq: Four times a day (QID) | INTRAMUSCULAR | Status: DC | PRN
  Filled 2015-12-05: qty 2

## 2015-12-05 MED ORDER — BISACODYL 5 MG PO TBEC
10.0000 mg | DELAYED_RELEASE_TABLET | Freq: Every day | ORAL | Status: DC
  Administered 2015-12-05 – 2015-12-07 (×3): 10 mg via ORAL
  Filled 2015-12-05 (×3): qty 2

## 2015-12-05 MED ORDER — ACETAMINOPHEN 650 MG RE SUPP: 650.0000 mg | Freq: Four times a day (QID) | RECTAL | Status: DC | PRN

## 2015-12-05 MED ORDER — POLYVINYL ALCOHOL 1.4 % OP SOLN
1.0000 [drp] | Freq: Four times a day (QID) | OPHTHALMIC | Status: DC | PRN
  Filled 2015-12-05: qty 15

## 2015-12-05 MED ORDER — FAMOTIDINE 20 MG PO TABS
20.0000 mg | ORAL_TABLET | Freq: Every day | ORAL | Status: DC
  Administered 2015-12-05 – 2015-12-06 (×2): 20 mg via ORAL
  Filled 2015-12-05 (×2): qty 1

## 2015-12-05 MED ORDER — SODIUM CHLORIDE 0.9% FLUSH
3.0000 mL | Freq: Two times a day (BID) | INTRAVENOUS | Status: DC
  Administered 2015-12-05 – 2015-12-07 (×5): 3 mL via INTRAVENOUS

## 2015-12-05 MED ORDER — BISACODYL 10 MG RE SUPP: 10.0000 mg | Freq: Every day | RECTAL | Status: DC | PRN

## 2015-12-05 MED ORDER — SODIUM CHLORIDE 0.9 % IV SOLN: 250.0000 mL | INTRAVENOUS | Status: DC | PRN

## 2015-12-05 MED ORDER — ONDANSETRON HCL 4 MG PO TABS: 4.0000 mg | ORAL_TABLET | Freq: Four times a day (QID) | ORAL | Status: DC | PRN

## 2015-12-05 MED ORDER — IPRATROPIUM-ALBUTEROL 0.5-2.5 (3) MG/3ML IN SOLN
3.0000 mL | Freq: Four times a day (QID) | RESPIRATORY_TRACT | Status: DC
  Administered 2015-12-05 – 2015-12-06 (×3): 3 mL via RESPIRATORY_TRACT
  Filled 2015-12-05 (×3): qty 3

## 2015-12-05 MED ORDER — ASPIRIN EC 81 MG PO TBEC
81.0000 mg | DELAYED_RELEASE_TABLET | Freq: Every day | ORAL | Status: DC
  Administered 2015-12-05 – 2015-12-07 (×3): 81 mg via ORAL
  Filled 2015-12-05 (×3): qty 1

## 2015-12-05 MED ORDER — SODIUM CHLORIDE 0.9% FLUSH
3.0000 mL | Freq: Two times a day (BID) | INTRAVENOUS | Status: DC
  Administered 2015-12-05 – 2015-12-07 (×4): 3 mL via INTRAVENOUS

## 2015-12-05 MED ORDER — ACETAMINOPHEN 325 MG PO TABS: 650.0000 mg | ORAL_TABLET | Freq: Four times a day (QID) | ORAL | Status: DC | PRN

## 2015-12-05 MED ORDER — ISOSORBIDE MONONITRATE ER 30 MG PO TB24
30.0000 mg | ORAL_TABLET | Freq: Every day | ORAL | Status: DC
  Administered 2015-12-06 – 2015-12-07 (×2): 30 mg via ORAL
  Filled 2015-12-05 (×2): qty 1

## 2015-12-05 MED ORDER — SODIUM CHLORIDE 0.9% FLUSH: 3.0000 mL | INTRAVENOUS | Status: DC | PRN

## 2015-12-05 MED ORDER — FERROUS SULFATE 325 (65 FE) MG PO TABS
325.0000 mg | ORAL_TABLET | Freq: Every day | ORAL | Status: DC
  Administered 2015-12-05 – 2015-12-07 (×3): 325 mg via ORAL
  Filled 2015-12-05 (×3): qty 1

## 2015-12-05 MED ORDER — FUROSEMIDE 40 MG PO TABS
40.0000 mg | ORAL_TABLET | Freq: Every day | ORAL | Status: DC
  Administered 2015-12-06 – 2015-12-07 (×2): 40 mg via ORAL
  Filled 2015-12-05 (×2): qty 1

## 2015-12-05 MED ORDER — IPRATROPIUM-ALBUTEROL 20-100 MCG/ACT IN AERS: 1.0000 | INHALATION_SPRAY | Freq: Four times a day (QID) | RESPIRATORY_TRACT | Status: DC

## 2015-12-05 MED ORDER — DOCUSATE SODIUM 100 MG PO CAPS
100.0000 mg | ORAL_CAPSULE | Freq: Two times a day (BID) | ORAL | Status: DC
  Administered 2015-12-05 – 2015-12-07 (×4): 100 mg via ORAL
  Filled 2015-12-05 (×4): qty 1

## 2015-12-05 NOTE — ED Notes (Signed)
Pt resting in bed at this time. NAD noted. No change in patient's condition at this time. Pt's son at bedside with her. Eyes are noted to be closed and respirations are noted to be even and unlabored at this time.

## 2015-12-05 NOTE — Progress Notes (Addendum)
PT Cancellation Note  Patient Details Name: Kelly SYTSMAMRN: [id number] DOB: [birth year]   Cancelled Treatment:    Reason Eval/Treat Not Completed: Other (comment). Consult received and chart reviewed. Pt with bradycardia initially, and now tachycardia (HR 164). Pt also with decreased O2 sats on room air. Pt with pending cardio consult. Not appropriate for therapy evaluation at this time.    Jamee Keach 12/05/2015, 3:50 PM Elizabeth Palau, PT, DPT [phone]

## 2015-12-05 NOTE — ED Notes (Signed)
Pt requests a cup of ice water. This RN gave patient a cup of ice water at this time. NAD noted. No change in patient condition. Will continue to monitor at this time. Pt's son remains at bedside at this time.

## 2015-12-05 NOTE — ED Notes (Signed)
Pt presents via [facility] with c/o weakness. Per EMS pt's grandson is EMS and called a [employer] truck to [employer] where she lives on independent side. Pt's HR found to be 36 on EMS arrival. Pt does not have pacemaker at this time. EMS states pt in 1st degree block at this time. Pt alert and oriented at this time. No acute distress noted on arrival.

## 2015-12-05 NOTE — ED Notes (Signed)
Report given to Simone, RN.  

## 2015-12-05 NOTE — Consult Note (Signed)
[facility] Cardiology  CARDIOLOGY CONSULT NOTE  Patient ID: Kelly Booth MRN: [id number] DOB/AGE: [birth year] 82 y.o.  Admit date: 12/05/2015 Referring Physician [facility] Primary Physician [facility] Primary Cardiologist  Reason for Consultation bradycardia  HPI: 82 year old female referred for evaluation of bradycardia. The patient has a history of atrial fibrillation, and essential hypertension. She reports a two-week history of generalized weakness. She presents to [facility] emergency room where she was noted to be bradycardic with a heart rate in the 30s. The patient currently is taking metoprolol succinate 50 mg twice a day. Review of her ECG reveals probable Mobitz Type 2 second-degree AV block with 2 to 1 conduction. Metoprolol has been held. Telemetry currently shows Mobitz type I second-degree AV block the heart rate in the 50s. The patient is hemodynamically stable.  Review of systems complete and found to be negative unless listed above     Past Medical History  Diagnosis Date  . A-fib (HCC)   . Glaucoma   . Hypertension   . Allergic rhinitis   . Pulmonary fibrosis (HCC)   . Esophageal reflux   . Dyspepsia   . Constipation   . Generalized weakness   . Osteoporosis     Past Surgical History  Procedure Laterality Date  . Total hip arthroplasty    . Abdominal hysterectomy      Prescriptions prior to admission  Medication Sig Dispense Refill Last Dose  . amLODipine (NORVASC) 5 MG tablet Take 1 tablet by mouth daily.   12/04/2015 at Unknown time  . aspirin EC 81 MG tablet Take 1 tablet by mouth daily.   12/04/2015 at 0830  . bisacodyl (STIMULANT LAXATIVE) 5 MG EC tablet Take 2 tablets by mouth daily.   12/04/2015 at Unknown time  . Calcium Carbonate-Vitamin D (CALCIUM-VITAMIN D) 500-200 MG-UNIT tablet Take 1 tablet by mouth 2 (two) times daily.   12/04/2015 at Unknown time  . Cholecalciferol (VITAMIN D3) 1000 units CAPS Take 1 capsule by mouth daily.   12/04/2015 at Unknown time  . ferrous sulfate  325 (65 FE) MG tablet Take 1 tablet by mouth daily.   12/04/2015 at Unknown time  . furosemide (LASIX) 40 MG tablet Take 1 tablet by mouth daily.   12/04/2015 at Unknown time  . Ipratropium-Albuterol (COMBIVENT) 20-100 MCG/ACT AERS respimat Inhale 1 puff into the lungs 4 (four) times daily.   12/04/2015 at Unknown time  . isosorbide mononitrate (IMDUR) 30 MG 24 hr tablet Take 1 tablet by mouth daily.   12/04/2015 at Unknown time  . latanoprost (XALATAN) 0.005 % ophthalmic solution Apply 1 drop to eye at bedtime.   12/04/2015 at Unknown time  . metoprolol succinate (TOPROL-XL) 50 MG 24 hr tablet Take 1 tablet by mouth 2 (two) times daily.   12/04/2015 at Unknown time  . polyvinyl alcohol (ARTIFICIAL TEARS) 1.4 % ophthalmic solution Apply 1 drop to eye 4 (four) times daily as needed.   PRN  . ranitidine (ZANTAC) 150 MG tablet Take 1 tablet by mouth 2 (two) times daily.   12/04/2015 at Unknown time   Social History   Social History  . Marital Status: Widowed    Spouse Name: N/A  . Number of Children: N/A  . Years of Education: N/A   Occupational History  . Not on file.   Social History Main Topics  . Smoking status: Never Smoker   . Smokeless tobacco: Never Used  . Alcohol Use: No  . Drug Use: No  . Sexual Activity: Not on file  Other Topics Concern  . Not on file   Social History Narrative  . No narrative on file    Family History  Problem Relation Age of Onset  . Hypertension Other       Review of systems complete and found to be negative unless listed above      PHYSICAL EXAM  General: Well developed, well nourished, in no acute distress HEENT:  Normocephalic and atramatic Neck:  No JVD.  Lungs: Clear bilaterally to auscultation and percussion. Heart: HRRR . Normal S1 and S2 without gallops or murmurs.  Abdomen: Bowel sounds are positive, abdomen soft and non-tender  Msk:  Back normal, normal gait. Normal strength and tone for age. Extremities: No clubbing, cyanosis or edema.    Neuro: Alert and oriented X 3. Psych:  Good affect, responds appropriately  Labs:   Lab Results  Component Value Date   WBC 10.4 12/05/2015   HGB 11.1* 12/05/2015   HCT 32.5* 12/05/2015   MCV 96.6 12/05/2015   PLT 224 12/05/2015    Recent Labs Lab 12/05/15 0958  NA 135  K 4.3  CL 96*  CO2 29  BUN 36*  CREATININE 1.39*  CALCIUM 9.1  PROT 7.1  BILITOT 0.8  ALKPHOS 56  ALT 13*  AST 23  GLUCOSE 110*   Lab Results  Component Value Date   CKTOTAL 115 05/14/2013   CKMB 2.4 10/14/2013   TROPONINI 0.04* 12/05/2015    Lab Results  Component Value Date   CHOL 167 10/15/2013   Lab Results  Component Value Date   HDL 36* 10/15/2013   Lab Results  Component Value Date   LDLCALC 103* 10/15/2013   Lab Results  Component Value Date   TRIG 140 10/15/2013   No results found for: CHOLHDL No results found for: LDLDIRECT    Radiology: No results found.  EKG: Mobitz type II second degree AV block  ASSESSMENT AND PLAN:   1. Mobitz type II second degree AV block, heart rate improved after holding metoprolol succinate, patient hemodynamically stable  Recommendations  1. Continue to hold metoprolol succinate 2. Defer temporary pacemaker at this time 3. Consider permanent pacemaker if Mobitz 2 second-degree AV block does not resolve after holding metoprolol  Signed: Aaron Bostwick MD,PhD, [facility] 12/05/2015, 5:16 PM

## 2015-12-05 NOTE — H&P (Signed)
[facility] [place] at [facility]   PATIENT NAME: Kelly Booth    MR#:  [id number]  DATE OF BIRTH:  08-27-1920  DATE OF ADMISSION:  12/05/2015  PRIMARY CARE PHYSICIAN: No primary care provider on file.   REQUESTING/REFERRING PHYSICIAN: DR. PADUCHOWSKI  CHIEF COMPLAINT:   Chief Complaint  Patient presents with  . Weakness  . Bradycardia    HISTORY OF PRESENT ILLNESS:  Kelly Booth  is a 82 y.o. female with a known history of hypertension, scoliosis, pulmonary fibrosis, atrial fibrillation presents to the emergency room with weakness of 2 weeks. Patient on arrival to the emergency room has found to have bradycardia into the low 30s. Blood pressure is stable. She does have on and off dizziness. At baseline walks with a walker and this has been slowly getting difficult. No recent change in medications. She does take Toprol 50 mg 2 times a day. Grandson at bedside mentions that patient had a cold and dehydration 2 months back after which her vitals were closely followed and her heart rate generally was around 60.  PAST MEDICAL HISTORY:   Past Medical History  Diagnosis Date  . A-fib (HCC)   . Glaucoma   . Hypertension   . Allergic rhinitis   . Pulmonary fibrosis (HCC)   . Esophageal reflux   . Dyspepsia   . Constipation   . Generalized weakness   . Osteoporosis     PAST SURGICAL HISTORY:   Past Surgical History  Procedure Laterality Date  . Total hip arthroplasty    . Abdominal hysterectomy      SOCIAL HISTORY:   Social History  Substance Use Topics  . Smoking status: Never Smoker   . Smokeless tobacco: Never Used  . Alcohol Use: No    FAMILY HISTORY:   Family History  Problem Relation Age of Onset  . Hypertension Other     DRUG ALLERGIES:  No Known Allergies  REVIEW OF SYSTEMS:   Review of Systems  Constitutional: Positive for malaise/fatigue. Negative for fever, chills and weight loss.  HENT: Negative for hearing loss and  nosebleeds.   Eyes: Negative for blurred vision, double vision and pain.  Respiratory: Negative for cough, hemoptysis, sputum production, shortness of breath and wheezing.   Cardiovascular: Negative for chest pain, palpitations, orthopnea and leg swelling.  Gastrointestinal: Negative for nausea, vomiting, abdominal pain, diarrhea and constipation.  Genitourinary: Negative for dysuria and hematuria.  Musculoskeletal: Positive for back pain. Negative for myalgias and falls.  Skin: Negative for rash.  Neurological: Positive for dizziness and weakness. Negative for tremors, sensory change, speech change, focal weakness, seizures and headaches.  Endo/Heme/Allergies: Does not bruise/bleed easily.  Psychiatric/Behavioral: Negative for depression and memory loss. The patient is not nervous/anxious.     MEDICATIONS AT HOME:   Prior to Admission medications   Medication Sig Start Date End Date Taking? Authorizing Provider  amLODipine (NORVASC) 5 MG tablet Take 1 tablet by mouth daily. 09/18/15  Yes Historical Provider, MD  aspirin EC 81 MG tablet Take 1 tablet by mouth daily. 09/18/15  Yes Historical Provider, MD  bisacodyl (STIMULANT LAXATIVE) 5 MG EC tablet Take 2 tablets by mouth daily. 09/18/15  Yes Historical Provider, MD  Calcium Carbonate-Vitamin D (CALCIUM-VITAMIN D) 500-200 MG-UNIT tablet Take 1 tablet by mouth 2 (two) times daily. 09/18/15  Yes Historical Provider, MD  Cholecalciferol (VITAMIN D3) 1000 units CAPS Take 1 capsule by mouth daily. 09/18/15  Yes Historical Provider, MD  ferrous sulfate 325 (65 FE)  MG tablet Take 1 tablet by mouth daily. 09/18/15  Yes Historical Provider, MD  furosemide (LASIX) 40 MG tablet Take 1 tablet by mouth daily. 09/18/15  Yes Historical Provider, MD  Ipratropium-Albuterol (COMBIVENT) 20-100 MCG/ACT AERS respimat Inhale 1 puff into the lungs 4 (four) times daily. 09/18/15  Yes Historical Provider, MD  isosorbide mononitrate (IMDUR) 30 MG 24 hr tablet Take 1  tablet by mouth daily. 09/18/15  Yes Historical Provider, MD  latanoprost (XALATAN) 0.005 % ophthalmic solution Apply 1 drop to eye at bedtime. 09/18/15  Yes Historical Provider, MD  metoprolol succinate (TOPROL-XL) 50 MG 24 hr tablet Take 1 tablet by mouth 2 (two) times daily. 09/18/15  Yes Historical Provider, MD  polyvinyl alcohol (ARTIFICIAL TEARS) 1.4 % ophthalmic solution Apply 1 drop to eye 4 (four) times daily as needed. 09/18/15  Yes Historical Provider, MD  ranitidine (ZANTAC) 150 MG tablet Take 1 tablet by mouth 2 (two) times daily. 09/18/15  Yes Historical Provider, MD     VITAL SIGNS:  Blood pressure 120/34, pulse 35, temperature 97.8 F (36.6 C), temperature source Oral, resp. rate 19, height  (1.575 m), weight 52.844 kg (116 lb 8 oz), SpO2 94 %.  PHYSICAL EXAMINATION:  Physical Exam  GENERAL:  82 y.o.-year-old patient lying in the bed with no acute distress.  EYES: Pupils equal, round, reactive to light and accommodation. No scleral icterus. Extraocular muscles intact.  HEENT: Head atraumatic, normocephalic. Oropharynx and nasopharynx clear. No oropharyngeal erythema, moist oral mucosa  NECK:  Supple, no jugular venous distention. No thyroid enlargement, no tenderness. LUNGS: Bilateral diffuse crackles. CARDIOVASCULAR: S1, S2 , bradycardia. ABDOMEN: Soft, nontender, nondistended. Bowel sounds present. No organomegaly or mass.  EXTREMITIES: No pedal edema, cyanosis, or clubbing. + 2 pedal & radial pulses b/l.   NEUROLOGIC: Cranial nerves II through XII are intact. No focal Motor or sensory deficits appreciated b/l. PSYCHIATRIC: The patient is alert and oriented x 3. Good affect.  SKIN: No obvious rash, lesion, or ulcer.   Severe kyphosis and scoliosis.  LABORATORY PANEL:   CBC  Recent Labs Lab 12/05/15 0958  WBC 10.4  HGB 11.1*  HCT 32.5*  PLT 224    ------------------------------------------------------------------------------------------------------------------  Chemistries   Recent Labs Lab 12/05/15 0958  NA 135  K 4.3  CL 96*  CO2 29  GLUCOSE 110*  BUN 36*  CREATININE 1.39*  CALCIUM 9.1  MG 2.0  AST 23  ALT 13*  ALKPHOS 56  BILITOT 0.8   ------------------------------------------------------------------------------------------------------------------  Cardiac Enzymes  Recent Labs Lab 12/05/15 0958  TROPONINI 0.04*   ------------------------------------------------------------------------------------------------------------------  RADIOLOGY:  No results found.   IMPRESSION AND PLAN:   * Sinus bradycardia likely due to Toprol Her weakness is most likely due to the bradycardia. We'll admit patient under observation to telemetry. Hold Toprol. Repeat troponin. Consult cardiology. Patient's blood pressure is stable at this point. If heart rate doesn't improve will likely need pacemaker.  * Hypertension Continue home medications except Toprol  * Primary fibrosis Continue the Combivent 4 times a day  * CKD stage III is stable  * DVT prophylaxis with Lovenox  All the records are reviewed and case discussed with ED provider. Management plans discussed with the patient, family and they are in agreement.  CODE STATUS: DNR  TOTAL TIME TAKING CARE OF THIS PATIENT: 40 minutes.   Milagros Loll R M.D on 12/05/2015 at 1:09 PM  Between 7am to 6pm - Pager - [phone]  After 6pm go to [email or web address] - password [email or web address]  [facility]  [place] [facility]  Office  [phone]  CC: Primary care physician; No primary care provider on file.  Note: This dictation was prepared with Dragon dictation along with smaller phrase technology. Any transcriptional errors that result from this process are unintentional.

## 2015-12-05 NOTE — ED Provider Notes (Signed)
[facility] Emergency Department Provider Note  Time seen: 9:59 AM  I have reviewed the triage vital signs and the nursing notes.   HISTORY  Chief Complaint Weakness and Bradycardia    HPI Kelly Booth is a 82 y.o. female who presents to the emergency department for generalized weakness. According to the patient's son the [facility] [place] independently living, has been complaining of weakness since yesterday. Once again complained of feeling very weak this morning to her son. Son used a portable pulse oximetry to check her vitals showing normal oxygen level but a pulse of 30-35 bpm. Patient's son then called EMS who confirmed approximately 31 bpm, patient was brought to the hospital. Here the patient denies any acute symptoms besides generalized weakness. Denies any chest pain now or at any time. Denies any shortness of breath nausea or diaphoresis. Denies any abdominal pain.     Past Medical History  Diagnosis Date  . A-fib (HCC)   . Glaucoma   . Hypertension   . Allergic rhinitis   . Pulmonary fibrosis (HCC)   . Esophageal reflux   . Dyspepsia   . Constipation   . Generalized weakness   . Osteoporosis     There are no active problems to display for this patient.   Past Surgical History  Procedure Laterality Date  . Total hip arthroplasty    . Abdominal hysterectomy      No current outpatient prescriptions on file.  Allergies Review of patient's allergies indicates no known allergies.  History reviewed. No pertinent family history.  Social History Social History  Substance Use Topics  . Smoking status: Never Smoker   . Smokeless tobacco: Never Used  . Alcohol Use: No    Review of Systems Constitutional: Negative for fever Cardiovascular: Negative for chest pain. Respiratory: Negative for shortness of breath. Gastrointestinal: Negative for abdominal pain, vomiting and diarrhea. Genitourinary: Negative for  dysuria. Musculoskeletal: Negative for back pain. Skin: Negative for rash. Neurological: Negative for headaches, focal weakness or numbness. 10-point ROS otherwise negative.  ____________________________________________   PHYSICAL EXAM:  VITAL SIGNS: ED Triage Vitals  Enc Vitals Group     BP 12/05/15 0957 144/47 mmHg     Pulse Rate 12/05/15 0957 38     Resp 12/05/15 0957 23     Temp 12/05/15 0957 97.8 F (36.6 C)     Temp Source 12/05/15 0957 Oral     SpO2 12/05/15 0949 98 %     Weight 12/05/15 0957 116 lb 8 oz (52.844 kg)     Height 12/05/15 0957  (1.575 m)     Head Cir --      Peak Flow --      Pain Score --      Pain Loc --      Pain Edu? --      Excl. in GC? --     Constitutional: Alert and oriented. Well appearing and in no distress. Eyes: Normal exam ENT   Head: Normocephalic and atraumatic.   Mouth/Throat: Mucous membranes are moist. Cardiovascular: Normal rate, regular rhythm. No murmur Respiratory: Normal respiratory effort without tachypnea nor retractions. Breath sounds are clear Gastrointestinal: Soft and nontender. No distention. Musculoskeletal: Nontender with normal range of motion in all extremities Neurologic:  Normal speech and language. No gross focal neurologic deficits Skin:  Skin is warm, dry and intact.  Psychiatric: Mood and affect are normal. Speech and behavior are normal.  ____________________________________________    EKG  EKG reviewed and interpreted by myself shows sinus bradycardia at 36 bpm, narrow QRS, normal axis, normal intervals, nonspecific ST changes.  ____________________________________________      INITIAL IMPRESSION / ASSESSMENT AND PLAN / ED COURSE  Pertinent labs & imaging results that were available during my care of the patient were reviewed by me and considered in my medical decision making (see chart for details).  Labs are largely within normal limits besides a troponin of 0.04. Patient's EKG  appears to show sinus bradycardia. No signs of heart block. It is unclear the cause of the patient's acute sinus bradycardia however I feel this is likely the cause for generalized weakness. Given her persistent bradycardia at 30-30 5 bpm we will admit to the hospital for further evaluation.  ____________________________________________   FINAL CLINICAL IMPRESSION(S) / ED DIAGNOSES  Sinus bradycardia Symptomatic bradycardia   Minna Antis, MD 12/05/15 1320

## 2015-12-05 NOTE — Progress Notes (Signed)
Patient admitted to unit from ER,  Alert and oriented denies any pain or discomfort.  Patient on the monitor, bradycardia 51, mood calm. Admission  assessment completed, patient oriented to room  , to call bell, fall precaution implemented.

## 2015-12-06 DIAGNOSIS — R001 Bradycardia, unspecified: Secondary | ICD-10-CM | POA: Diagnosis not present

## 2015-12-06 DIAGNOSIS — E440 Moderate protein-calorie malnutrition: Secondary | ICD-10-CM | POA: Insufficient documentation

## 2015-12-06 LAB — BASIC METABOLIC PANEL
ANION GAP: 8 (ref 5–15)
BUN: 27 mg/dL — ABNORMAL HIGH (ref 6–20)
CHLORIDE: 102 mmol/L (ref 101–111)
CO2: 29 mmol/L (ref 22–32)
Calcium: 8.6 mg/dL — ABNORMAL LOW (ref 8.9–10.3)
Creatinine, Ser: 1.04 mg/dL — ABNORMAL HIGH (ref 0.44–1.00)
GFR calc Af Amer: 52 mL/min — ABNORMAL LOW (ref >60)
GFR calc non Af Amer: 45 mL/min — ABNORMAL LOW (ref >60)
Glucose, Bld: 99 mg/dL (ref 65–99)
POTASSIUM: 3.8 mmol/L (ref 3.5–5.1)
Sodium: 139 mmol/L (ref 135–145)

## 2015-12-06 MED ORDER — IPRATROPIUM-ALBUTEROL 0.5-2.5 (3) MG/3ML IN SOLN
3.0000 mL | Freq: Four times a day (QID) | RESPIRATORY_TRACT | Status: DC
  Administered 2015-12-06 – 2015-12-07 (×4): 3 mL via RESPIRATORY_TRACT
  Filled 2015-12-06 (×4): qty 3

## 2015-12-06 MED ORDER — ENOXAPARIN SODIUM 30 MG/0.3ML ~~LOC~~ SOLN
30.0000 mg | SUBCUTANEOUS | Status: DC
  Administered 2015-12-06: 30 mg via SUBCUTANEOUS
  Filled 2015-12-06: qty 0.3

## 2015-12-06 MED ORDER — ENSURE ENLIVE PO LIQD
237.0000 mL | Freq: Every morning | ORAL | Status: DC
  Administered 2015-12-06: 237 mL via ORAL

## 2015-12-06 NOTE — Care Management (Signed)
Spoke with patient's son [name]. Discussed PT recommedations. He is agreeable to POC and OP PT. Spoke with Angie at [facility]. She is the facility's PT. She states patient will need out patient OT/PT order. Requested out patient PT/OT order from Dr. Luberta MutterKonidena.

## 2015-12-06 NOTE — Care Management Note (Addendum)
Case Management Note  Patient Details  Name: Kelly Booth MRN: [id number] Date of Birth: Jun 15, 1920  Subjective/Objective:  Met with patient who states she is from [place]. CSW notified and verified that it is independent living.                  Action/Plan: Will monitor progression  Expected Discharge Date:                  Expected Discharge Plan:  [place]  In-House Referral:  Clinical Social Work  Discharge planning Services     Post Acute Care Choice:    Choice offered to:     DME Arranged:    DME Agency:     HH Arranged:    [place] Agency:     Status of Service:  In process, will continue to follow  Medicare Important Message Given:    Date Medicare IM Given:    Medicare IM give by:    Date Additional Medicare IM Given:    Additional Medicare Important Message give by:     If discussed at [place] of Stay Meetings, dates discussed:    Additional Comments:  Jolly Mango, RN 12/06/2015, 8:57 AM

## 2015-12-06 NOTE — Progress Notes (Signed)
Initial Nutrition Assessment  Non-severe (moderate) malnutrition in context of chronic illness  INTERVENTION:   Meals and Snacks: Cater to patient preferences Medical Food Supplement Therapy: recommend addition of Ensure Enlive po daily, each supplement provides 350 kcal and 20 grams of protein   NUTRITION DIAGNOSIS:   Malnutrition related to chronic illness as evidenced by mild depletion of body fat, mild depletion of muscle mass.  GOAL:   Patient will meet greater than or equal to 90% of their needs  MONITOR:    (Energy Intake, Anthropometrics, Electrolyte/Renal Profile, Digestive System)  REASON FOR ASSESSMENT:   Malnutrition Screening Tool    ASSESSMENT:    Pt admitted with weakness, dehydration, bradycardia  Past Medical History  Diagnosis Date  . A-fib (HCC)   . Glaucoma   . Hypertension   . Allergic rhinitis   . Pulmonary fibrosis (HCC)   . Esophageal reflux   . Dyspepsia   . Constipation   . Generalized weakness   . Osteoporosis      Diet Order:  Diet Heart Room service appropriate?: Yes; Fluid consistency:: Thin   Energy Intake: recorded po intake 40% on average; pt ate 50% of breakfast and lunch today; pt reports appetite is good  Food and nutrition related history: pt resides in independent living retirement community, eats meals in dining room with other residents, reports she eats at least 50% of all meals, occasionally uses nutritional supplements like Ensure/Boost   Recent Labs Lab 12/05/15 0958 12/06/15 0451  NA 135 139  K 4.3 3.8  CL 96* 102  CO2 29 29  BUN 36* 27*  CREATININE 1.39* 1.04*  CALCIUM 9.1 8.6*  MG 2.0  --   GLUCOSE 110* 99    Glucose Profile:   Recent Labs  12/05/15 2047  GLUCAP 141*   Meds: lasix  Nutrition Focused Physical Exam: Nutrition-Focused physical exam completed. Findings are mild fat depletion, mild muscle depletion, and no edema.   Height:   Ht Readings from Last 1 Encounters:  12/05/15 5\' 2"   (1.575 m)    Weight: pt reports wt has been stable  Wt Readings from Last 1 Encounters:  12/06/15 103 lb 1.6 oz (46.766 kg)     BMI:  Body mass index is 18.85 kg/(m^2).  Estimated Nutritional Needs:   Kcal:  4098-11911074-1289 kcals (BEE 826, 1.3 AF, 1.0-1.2 IF)   Protein:  47-56 g (1.0-1.2 g/kg)   Fluid:  1175-1410 mL (25-30 ml/kg)   MODERATE Care Level  Romelle Starcherate Leonid Manus MS, RD, LDN [phone] Pager  [phone] Weekend/On-Call Pager

## 2015-12-06 NOTE — Progress Notes (Signed)
[facility] - [place] at [facility]   PATIENT NAME: Kelly Booth    MR#:  [id number]  DATE OF BIRTH:  1920/04/28  SUBJECTIVE: admitted for weakness,bradycardia.pt denies any dizziness,or weakness.HR in 60s now.improved from 37 to 60.  CHIEF COMPLAINT:   Chief Complaint  Patient presents with  . Weakness  . Bradycardia    REVIEW OF SYSTEMS:    Review of Systems  Constitutional: Negative for fever and chills.  HENT: Negative for hearing loss.   Eyes: Negative for blurred vision, double vision and photophobia.  Respiratory: Negative for cough, hemoptysis and shortness of breath.   Cardiovascular: Negative for palpitations, orthopnea and leg swelling.  Gastrointestinal: Negative for vomiting, abdominal pain and diarrhea.  Genitourinary: Negative for dysuria and urgency.  Musculoskeletal: Negative for myalgias and neck pain.  Skin: Negative for rash.  Neurological: Negative for dizziness, focal weakness, seizures, weakness and headaches.  Psychiatric/Behavioral: Negative for memory loss. The patient does not have insomnia.     Nutrition: Tolerating Diet: Tolerating PT:      DRUG ALLERGIES:  No Known Allergies  VITALS:  Blood pressure 137/35, pulse 98, temperature 97.7 F (36.5 C), temperature source Axillary, resp. rate 18, height  (1.575 m), weight 46.766 kg (103 lb 1.6 oz), SpO2 94 %.  PHYSICAL EXAMINATION:   Physical Exam  GENERAL:  82 y.o.-year-old patient lying in the bed with no acute distress.  EYES: Pupils equal, round, reactive to light and accommodation. No scleral icterus. Extraocular muscles intact.  HEENT: Head atraumatic, normocephalic. Oropharynx and nasopharynx clear.  NECK:  Supple, no jugular venous distention. No thyroid enlargement, no tenderness.  LUNGS: Normal breath sounds bilaterally, no wheezing, rales,rhonchi or crepitation. No use of accessory muscles of respiration.  CARDIOVASCULAR: S1, S2 normal. No murmurs,  rubs, or gallops.  ABDOMEN: Soft, nontender, nondistended. Bowel sounds present. No organomegaly or mass.  EXTREMITIES: No pedal edema, cyanosis, or clubbing.  NEUROLOGIC: Cranial nerves II through XII are intact. Muscle strength 5/5 in all extremities. Sensation intact. Gait not checked.  PSYCHIATRIC: The patient is alert and oriented x 3.  SKIN: No obvious rash, lesion, or ulcer.    LABORATORY PANEL:   CBC  Recent Labs Lab 12/05/15 0958  WBC 10.4  HGB 11.1*  HCT 32.5*  PLT 224   ------------------------------------------------------------------------------------------------------------------  Chemistries   Recent Labs Lab 12/05/15 0958 12/06/15 0451  NA 135 139  K 4.3 3.8  CL 96* 102  CO2 29 29  GLUCOSE 110* 99  BUN 36* 27*  CREATININE 1.39* 1.04*  CALCIUM 9.1 8.6*  MG 2.0  --   AST 23  --   ALT 13*  --   ALKPHOS 56  --   BILITOT 0.8  --    ------------------------------------------------------------------------------------------------------------------  Cardiac Enzymes  Recent Labs Lab 12/05/15 2306  TROPONINI 0.04*   ------------------------------------------------------------------------------------------------------------------  RADIOLOGY:  No results found.   ASSESSMENT AND PLAN:   Active Problems:   Bradycardia   Malnutrition of moderate degree 1.transient  Mobitz type 2 heart block;improving HR ,off the blocker monitor for  Another 24 hrs ,if  Stable discharge home am,if still bradycardia with recurrent type 2 second degree.,then she may  Need PPM<  3.deconditionig;PT recommends Out pt PT   All the records are reviewed and case discussed with Care Management/Social Workerr. Management plans discussed with the patient, family and they are in agreement.  CODE STATUS:full  TOTAL TIME TAKING CARE OF THIS PATIENT: 25 minutes.   POSSIBLE D/C IN  1-2DAYS,  DEPENDING ON CLINICAL CONDITION.   Katha HammingKONIDENA,Dewell Monnier M.D on 12/06/2015 at 4:02  PM  Between 7am to 6pm - Pager - [phone]  After 6pm go to [email or web address] - password [email or web address] [facility]  [facility]  Office  [phone]  CC: Primary care physician; No primary care provider on file.

## 2015-12-06 NOTE — Care Management Obs Status (Signed)
MEDICARE OBSERVATION STATUS NOTIFICATION   Patient Details  Name: Kelly Booth MRN: [id number] Date of Birth: 11/25/1919   Medicare Observation Status Notification Given:  Yes    Marily MemosLisa M Jamey Harman, RN 12/06/2015, 8:56 AM

## 2015-12-06 NOTE — Progress Notes (Signed)
[facility]  SUBJECTIVE: I feel better   Filed Vitals:   12/05/15 2015 12/06/15 0434 12/06/15 0622 12/06/15 0745  BP:  131/46  145/40  Pulse:  37 53 49  Temp:  98 F (36.7 C)    TempSrc:  Oral    Resp:  18  18  Height:      Weight:  46.766 kg (103 lb 1.6 oz)    SpO2: 92% 97% 91% 93%     Intake/Output Summary (Last 24 hours) at 12/06/15 0815 Last data filed at 12/05/15 1738  Gross per 24 hour  Intake    120 ml  Output      0 ml  Net    120 ml      PHYSICAL EXAM  General: Well developed, well nourished, in no acute distress HEENT:  Normocephalic and atramatic Neck:  No JVD.  Lungs: Clear bilaterally to auscultation and percussion. Heart: HRRR . Normal S1 and S2 without gallops or murmurs.  Abdomen: Bowel sounds are positive, abdomen soft and non-tender  Msk:  Back normal, normal gait. Normal strength and tone for age. Extremities: No clubbing, cyanosis or edema.   Neuro: Alert and oriented X 3. Psych:  Good affect, responds appropriately   LABS: Basic Metabolic Panel:  Recent Labs  16/07/9602/02/17 0958 12/06/15 0451  NA 135 139  K 4.3 3.8  CL 96* 102  CO2 29 29  GLUCOSE 110* 99  BUN 36* 27*  CREATININE 1.39* 1.04*  CALCIUM 9.1 8.6*  MG 2.0  --    Liver Function Tests:  Recent Labs  12/05/15 0958  AST 23  ALT 13*  ALKPHOS 56  BILITOT 0.8  PROT 7.1  ALBUMIN 3.6   No results for input(s): LIPASE, AMYLASE in the last 72 hours. CBC:  Recent Labs  12/05/15 0958  WBC 10.4  HGB 11.1*  HCT 32.5*  MCV 96.6  PLT 224   Cardiac Enzymes:  Recent Labs  12/05/15 0958 12/05/15 1710 12/05/15 2306  TROPONINI 0.04* 0.06* 0.04*   BNP: Invalid input(s): POCBNP D-Dimer: No results for input(s): DDIMER in the last 72 hours. Hemoglobin A1C: No results for input(s): HGBA1C in the last 72 hours. Fasting Lipid Panel: No results for input(s): CHOL, HDL, LDLCALC, TRIG, CHOLHDL, LDLDIRECT in the last 72 hours. Thyroid Function Tests: No results for  input(s): TSH, T4TOTAL, T3FREE, THYROIDAB in the last 72 hours.  Invalid input(s): FREET3 Anemia Panel: No results for input(s): VITAMINB12, FOLATE, FERRITIN, TIBC, IRON, RETICCTPCT in the last 72 hours.  No results found.   Echo  TELEMETRY: Mobitz type I second-degree AV block  ASSESSMENT AND PLAN:  Active Problems:   Bradycardia    1. Transient Mobitz type II second degree AV block, currently in Mobitz type I second-degree AV block with heart rate in the 50s off of metoprolol  Recommendations  1. Hold metoprolol 2. Defer temporary pacemaker 3. Continue to closely monitor, if patient remains in Mobitz type I second-degree heart block with stable heart rate then may discharge home in a.m., follow-up as outpatient. If patient has recurrent Mobitz type II second degree AV block despite holding metoprolol, then proceed with permanent pacemaker. Discussed plan with son who is POA, and agrees.   Marcina MillardPARASCHOS,Zyia Kaneko, MD, PhD, [facility] 12/06/2015 8:15 AM

## 2015-12-06 NOTE — Evaluation (Signed)
Physical Therapy Evaluation Patient Details Name: Kelly Booth MRN: [id number] DOB: 12/25/1919 Today's Date: 12/06/2015   History of Present Illness  Pt admitted for for bradycardia. Pt with history of Afib, glaucoma, and pulm. fibrosis. Pt reports she is from [facility].   Clinical Impression  Pt is a pleasant 82 year old female who was admitted for bradycardia. Pt performs bed mobility with mod I, transfers with supervision, and ambulation with cga and rw. Pt demonstrates deficits with decreased balance/strength/endurance/mobility. HR 53bpm at rest with increasing to 76bpm with exertion. Would benefit from skilled PT to address above deficits and promote optimal return to PLOF. Pt reports being close to baseline and is safe with all mobility. Pt needs to use AD for all mobility for safety. Recommend transition to HHPT upon discharge from acute hospitalization.       Follow Up Recommendations Home health PT    Equipment Recommendations       Recommendations for Other Services       Precautions / Restrictions Precautions Precautions: Fall Restrictions Weight Bearing Restrictions: No      Mobility  Bed Mobility Overal bed mobility: Modified Independent             General bed mobility comments: assist for use of handrail and to scoot towards EOB. Once seated at EOB, pt able to sit with independence  Transfers Overall transfer level: Needs assistance Equipment used: Rolling walker (2 wheeled) Transfers: Sit to/from Stand Sit to Stand: Supervision         General transfer comment: transfers performed with rw and supervision. Safe technique performed.  Ambulation/Gait Ambulation/Gait assistance: Min guard Ambulation Distance (Feet): 20 Feet Assistive device: Rolling walker (2 wheeled) Gait Pattern/deviations: Step-through pattern     General Gait Details: ambulated in room with safe technique. Pt fatigues slightly with endurance with O2 sats  decreasing to 88% on room air. Once seated, sats increase to 90%.  Slow speed noted  Stairs            Wheelchair Mobility    Modified Rankin (Stroke Patients Only)       Balance                                             Pertinent Vitals/Pain Pain Assessment: No/denies pain    Home Living Family/patient expects to be discharged to:: Assisted living               Home Equipment: Walker - 4 wheels      Prior Function Level of Independence: Independent with assistive device(s)               Hand Dominance        Extremity/Trunk Assessment   Upper Extremity Assessment: Overall WFL for tasks assessed           Lower Extremity Assessment: Generalized weakness (grossly 4/5)         Communication   Communication: No difficulties  Cognition Arousal/Alertness: Awake/alert Behavior During Therapy: WFL for tasks assessed/performed Overall Cognitive Status: Within Functional Limits for tasks assessed                      General Comments      Exercises Other Exercises Other Exercises: Seated ther-ex performed including B alt. marching, LAQ, hip abd/add, and SLR x 10 reps with cga for correct  technique.       Assessment/Plan    PT Assessment Patient needs continued PT services  PT Diagnosis Difficulty walking;Generalized weakness   PT Problem List Decreased strength;Decreased balance;Decreased mobility  PT Treatment Interventions Gait training;Therapeutic exercise   PT Goals (Current goals can be found in the Care Plan section) Acute Rehab PT Goals Patient Stated Goal: to go back home PT Goal Formulation: With patient Time For Goal Achievement: 12/20/15 Potential to Achieve Goals: Good    Frequency Min 2X/week   Barriers to discharge        Co-evaluation               End of Session Equipment Utilized During Treatment: Gait belt Activity Tolerance: Patient tolerated treatment well Patient left:  in chair;with chair alarm set Nurse Communication: Mobility status    Functional Assessment Tool Used: clinical judgement Functional Limitation: Mobility: Walking and moving around Mobility: Walking and Moving Around Current Status [phone]): At least 1 percent but less than 20 percent impaired, limited or restricted Mobility: Walking and Moving Around Goal Status [phone]): 0 percent impaired, limited or restricted    Time: 1021-1041 PT Time Calculation (min) (ACUTE ONLY): 20 min   Charges:   PT Evaluation $PT Eval Moderate Complexity: 1 Procedure PT Treatments $Therapeutic Exercise: 8-22 mins   PT G Codes:   PT G-Codes **NOT FOR INPATIENT CLASS** Functional Assessment Tool Used: clinical judgement Functional Limitation: Mobility: Walking and moving around Mobility: Walking and Moving Around Current Status (W2956): At least 1 percent but less than 20 percent impaired, limited or restricted Mobility: Walking and Moving Around Goal Status [phone]): 0 percent impaired, limited or restricted    Latiana Tomei 12/06/2015, 11:17 AM  Elizabeth Palau, PT, DPT [phone]

## 2015-12-06 NOTE — Progress Notes (Signed)
CSW was informed that [facility] is from [facility]. CSW contacted Bill- Admissions Coordinator at [facility] to determine if patient is from their Independent Living, STR, or LTC. Per Bill patient is from their Independent Living facility. CSW informed RN Case Manager of above.There are no CSW needs at this time. CSW is available if a social work need were to arise. CSW is signing off.  Woodroe Modehristina Rushil Kimbrell, MSW, LCSW-A Clinical Social Work Department [phone]

## 2015-12-07 DIAGNOSIS — R001 Bradycardia, unspecified: Secondary | ICD-10-CM | POA: Diagnosis not present

## 2015-12-07 NOTE — Discharge Summary (Signed)
Kelly Booth, is a 82 y.o. female  DOB 02/16/1920  MRN [id number].  Admission date:  12/05/2015  Admitting Physician  Milagros Loll, MD  Discharge Date:  12/07/2015   Primary MD  No primary care provider on file.  Recommendations for primary care physician for things to follow:   Follow up with Dr.Parashos in one week   Admission Diagnosis  Symptomatic bradycardia [R00.1]   Discharge Diagnosis  Symptomatic bradycardia [R00.1]   Active Problems:   Bradycardia   Malnutrition of moderate degree      Past Medical History  Diagnosis Date  . A-fib (HCC)   . Glaucoma   . Hypertension   . Allergic rhinitis   . Pulmonary fibrosis (HCC)   . Esophageal reflux   . Dyspepsia   . Constipation   . Generalized weakness   . Osteoporosis     Past Surgical History  Procedure Laterality Date  . Total hip arthroplasty    . Abdominal hysterectomy         History of present illness and  Hospital Course:     Kindly see H&P for history of present illness and admission details, please review complete Labs, Consult reports and Test reports for all details in brief  HPI  from the history and physical done on the day of admission 82 yr old female with h/o Htn,chronic Afib,admitted for weakness and bradycardia.HR in 30 s when she came.admitted for bradycardia.   Hospital Course  1.bradycardia due to metoprolol;stopped metoprolol,and monitored on tele.seen by cardiology.BP is stable,EKG showed  Mobitz type 2 AV block with 2 to 1 conduction,Hr still in 40s but pt her mobitz type 2 second degree heart block is resolved after stopping metoprolol.so Dr.Paraschos advised the pt to seen in office in one week.so we discharged her home,d/c metoprolo. 2.chronic afib; 3.htn;controlled 4,GErd;continue PPI 5.Generalized weakness;PT  recommends out pt PT and OT>pt lives at [facility]  ,independant living .     Discharge Condition: stable   Follow UP  Follow-up Information    Follow up with PARASCHOS,ALEXANDER, MD In 1 week.   Specialty:  Cardiology   Contact information:   [address] [place] [address] [phone]         Discharge Instructions  and  Discharge Medications     Discharge Instructions    Ambulatory referral to Occupational Therapy    Complete by:  As directed      Ambulatory referral to Physical Therapy    Complete by:  As directed   Iontophoresis - 4 mg/ml of dexamethasone:  No  T.E.N.S. Unit Evaluation and Dispense as Indicated:  No            Medication List    STOP taking these medications        metoprolol succinate 50 MG 24 hr tablet  Commonly known as:  TOPROL-XL      TAKE these medications        amLODipine 5 MG tablet  Commonly known as:  NORVASC  Take 1 tablet by mouth daily.     ARTIFICIAL TEARS 1.4 % ophthalmic solution  Generic drug:  polyvinyl alcohol  Apply 1 drop to eye 4 (four) times daily as needed.     aspirin EC 81 MG tablet  Take 1 tablet by mouth daily.     calcium-vitamin D 500-200 MG-UNIT tablet  Take 1 tablet by mouth 2 (two) times daily.     ferrous sulfate 325 (65 FE)  MG tablet  Take 1 tablet by mouth daily.     furosemide 40 MG tablet  Commonly known as:  LASIX  Take 1 tablet by mouth daily.     Ipratropium-Albuterol 20-100 MCG/ACT Aers respimat  Commonly known as:  COMBIVENT  Inhale 1 puff into the lungs 4 (four) times daily.     isosorbide mononitrate 30 MG 24 hr tablet  Commonly known as:  IMDUR  Take 1 tablet by mouth daily.     latanoprost 0.005 % ophthalmic solution  Commonly known as:  XALATAN  Apply 1 drop to eye at bedtime.     ranitidine 150 MG tablet  Commonly known as:  ZANTAC  Take 1 tablet by mouth 2 (two) times daily.     STIMULANT LAXATIVE 5 MG EC tablet   Generic drug:  bisacodyl  Take 2 tablets by mouth daily.     Vitamin D3 1000 units Caps  Take 1 capsule by mouth daily.          Diet and Activity recommendation: See Discharge Instructions above   Consults obtained -cardiology   Major procedures and Radiology Reports - PLEASE review detailed and final reports for all details, in brief -     No results found.  Micro Results    No results found for this or any previous visit (from the past 240 hour(s)).     Today   Subjective:   Charles Niese today has no headache,no chest abdominal pain,no new weakness tingling or numbness, feels much better wants to go home today.   Objective:   Blood pressure 131/35, pulse 49, temperature 98.4 F (36.9 C), temperature source Oral, resp. rate 17, height  (1.575 m), weight 47.174 kg (104 lb), SpO2 93 %.   Intake/Output Summary (Last 24 hours) at 12/07/15 2230 Last data filed at 12/07/15 1209  Gross per 24 hour  Intake      3 ml  Output      0 ml  Net      3 ml    Exam Awake Alert, Oriented x 3, No new F.N deficits, Normal affect [place].AT,PERRAL Supple Neck,No JVD, No cervical lymphadenopathy appriciated.  Symmetrical Chest wall movement, Good air movement bilaterally, CTAB RRR,No Gallops,Rubs or new Murmurs, No Parasternal Heave +ve B.Sounds, Abd Soft, Non tender, No organomegaly appriciated, No rebound -guarding or rigidity. No Cyanosis, Clubbing or edema, No new Rash or bruise  Data Review   CBC w Diff: Lab Results  Component Value Date   WBC 10.4 12/05/2015   WBC 9.6 08/21/2014   HGB 11.1* 12/05/2015   HGB 12.6 08/21/2014   HCT 32.5* 12/05/2015   HCT 36.8 08/21/2014   PLT 224 12/05/2015   PLT 223 08/21/2014   LYMPHOPCT 7.9 10/15/2013   MONOPCT 7.5 10/15/2013   EOSPCT 0.0 10/15/2013   BASOPCT 0.5 10/15/2013    CMP: Lab Results  Component Value Date   NA 139 12/06/2015   NA 138 08/21/2014   K 3.8 12/06/2015   K 3.8 08/21/2014   CL 102 12/06/2015    CL 100 08/21/2014   CO2 29 12/06/2015   CO2 33* 08/21/2014   BUN 27* 12/06/2015   BUN 32* 08/21/2014   CREATININE 1.04* 12/06/2015   CREATININE 1.27 08/21/2014   PROT 7.1 12/05/2015   PROT 7.4 08/21/2014   ALBUMIN 3.6 12/05/2015   ALBUMIN 3.7 08/21/2014   BILITOT 0.8 12/05/2015   BILITOT 0.4 08/21/2014   ALKPHOS 56 12/05/2015   ALKPHOS 76 08/21/2014  AST 23 12/05/2015   AST 20 08/21/2014   ALT 13* 12/05/2015   ALT 18 08/21/2014  .   Total Time in preparing paper work, data evaluation and todays exam - 35 minutes  Savino Whisenant M.D on 12/07/2015 at 10:30 PM    Note: This dictation was prepared with Dragon dictation along with smaller phrase technology. Any transcriptional errors that result from this process are unintentional.

## 2015-12-07 NOTE — Care Management Note (Signed)
Case Management Note  Patient Details  Name: Kelly Booth MRN: [id number] Date of Birth: 09/04/1920  Subjective/Objective:      A prescription for outpatient PT and OT [name] Monday when [facility] reopen. No other home health needs identified.               Action/Plan:   Expected Discharge Date:                  Expected Discharge Plan:  Skilled Nursing Facility  In-House Referral:  Clinical Social Work  Discharge planning Services     Post Acute Care Choice:    Choice offered to:     DME Arranged:    DME Agency:     HH Arranged:    HH Agency:     Status of Service:  In process, will continue to follow  Medicare Important Message Given:    Date Medicare IM Given:    Medicare IM give by:    Date Additional Medicare IM Given:    Additional Medicare Important Message give by:     If discussed at Long Length of Stay Meetings, dates discussed:    Additional Comments:  Pamla Pangle A, RN 12/07/2015, 12:56 PM

## 2015-12-07 NOTE — Progress Notes (Signed)
Discharge instructions given to son and patient. IV's removed. Tele removed and CCMD notified. No new medications. Patient instructed to stop taking metoprolol. Education on bradycardia and 2nd degree heart block given. Patient will go back to [facility] and home health has been set up. No questions at this time.

## 2015-12-07 NOTE — Progress Notes (Addendum)
Notified by CCMD at 10:40am that patient switched back from afib into a 3rd degree heart block at 9:48am. Dr. Darrold JunkerParaschos had ordered an EKG that was performed at 10AM and MD confirmed 2nd degree heart block type 1. Confirmed with CCMD that it could be 2nd degree as well. No need for further action since EKG was already performed after switch. Patient will discharge from cardiology standpoint today.

## 2015-12-07 NOTE — Progress Notes (Signed)
[facility]  SUBJECTIVE: I feel better   Filed Vitals:   12/06/15 2001 12/06/15 2056 12/06/15 2058 12/07/15 0442  BP: 165/61  157/73 138/52  Pulse: 53  42 45  Temp: 97.4 F (36.3 C)   98.2 F (36.8 C)  TempSrc: Oral     Resp: 23   20  Height:      Weight:    47.174 kg (104 lb)  SpO2: 95% 96%  93%     Intake/Output Summary (Last 24 hours) at 12/07/15 1021 Last data filed at 12/07/15 1006  Gross per 24 hour  Intake    483 ml  Output    900 ml  Net   -417 ml      PHYSICAL EXAM  General: Well developed, well nourished, in no acute distress HEENT:  Normocephalic and atramatic Neck:  No JVD.  Lungs: Clear bilaterally to auscultation and percussion. Heart: HRRR . Normal S1 and S2 without gallops or murmurs.  Abdomen: Bowel sounds are positive, abdomen soft and non-tender  Msk:  Back normal, normal gait. Normal strength and tone for age. Extremities: No clubbing, cyanosis or edema.   Neuro: Alert and oriented X 3. Psych:  Good affect, responds appropriately   LABS: Basic Metabolic Panel:  Recent Labs  40/98/1101/11/21 0958 12/06/15 0451  NA 135 139  K 4.3 3.8  CL 96* 102  CO2 29 29  GLUCOSE 110* 99  BUN 36* 27*  CREATININE 1.39* 1.04*  CALCIUM 9.1 8.6*  MG 2.0  --    Liver Function Tests:  Recent Labs  12/05/15 0958  AST 23  ALT 13*  ALKPHOS 56  BILITOT 0.8  PROT 7.1  ALBUMIN 3.6   No results for input(s): LIPASE, AMYLASE in the last 72 hours. CBC:  Recent Labs  12/05/15 0958  WBC 10.4  HGB 11.1*  HCT 32.5*  MCV 96.6  PLT 224   Cardiac Enzymes:  Recent Labs  12/05/15 0958 12/05/15 1710 12/05/15 2306  TROPONINI 0.04* 0.06* 0.04*   BNP: Invalid input(s): POCBNP D-Dimer: No results for input(s): DDIMER in the last 72 hours. Hemoglobin A1C: No results for input(s): HGBA1C in the last 72 hours. Fasting Lipid Panel: No results for input(s): CHOL, HDL, LDLCALC, TRIG, CHOLHDL, LDLDIRECT in the last 72 hours. Thyroid Function Tests: No  results for input(s): TSH, T4TOTAL, T3FREE, THYROIDAB in the last 72 hours.  Invalid input(s): FREET3 Anemia Panel: No results for input(s): VITAMINB12, FOLATE, FERRITIN, TIBC, IRON, RETICCTPCT in the last 72 hours.  No results found.   Echo   TELEMETRY: Mobitz 1 second-degree AV block, heart rate 60-70 bpm  ASSESSMENT AND PLAN:  Active Problems:   Bradycardia   Malnutrition of moderate degree    1. Mobitz 2 second degree AV block, resolved, after holding metoprolol 2. History of paroxysmal atrial fibrillation, chads Vasc of 4, not on chronic anticoagulation due to advanced age  Recommendations  1. DC metoprolol succinate 2. Continue low-dose aspirin for stroke prevention 3. Defer permanent pacemaker implantation at this time   Marcina MillardPARASCHOS,Samul Mcinroy, MD, PhD, [facility] 12/07/2015 10:21 AM

## 2015-12-07 NOTE — Progress Notes (Signed)
Notified Dr. Luberta MutterKonidena that patient's pressure is currently 131/35 and heart rate is back down to 49-50. No new orders at this time. Per MD okay to discharge. Care management is waiting on orders for home health. Will wait for approval before sending patient home.

## 2015-12-07 NOTE — Progress Notes (Signed)
MD Dr. Tobi BastosPyreddy notified of pt's HR dropping into low 40s, as low as 39, and sustaining. Pt asymptomatic. Also notified that pt has been in a fib/flutter, pt has a fib history. No new orders given. RN will continue to monitor. Syliva Overmanassie A Eltha Tingley, RN

## 2015-12-24 ENCOUNTER — Emergency Department: Payer: Medicare Other

## 2015-12-24 ENCOUNTER — Emergency Department
Admission: EM | Admit: 2015-12-24 | Discharge: 2015-12-24 | Disposition: A | Discharge status: Home / Self Care | Payer: Medicare Other | Attending: Emergency Medicine | Admitting: Emergency Medicine

## 2015-12-24 DIAGNOSIS — R531 Weakness: Secondary | ICD-10-CM | POA: Diagnosis not present

## 2015-12-24 DIAGNOSIS — Z7982 Long term (current) use of aspirin: Secondary | ICD-10-CM | POA: Insufficient documentation

## 2015-12-24 DIAGNOSIS — Z79899 Other long term (current) drug therapy: Secondary | ICD-10-CM | POA: Insufficient documentation

## 2015-12-24 DIAGNOSIS — M810 Age-related osteoporosis without current pathological fracture: Secondary | ICD-10-CM | POA: Insufficient documentation

## 2015-12-24 DIAGNOSIS — W19XXXA Unspecified fall, initial encounter: Secondary | ICD-10-CM

## 2015-12-24 DIAGNOSIS — H409 Unspecified glaucoma: Secondary | ICD-10-CM | POA: Diagnosis not present

## 2015-12-24 DIAGNOSIS — I4891 Unspecified atrial fibrillation: Secondary | ICD-10-CM | POA: Insufficient documentation

## 2015-12-24 DIAGNOSIS — R410 Disorientation, unspecified: Secondary | ICD-10-CM | POA: Insufficient documentation

## 2015-12-24 DIAGNOSIS — J8410 Pulmonary fibrosis, unspecified: Secondary | ICD-10-CM | POA: Insufficient documentation

## 2015-12-24 DIAGNOSIS — I10 Essential (primary) hypertension: Secondary | ICD-10-CM | POA: Diagnosis not present

## 2015-12-24 DIAGNOSIS — M79602 Pain in left arm: Secondary | ICD-10-CM | POA: Diagnosis present

## 2015-12-24 LAB — URINALYSIS COMPLETE WITH MICROSCOPIC (ARMC ONLY)
BILIRUBIN URINE: NEGATIVE
Bacteria, UA: NONE SEEN
GLUCOSE, UA: NEGATIVE mg/dL
KETONES UR: NEGATIVE mg/dL
LEUKOCYTES UA: NEGATIVE
NITRITE: NEGATIVE
PH: 8 (ref 5.0–8.0)
Protein, ur: NEGATIVE mg/dL
SPECIFIC GRAVITY, URINE: 1.005 (ref 1.005–1.030)
SQUAMOUS EPITHELIAL / LPF: NONE SEEN
WBC, UA: NONE SEEN WBC/hpf (ref 0–5)

## 2015-12-24 LAB — COMPREHENSIVE METABOLIC PANEL
ALT: 16 U/L (ref 14–54)
AST: 25 U/L (ref 15–41)
Albumin: 4 g/dL (ref 3.5–5.0)
Alkaline Phosphatase: 58 U/L (ref 38–126)
Anion gap: 9 (ref 5–15)
BILIRUBIN TOTAL: 1.1 mg/dL (ref 0.3–1.2)
BUN: 29 mg/dL — ABNORMAL HIGH (ref 6–20)
CHLORIDE: 98 mmol/L — AB (ref 101–111)
CO2: 32 mmol/L (ref 22–32)
CREATININE: 1.05 mg/dL — AB (ref 0.44–1.00)
Calcium: 9.1 mg/dL (ref 8.9–10.3)
GFR, EST AFRICAN AMERICAN: 51 mL/min — AB (ref >60)
GFR, EST NON AFRICAN AMERICAN: 44 mL/min — AB (ref >60)
Glucose, Bld: 93 mg/dL (ref 65–99)
POTASSIUM: 3.4 mmol/L — AB (ref 3.5–5.1)
Sodium: 139 mmol/L (ref 135–145)
TOTAL PROTEIN: 7.2 g/dL (ref 6.5–8.1)

## 2015-12-24 LAB — CBC
HCT: 32.2 % — ABNORMAL LOW (ref 35.0–47.0)
Hemoglobin: 11.4 g/dL — ABNORMAL LOW (ref 12.0–16.0)
MCH: 34.4 pg — ABNORMAL HIGH (ref 26.0–34.0)
MCHC: 35.3 g/dL (ref 32.0–36.0)
MCV: 97.4 fL (ref 80.0–100.0)
PLATELETS: 215 10*3/uL (ref 150–440)
RBC: 3.31 MIL/uL — AB (ref 3.80–5.20)
RDW: 13.7 % (ref 11.5–14.5)
WBC: 10.9 10*3/uL (ref 3.6–11.0)

## 2015-12-24 LAB — TROPONIN I: Troponin I: 0.06 ng/mL — ABNORMAL HIGH (ref <0.031)

## 2015-12-24 MED ORDER — SODIUM CHLORIDE 0.9 % IV BOLUS (SEPSIS)
1000.0000 mL | Freq: Once | INTRAVENOUS | Status: AC
  Administered 2015-12-24: 1000 mL via INTRAVENOUS

## 2015-12-24 NOTE — ED Provider Notes (Signed)
[facility] Emergency Department Provider Note  Time seen: 8:21 PM  I have reviewed the triage vital signs and the nursing notes.   HISTORY  Chief Complaint Fall    HPI BRINLEIGH TEW is a 82 y.o. female with a past medical history of atrial fibrillation, hypertension, pulmonary fibrosis, presents the emergency department after a fall. According to the son the patient currently lives in an independent living facility. He states since yesterday she has been acting more weak and confused. They sent her to the hospital earlier today to have labs drawn. This evening while in bed they found the patient on the floor, possibly rolled out of bed.Patient is able to converse in the emergency department. States left arm pain but denies any other pain. Is not sure if she hit her head. States no pain as long she doesn't move, mild pain if she does move the left arm.     Past Medical History  Diagnosis Date  . A-fib (HCC)   . Glaucoma   . Hypertension   . Allergic rhinitis   . Pulmonary fibrosis (HCC)   . Esophageal reflux   . Dyspepsia   . Constipation   . Generalized weakness   . Osteoporosis     Patient Active Problem List   Diagnosis Date Noted  . Malnutrition of moderate degree 12/06/2015  . Bradycardia 12/05/2015    Past Surgical History  Procedure Laterality Date  . Total hip arthroplasty    . Abdominal hysterectomy      Current Outpatient Rx  Name  Route  Sig  Dispense  Refill  . amLODipine (NORVASC) 5 MG tablet   Oral   Take 1 tablet by mouth daily.         Marland Kitchen aspirin EC 81 MG tablet   Oral   Take 1 tablet by mouth daily.         . bisacodyl (STIMULANT LAXATIVE) 5 MG EC tablet   Oral   Take 2 tablets by mouth daily.         . Calcium Carbonate-Vitamin D (CALCIUM-VITAMIN D) 500-200 MG-UNIT tablet   Oral   Take 1 tablet by mouth 2 (two) times daily.         . Cholecalciferol (VITAMIN D3) 1000 units CAPS   Oral   Take 1 capsule  by mouth daily.         . ferrous sulfate 325 (65 FE) MG tablet   Oral   Take 1 tablet by mouth daily.         . furosemide (LASIX) 40 MG tablet   Oral   Take 1 tablet by mouth daily.         . Ipratropium-Albuterol (COMBIVENT) 20-100 MCG/ACT AERS respimat   Inhalation   Inhale 1 puff into the lungs 4 (four) times daily.         . isosorbide mononitrate (IMDUR) 30 MG 24 hr tablet   Oral   Take 1 tablet by mouth daily.         Marland Kitchen latanoprost (XALATAN) 0.005 % ophthalmic solution   Ophthalmic   Apply 1 drop to eye at bedtime.         . polyvinyl alcohol (ARTIFICIAL TEARS) 1.4 % ophthalmic solution   Ophthalmic   Apply 1 drop to eye 4 (four) times daily as needed.         . ranitidine (ZANTAC) 150 MG tablet   Oral   Take 1 tablet by mouth 2 (two)  times daily.           Allergies Review of patient's allergies indicates no known allergies.  Family History  Problem Relation Age of Onset  . Hypertension Other     Social History Social History  Substance Use Topics  . Smoking status: Never Smoker   . Smokeless tobacco: Never Used  . Alcohol Use: No    Review of Systems Constitutional: Negative for fever. Cardiovascular: Negative for chest pain. Respiratory: Negative for shortness of breath. Gastrointestinal: Negative for abdominal pain Musculoskeletal: Positive for left arm pain. Positive for right knee pain. Negative for neck or back pain. Neurological: Negative for headache 10-point ROS otherwise negative.  ____________________________________________   PHYSICAL EXAM:  VITAL SIGNS: ED Triage Vitals  Enc Vitals Group     BP 12/24/15 1859 192/62 mmHg     Pulse Rate 12/24/15 1859 80     Resp 12/24/15 1859 21     Temp 12/24/15 1859 97.9 F (36.6 C)     Temp Source 12/24/15 1859 Oral     SpO2 12/24/15 1859 97 %     Weight 12/24/15 1859 106 lb (48.081 kg)     Height --      Head Cir --      Peak Flow --      Pain Score 12/24/15 1900 5      Pain Loc --      Pain Edu? --      Excl. in GC? --     Constitutional: Alert. Overall well appearing, no distress. Patient does have somewhat slow responses. Son states it is more slow than normal. Eyes: Normal exam ENT   Head: Normocephalic and atraumatic   Mouth/Throat: Mucous membranes are moist. Cardiovascular: Normal rate, regular rhythm. No murmur Respiratory: Normal respiratory effort without tachypnea nor retractions. Breath sounds are clear. Nontender chest. Gastrointestinal: Soft and nontender. No distention. Musculoskeletal:  Mild hematoma with small skin tear to left arm/shoulder. Good range of motion in shoulder and elbow. Neurovascular intact distally. Patient does have moderate hematoma to right knee, good range of motion in right knee. Neurovascular intact distally. Pelvis is stable, good range of motion in bilateral hips. No neck or back tenderness. Neurologic:  Normal speech and language. No gross focal neurologic deficits  Skin:  Skin is warm, dry and intact.  Psychiatric: Mood and affect are normal. Speech and behavior are normal.  ____________________________________________    EKG  EKG reviewed and interpreted by myself shows normal sinus rhythm at 83 bpm, narrow QRS, normal axis, largely normal intervals, nonspecific ST changes.  ____________________________________________    RADIOLOGY  X-rays negative CT negative.  ____________________________________________    INITIAL IMPRESSION / ASSESSMENT AND PLAN / ED COURSE  Pertinent labs & imaging results that were available during my care of the patient were reviewed by me and considered in my medical decision making (see chart for details).  Patient presents the emergency department after a fall. Son states that the patient has been acting slower than normal. Patient is able to converse in the emergency department. His only complaining of mild left arm pain. Patient does have a small skin tear with a  small hematoma to the left arm. Overall the patient appears very well. We'll check labs, urinalysis, CT head, left shoulder x-ray, right knee x-ray.  X-rays of the left shoulder and right knee are negative for fracture. CT is negative for acute intracranial abnormality. Labs are within normal limits. Patient appears well. Son states he thinks she might  be a little dehydrated, we will administer IV fluids, and discharge the patient with primary care follow-up as soon as possible. The son is agreeable to this plan.  ____________________________________________   FINAL CLINICAL IMPRESSION(S) / ED DIAGNOSES  fall Weakness   Minna Antis, MD 12/24/15 2143

## 2015-12-24 NOTE — Discharge Instructions (Signed)
Please drink plenty of fluids of the next several days. Please follow-up with your primary care physician tomorrow for recheck/reevaluation. Please return to the emergency department for any increased weakness, or any further falls, or any other symptom personally concerning to yourself. Your workup today has been largely normal including blood work, urinalysis, x-ray imaging and a CT scan of your head.   Fatigue Fatigue is feeling tired all of the time, a lack of energy, or a lack of motivation. Occasional or mild fatigue is often a normal response to activity or life in general. However, long-lasting (chronic) or extreme fatigue may indicate an underlying medical condition. HOME CARE INSTRUCTIONS  Watch your fatigue for any changes. The following actions may help to lessen any discomfort you are feeling:  Talk to your health care provider about how much sleep you need each night. Try to get the required amount every night.  Take medicines only as directed by your health care provider.  Eat a healthy and nutritious diet. Ask your health care provider if you need help changing your diet.  Drink enough fluid to keep your urine clear or pale yellow.  Practice ways of relaxing, such as yoga, meditation, massage therapy, or acupuncture.  Exercise regularly.   Change situations that cause you stress. Try to keep your work and personal routine reasonable.  Do not abuse illegal drugs.  Limit alcohol intake to no more than 1 drink per day for nonpregnant women and 2 drinks per day for men. One drink equals 12 ounces of beer, 5 ounces of wine, or 1 ounces of hard liquor.  Take a multivitamin, if directed by your health care provider. SEEK MEDICAL CARE IF:   Your fatigue does not get better.  You have a fever.   You have unintentional weight loss or gain.  You have headaches.   You have difficulty:   Falling asleep.  Sleeping throughout the night.  You feel angry, guilty,  anxious, or sad.   You are unable to have a bowel movement (constipation).   You skin is dry.   Your legs or another part of your body is swollen.  SEEK IMMEDIATE MEDICAL CARE IF:   You feel confused.   Your vision is blurry.  You feel faint or pass out.   You have a severe headache.   You have severe abdominal, pelvic, or back pain.   You have chest pain, shortness of breath, or an irregular or fast heartbeat.   You are unable to urinate or you urinate less than normal.   You develop abnormal bleeding, such as bleeding from the rectum, vagina, nose, lungs, or nipples.  You vomit blood.   You have thoughts about harming yourself or committing suicide.   You are worried that you might harm someone else.    This information is not intended to replace advice given to you by your health care provider. Make sure you discuss any questions you have with your health care provider.   Document Released: 07/19/2007 Document Revised: 10/12/2014 Document Reviewed: 01/23/2014 Elsevier Interactive Patient Education 2016 [employer]  Fall Prevention in Hospitals, Adult As a hospital patient, your condition and the treatments you receive can increase your risk for falls. Some additional risk factors for falls in a hospital include:  Being in an unfamiliar environment.  Being on bed rest.  Your surgery.  Taking certain medicines.  Your tubing requirements, such as intravenous (IV) therapy or catheters. It is important that you learn how to decrease  fall risks while at the hospital. Below are important tips that can help prevent falls. SAFETY TIPS FOR PREVENTING FALLS Talk about your risk of falling.  Ask your health care provider why you are at risk for falling. Is it your medicine, illness, tubing placement, or something else?  Make a plan with your health care provider to keep you safe from falls.  Ask your health care provider or pharmacist about side  effects of your medicines. Some medicines can make you dizzy or affect your coordination. Ask for help.  Ask for help before getting out of bed. You may need to press your call button.  Ask for assistance in getting safely to the toilet.  Ask for a walker or cane to be put at your bedside. Ask that most of the side rails on your bed be placed up before your health care provider leaves the room.  Ask family or friends to sit with you.  Ask for things that are out of your reach, such as your glasses, hearing aids, telephone, bedside table, or call button. Follow these tips to avoid falling:  Stay lying or seated, rather than standing, while waiting for help.  Wear rubber-soled slippers or shoes whenever you walk in the hospital.  Avoid quick, sudden movements.  Change positions slowly.  Sit on the side of your bed before standing.  Stand up slowly and wait before you start to walk.  Let your health care provider know if there is a spill on the floor.  Pay careful attention to the medical equipment, electrical cords, and tubes around you.  When you need help, use your call button by your bed or in the bathroom. Wait for one of your health care providers to help you.  If you feel dizzy or unsure of your footing, return to bed and wait for assistance.  Avoid being distracted by the TV, telephone, or another person in your room.  Do not lean or support yourself on rolling objects, such as IV poles or bedside tables.   This information is not intended to replace advice given to you by your health care provider. Make sure you discuss any questions you have with your health care provider.   Document Released: 09/18/2000 Document Revised: 10/12/2014 Document Reviewed: 05/29/2012 Elsevier Interactive Patient Education [employer]

## 2015-12-24 NOTE — ED Notes (Addendum)
Pt bib EMS from independent living.  Pts family called out for fall that happened earlier today.  Pt has abrasion to L knee and L shoulder.  Pt has pitting 3+ edema to legs.  Pts family reports that pt responses were "slower", pt A/O x 4.  NAD.  Pts family sts that family has had decr appetite recently

## 2016-06-05 ENCOUNTER — Inpatient Hospital Stay
Admission: EM | Admit: 2016-06-05 | Discharge: 2016-06-11 | DRG: 377 | Disposition: A | Discharge status: Skilled Nursing Facility | Payer: Medicare Other | Attending: Internal Medicine | Admitting: Internal Medicine

## 2016-06-05 ENCOUNTER — Emergency Department: Payer: Medicare Other

## 2016-06-05 ENCOUNTER — Encounter: Payer: Self-pay | Admitting: Emergency Medicine

## 2016-06-05 DIAGNOSIS — H409 Unspecified glaucoma: Secondary | ICD-10-CM | POA: Diagnosis present

## 2016-06-05 DIAGNOSIS — K219 Gastro-esophageal reflux disease without esophagitis: Secondary | ICD-10-CM | POA: Diagnosis present

## 2016-06-05 DIAGNOSIS — K529 Noninfective gastroenteritis and colitis, unspecified: Secondary | ICD-10-CM | POA: Diagnosis present

## 2016-06-05 DIAGNOSIS — J9602 Acute respiratory failure with hypercapnia: Secondary | ICD-10-CM | POA: Diagnosis present

## 2016-06-05 DIAGNOSIS — E876 Hypokalemia: Secondary | ICD-10-CM | POA: Diagnosis not present

## 2016-06-05 DIAGNOSIS — K449 Diaphragmatic hernia without obstruction or gangrene: Secondary | ICD-10-CM | POA: Diagnosis present

## 2016-06-05 DIAGNOSIS — K920 Hematemesis: Secondary | ICD-10-CM | POA: Diagnosis present

## 2016-06-05 DIAGNOSIS — I5031 Acute diastolic (congestive) heart failure: Secondary | ICD-10-CM | POA: Diagnosis present

## 2016-06-05 DIAGNOSIS — N1330 Unspecified hydronephrosis: Secondary | ICD-10-CM | POA: Diagnosis present

## 2016-06-05 DIAGNOSIS — J90 Pleural effusion, not elsewhere classified: Secondary | ICD-10-CM | POA: Diagnosis present

## 2016-06-05 DIAGNOSIS — K921 Melena: Principal | ICD-10-CM | POA: Diagnosis present

## 2016-06-05 DIAGNOSIS — W19XXXA Unspecified fall, initial encounter: Secondary | ICD-10-CM | POA: Diagnosis present

## 2016-06-05 DIAGNOSIS — Z66 Do not resuscitate: Secondary | ICD-10-CM | POA: Diagnosis present

## 2016-06-05 DIAGNOSIS — M810 Age-related osteoporosis without current pathological fracture: Secondary | ICD-10-CM | POA: Diagnosis present

## 2016-06-05 DIAGNOSIS — R778 Other specified abnormalities of plasma proteins: Secondary | ICD-10-CM

## 2016-06-05 DIAGNOSIS — Z8249 Family history of ischemic heart disease and other diseases of the circulatory system: Secondary | ICD-10-CM

## 2016-06-05 DIAGNOSIS — I700 Atherosclerosis of aorta: Secondary | ICD-10-CM | POA: Diagnosis present

## 2016-06-05 DIAGNOSIS — N179 Acute kidney failure, unspecified: Secondary | ICD-10-CM | POA: Diagnosis present

## 2016-06-05 DIAGNOSIS — N189 Chronic kidney disease, unspecified: Secondary | ICD-10-CM | POA: Diagnosis present

## 2016-06-05 DIAGNOSIS — R011 Cardiac murmur, unspecified: Secondary | ICD-10-CM | POA: Diagnosis not present

## 2016-06-05 DIAGNOSIS — E860 Dehydration: Secondary | ICD-10-CM | POA: Diagnosis present

## 2016-06-05 DIAGNOSIS — D62 Acute posthemorrhagic anemia: Secondary | ICD-10-CM | POA: Diagnosis present

## 2016-06-05 DIAGNOSIS — Z515 Encounter for palliative care: Secondary | ICD-10-CM | POA: Diagnosis not present

## 2016-06-05 DIAGNOSIS — N390 Urinary tract infection, site not specified: Secondary | ICD-10-CM

## 2016-06-05 DIAGNOSIS — N289 Disorder of kidney and ureter, unspecified: Secondary | ICD-10-CM

## 2016-06-05 DIAGNOSIS — Z96649 Presence of unspecified artificial hip joint: Secondary | ICD-10-CM | POA: Diagnosis present

## 2016-06-05 DIAGNOSIS — I248 Other forms of acute ischemic heart disease: Secondary | ICD-10-CM | POA: Diagnosis present

## 2016-06-05 DIAGNOSIS — K922 Gastrointestinal hemorrhage, unspecified: Secondary | ICD-10-CM | POA: Diagnosis present

## 2016-06-05 DIAGNOSIS — D72829 Elevated white blood cell count, unspecified: Secondary | ICD-10-CM | POA: Diagnosis present

## 2016-06-05 DIAGNOSIS — I480 Paroxysmal atrial fibrillation: Secondary | ICD-10-CM | POA: Diagnosis present

## 2016-06-05 DIAGNOSIS — I350 Nonrheumatic aortic (valve) stenosis: Secondary | ICD-10-CM | POA: Diagnosis present

## 2016-06-05 DIAGNOSIS — I130 Hypertensive heart and chronic kidney disease with heart failure and stage 1 through stage 4 chronic kidney disease, or unspecified chronic kidney disease: Secondary | ICD-10-CM | POA: Diagnosis present

## 2016-06-05 DIAGNOSIS — R531 Weakness: Secondary | ICD-10-CM

## 2016-06-05 DIAGNOSIS — I4891 Unspecified atrial fibrillation: Secondary | ICD-10-CM

## 2016-06-05 DIAGNOSIS — Z789 Other specified health status: Secondary | ICD-10-CM | POA: Diagnosis not present

## 2016-06-05 DIAGNOSIS — J810 Acute pulmonary edema: Secondary | ICD-10-CM | POA: Diagnosis not present

## 2016-06-05 DIAGNOSIS — R0600 Dyspnea, unspecified: Secondary | ICD-10-CM | POA: Diagnosis not present

## 2016-06-05 DIAGNOSIS — J9601 Acute respiratory failure with hypoxia: Secondary | ICD-10-CM | POA: Diagnosis not present

## 2016-06-05 DIAGNOSIS — R0602 Shortness of breath: Secondary | ICD-10-CM

## 2016-06-05 DIAGNOSIS — R7989 Other specified abnormal findings of blood chemistry: Secondary | ICD-10-CM

## 2016-06-05 DIAGNOSIS — Z7982 Long term (current) use of aspirin: Secondary | ICD-10-CM

## 2016-06-05 LAB — CBC
HEMATOCRIT: 30.7 % — AB (ref 35.0–47.0)
Hemoglobin: 10.9 g/dL — ABNORMAL LOW (ref 12.0–16.0)
MCH: 34.2 pg — ABNORMAL HIGH (ref 26.0–34.0)
MCHC: 35.5 g/dL (ref 32.0–36.0)
MCV: 96.4 fL (ref 80.0–100.0)
PLATELETS: 186 10*3/uL (ref 150–440)
RBC: 3.18 MIL/uL — ABNORMAL LOW (ref 3.80–5.20)
RDW: 14.1 % (ref 11.5–14.5)
WBC: 19.3 10*3/uL — ABNORMAL HIGH (ref 3.6–11.0)

## 2016-06-05 LAB — BASIC METABOLIC PANEL
Anion gap: 8 (ref 5–15)
BUN: 41 mg/dL — AB (ref 6–20)
CHLORIDE: 99 mmol/L — AB (ref 101–111)
CO2: 31 mmol/L (ref 22–32)
CREATININE: 1.28 mg/dL — AB (ref 0.44–1.00)
Calcium: 9.4 mg/dL (ref 8.9–10.3)
GFR calc Af Amer: 40 mL/min — ABNORMAL LOW (ref >60)
GFR, EST NON AFRICAN AMERICAN: 35 mL/min — AB (ref >60)
GLUCOSE: 101 mg/dL — AB (ref 65–99)
POTASSIUM: 3.7 mmol/L (ref 3.5–5.1)
Sodium: 138 mmol/L (ref 135–145)

## 2016-06-05 LAB — PHOSPHORUS: Phosphorus: 3.1 mg/dL (ref 2.5–4.6)

## 2016-06-05 LAB — MAGNESIUM: Magnesium: 1.9 mg/dL (ref 1.7–2.4)

## 2016-06-05 MED ORDER — SODIUM CHLORIDE 0.9 % IV BOLUS (SEPSIS)
1000.0000 mL | Freq: Once | INTRAVENOUS | Status: AC
  Administered 2016-06-05: 1000 mL via INTRAVENOUS

## 2016-06-05 MED ORDER — MAGNESIUM CITRATE PO SOLN: 1.0000 | Freq: Once | ORAL | Status: DC | PRN

## 2016-06-05 MED ORDER — BISACODYL 5 MG PO TBEC
10.0000 mg | DELAYED_RELEASE_TABLET | Freq: Every day | ORAL | Status: DC
  Administered 2016-06-08 – 2016-06-11 (×4): 10 mg via ORAL
  Filled 2016-06-05 (×4): qty 2

## 2016-06-05 MED ORDER — SODIUM CHLORIDE 0.9 % IV SOLN
INTRAVENOUS | Status: DC
  Administered 2016-06-05 – 2016-06-06 (×3): via INTRAVENOUS

## 2016-06-05 MED ORDER — ISOSORBIDE MONONITRATE ER 30 MG PO TB24: 30.0000 mg | ORAL_TABLET | Freq: Every day | ORAL | Status: DC

## 2016-06-05 MED ORDER — SENNOSIDES-DOCUSATE SODIUM 8.6-50 MG PO TABS: 1.0000 | ORAL_TABLET | Freq: Every evening | ORAL | Status: DC | PRN

## 2016-06-05 MED ORDER — ACETAMINOPHEN 325 MG PO TABS
650.0000 mg | ORAL_TABLET | Freq: Four times a day (QID) | ORAL | Status: DC | PRN
Start: 2016-06-05 — End: 2016-06-11

## 2016-06-05 MED ORDER — POLYVINYL ALCOHOL 1.4 % OP SOLN
1.0000 [drp] | Freq: Four times a day (QID) | OPHTHALMIC | Status: DC | PRN
  Administered 2016-06-07: 1 [drp] via OPHTHALMIC
  Filled 2016-06-05: qty 15

## 2016-06-05 MED ORDER — VITAMIN D 1000 UNITS PO TABS: 1000.0000 [IU] | ORAL_TABLET | Freq: Every day | ORAL | Status: DC

## 2016-06-05 MED ORDER — ONDANSETRON HCL 4 MG PO TABS: 4.0000 mg | ORAL_TABLET | Freq: Four times a day (QID) | ORAL | Status: DC | PRN

## 2016-06-05 MED ORDER — IPRATROPIUM-ALBUTEROL 0.5-2.5 (3) MG/3ML IN SOLN
3.0000 mL | Freq: Four times a day (QID) | RESPIRATORY_TRACT | Status: DC
  Administered 2016-06-06 – 2016-06-07 (×6): 3 mL via RESPIRATORY_TRACT
  Filled 2016-06-05 (×6): qty 3

## 2016-06-05 MED ORDER — FERROUS SULFATE 325 (65 FE) MG PO TABS: 325.0000 mg | ORAL_TABLET | Freq: Every day | ORAL | Status: DC

## 2016-06-05 MED ORDER — ENOXAPARIN SODIUM 30 MG/0.3ML ~~LOC~~ SOLN
30.0000 mg | Freq: Every day | SUBCUTANEOUS | Status: DC
  Administered 2016-06-06: 01:00:00 30 mg via SUBCUTANEOUS
  Filled 2016-06-05: qty 0.3

## 2016-06-05 MED ORDER — LATANOPROST 0.005 % OP SOLN
1.0000 [drp] | Freq: Every day | OPHTHALMIC | Status: DC
  Administered 2016-06-06 – 2016-06-10 (×4): 1 [drp] via OPHTHALMIC
  Filled 2016-06-05 (×2): qty 2.5

## 2016-06-05 MED ORDER — SODIUM CHLORIDE 0.9 % IV SOLN
8.0000 mg/h | INTRAVENOUS | Status: DC
  Administered 2016-06-05 – 2016-06-06 (×3): 8 mg/h via INTRAVENOUS
  Filled 2016-06-05 (×3): qty 80

## 2016-06-05 MED ORDER — SODIUM CHLORIDE 0.9% FLUSH
3.0000 mL | Freq: Two times a day (BID) | INTRAVENOUS | Status: DC
  Administered 2016-06-06 – 2016-06-11 (×8): 3 mL via INTRAVENOUS

## 2016-06-05 MED ORDER — FUROSEMIDE 40 MG PO TABS: 40.0000 mg | ORAL_TABLET | Freq: Every day | ORAL | Status: DC

## 2016-06-05 MED ORDER — AMLODIPINE BESYLATE 5 MG PO TABS: 5.0000 mg | ORAL_TABLET | Freq: Every day | ORAL | Status: DC

## 2016-06-05 MED ORDER — ACETAMINOPHEN 650 MG RE SUPP: 650.0000 mg | Freq: Four times a day (QID) | RECTAL | Status: DC | PRN

## 2016-06-05 MED ORDER — SODIUM CHLORIDE 0.9 % IV SOLN
80.0000 mg | Freq: Once | INTRAVENOUS | Status: AC
  Administered 2016-06-05: 80 mg via INTRAVENOUS
  Filled 2016-06-05: qty 80

## 2016-06-05 MED ORDER — CALCIUM CARBONATE-VITAMIN D 500-200 MG-UNIT PO TABS
1.0000 | ORAL_TABLET | Freq: Two times a day (BID) | ORAL | Status: DC
  Administered 2016-06-06 – 2016-06-11 (×10): 1 via ORAL
  Filled 2016-06-05 (×11): qty 1

## 2016-06-05 MED ORDER — ZOLPIDEM TARTRATE 5 MG PO TABS
5.0000 mg | ORAL_TABLET | Freq: Every evening | ORAL | Status: DC | PRN
  Administered 2016-06-08: 5 mg via ORAL
  Filled 2016-06-05: qty 1

## 2016-06-05 MED ORDER — ONDANSETRON HCL 4 MG/2ML IJ SOLN: 4.0000 mg | Freq: Four times a day (QID) | INTRAMUSCULAR | Status: DC | PRN

## 2016-06-05 MED ORDER — ONDANSETRON HCL 4 MG/2ML IJ SOLN
INTRAMUSCULAR | Status: AC
  Administered 2016-06-05: 4 mg
  Filled 2016-06-05: qty 2

## 2016-06-05 MED ORDER — BISACODYL 5 MG PO TBEC: 5.0000 mg | DELAYED_RELEASE_TABLET | Freq: Every day | ORAL | Status: DC | PRN

## 2016-06-05 MED ORDER — HYDROCODONE-ACETAMINOPHEN 5-325 MG PO TABS: 1.0000 | ORAL_TABLET | ORAL | Status: DC | PRN

## 2016-06-05 NOTE — ED Triage Notes (Signed)
[facility]. Per family increased generalized weakness over the past couple days. Per family pt had unwitnessed fall today around 1500. Per facility decreased appetite. Pt family states up all night with diarrhea and blood in stool per pt. Pt A&Ox4

## 2016-06-05 NOTE — ED Provider Notes (Signed)
[facility] Emergency Department Provider Note  Time seen: 6:57 PM  I have reviewed the triage vital signs and the nursing notes.   HISTORY  Chief Complaint Weakness    HPI Kelly Booth is a 82 y.o. female with a past medical history of atrial fibrillation, hypertension, who presents the emergency department for generalized weakness and a fall. According to the patient she was up all last night with diarrhea, states she notices blood in the toilet as well. Today she has felt extremely weak and had a fall falling backwards, does not believe she hit her head. Did not pass out. Denies any headache. Patient denies any symptoms at this time besides just feeling generalized weakness. Grandson states the patient lives in independent living, is able to care for herself, but is very weak today compared to her normal self.  Past Medical History:  Diagnosis Date  . A-fib (HCC)   . Allergic rhinitis   . Constipation   . Dyspepsia   . Esophageal reflux   . Generalized weakness   . Glaucoma   . Hypertension   . Osteoporosis   . Pulmonary fibrosis [facility](HCC)     Patient Active Problem List   Diagnosis Date Noted  . Malnutrition of moderate degree 12/06/2015  . Bradycardia 12/05/2015    Past Surgical History:  Procedure Laterality Date  . ABDOMINAL HYSTERECTOMY    . TOTAL HIP ARTHROPLASTY      Prior to Admission medications   Medication Sig Start Date End Date Taking? Authorizing Provider  amLODipine (NORVASC) 5 MG tablet Take 1 tablet by mouth daily. 09/18/15   Historical Provider, MD  aspirin EC 81 MG tablet Take 1 tablet by mouth daily. 09/18/15   Historical Provider, MD  bisacodyl (STIMULANT LAXATIVE) 5 MG EC tablet Take 2 tablets by mouth daily. 09/18/15   Historical Provider, MD  Calcium Carbonate-Vitamin D (CALCIUM-VITAMIN D) 500-200 MG-UNIT tablet Take 1 tablet by mouth 2 (two) times daily. 09/18/15   Historical Provider, MD  Cholecalciferol (VITAMIN D3)  1000 units CAPS Take 1 capsule by mouth daily. 09/18/15   Historical Provider, MD  ferrous sulfate 325 (65 FE) MG tablet Take 1 tablet by mouth daily. 09/18/15   Historical Provider, MD  furosemide (LASIX) 40 MG tablet Take 1 tablet by mouth daily. 09/18/15   Historical Provider, MD  Ipratropium-Albuterol (COMBIVENT) 20-100 MCG/ACT AERS respimat Inhale 1 puff into the lungs 4 (four) times daily. 09/18/15   Historical Provider, MD  isosorbide mononitrate (IMDUR) 30 MG 24 hr tablet Take 1 tablet by mouth daily. 09/18/15   Historical Provider, MD  latanoprost (XALATAN) 0.005 % ophthalmic solution Apply 1 drop to eye at bedtime. 09/18/15   Historical Provider, MD  polyvinyl alcohol (ARTIFICIAL TEARS) 1.4 % ophthalmic solution Apply 1 drop to eye 4 (four) times daily as needed. 09/18/15   Historical Provider, MD  ranitidine (ZANTAC) 150 MG tablet Take 1 tablet by mouth 2 (two) times daily. 09/18/15   Historical Provider, MD    No Known Allergies  Family History  Problem Relation Age of Onset  . Hypertension Other     Social History Social History  Substance Use Topics  . Smoking status: Never Smoker  . Smokeless tobacco: Never Used  . Alcohol use No    Review of Systems Constitutional: Negative for fever. Cardiovascular: Negative for chest pain. Respiratory: Negative for shortness of breath. Gastrointestinal: Negative for abdominal pain. Positive for rectal bleeding Neurological: Negative for headache 10-point ROS otherwise negative.  ____________________________________________   PHYSICAL EXAM:  VITAL SIGNS: ED Triage Vitals  Enc Vitals Group     BP 06/05/16 1816 (!) 156/62     Pulse Rate 06/05/16 1816 81     Resp 06/05/16 1816 14     Temp 06/05/16 1816 98.5 F (36.9 C)     Temp Source 06/05/16 1816 Oral     SpO2 06/05/16 1816 100 %     Weight 06/05/16 1818 100 lb (45.4 kg)     Height 06/05/16 1818 5\' 3"  (1.6 m)     Head Circumference --      Peak Flow --      Pain  Score 06/05/16 1819 0     Pain Loc --      Pain Edu? --      Excl. in GC? --     Constitutional: Alert. Well appearing and in no distress. Eyes: Normal exam ENT   Head: Normocephalic and atraumatic.   Mouth/Throat: Mucous membranes are moist. Cardiovascular: Normal rate, regular rhythm. No murmur Respiratory: Normal respiratory effort without tachypnea nor retractions. Breath sounds are clear, equal bilaterally. Gastrointestinal: Soft and nontender. No distention. Musculoskeletal: Moderate lower extremity edema, grandson states largely unchanged. Neurologic:  Normal speech and language. No gross focal neurologic deficits Skin:  Skin is warm, dry and intact.  Psychiatric: Mood and affect are normal.   ____________________________________________    EKG  EKG reviewed and interpreted by myself shows normal sinus rhythm at 84 bpm, narrow QRS, normal axis, normal intervals, nonspecific ST changes no ST elevation, overall reassuring EKG.  ____________________________________________    RADIOLOGY  CT without contrast largely within normal limits.  ____________________________________________   INITIAL IMPRESSION / ASSESSMENT AND PLAN / ED COURSE  Pertinent labs & imaging results that were available during my care of the patient were reviewed by me and considered in my medical decision making (see chart for details).  Patient presents the emergency department generalized weakness. States she was up all night with diarrhea with blood in her stool. Rectal exam shows no hemorrhoids, nontender exam strongly guaiac positive. Patient has no abdominal tenderness but white blood cell count of 19,000. We'll proceed with CT abdomen/pelvis without contrast given her GFR of 34. Anticipated admission to the hospital. Patient's creatinine is elevated over baseline we will IV hydrate while awaiting CT scan results.  CT largely within normal limits. Labs show noted a white blood cell count,  and stable H&H. Given the patient's weakness with a lower GI bleed we will admit to the hospitalist service for further treatment. I started patient on Protonix.  ____________________________________________   FINAL CLINICAL IMPRESSION(S) / ED DIAGNOSES  GI bleed    Minna Antis, MD 06/05/16 2037

## 2016-06-05 NOTE — ED Notes (Signed)
MD at bedside. 

## 2016-06-05 NOTE — H&P (Addendum)
[facility] - [place] @ [facility] Admission History and Physical [employer], D.O.  ---------------------------------------------------------------------------------------------------------------------   PATIENT NAME: Kelly Booth MR#: [id number]   DATE OF BIRTH: 02-Jan-1920 DATE OF ADMISSION: 06/05/2016 PRIMARY CARE PHYSICIAN: No primary care provider on file.  REQUESTING/REFERRING PHYSICIAN: ED Dr. Lenard Lance  CHIEF COMPLAINT: Chief Complaint  Patient presents with  . Weakness    HISTORY OF PRESENT ILLNESS: Kelly Booth is a 82 y.o. female with a known history of Atrial fibrillation, hypertension, pulmonary fibrosis was in a usual state of health until last night when she reports experiencing diarrhea during the night. She reportedly told her son and grandson that she was having loose stools with significant amount of blood in it. She reports recent decrease in appetite, generalized weakness but denies fevers, chills, abdominal pain, nausea or vomiting. She did vomit once in the emergency department which was dark red and bloody. The patient lives in an independent living facility where her son checks on her twice per day.  Otherwise there has been no change in status. Patient has been taking medication as prescribed and there has been no recent change in medication or diet.  There has been no recent illness, travel or sick contacts.    Patient denies fevers/chills,dizziness, chest pain, shortness of breath, N/V/C/D, abdominal pain, dysuria/frequency, changes in mental status.   PAST MEDICAL HISTORY: Past Medical History:  Diagnosis Date  . A-fib (HCC)   . Allergic rhinitis   . Constipation   . Dyspepsia   . Esophageal reflux   . Generalized weakness   . Glaucoma   . Hypertension   . Osteoporosis   . Pulmonary fibrosis (HCC)       PAST SURGICAL HISTORY: Past Surgical History:  Procedure Laterality Date  . ABDOMINAL HYSTERECTOMY    . TOTAL HIP ARTHROPLASTY         SOCIAL HISTORY: Social History  Substance Use Topics  . Smoking status: Never Smoker  . Smokeless tobacco: Never Used  . Alcohol use No      FAMILY HISTORY: Family History  Problem Relation Age of Onset  . Hypertension Other      MEDICATIONS AT HOME: Prior to Admission medications   Medication Sig Start Date End Date Taking? Authorizing Provider  amLODipine (NORVASC) 5 MG tablet Take 1 tablet by mouth daily. 09/18/15   Historical Provider, MD  aspirin EC 81 MG tablet Take 1 tablet by mouth daily. 09/18/15   Historical Provider, MD  bisacodyl (STIMULANT LAXATIVE) 5 MG EC tablet Take 2 tablets by mouth daily. 09/18/15   Historical Provider, MD  Calcium Carbonate-Vitamin D (CALCIUM-VITAMIN D) 500-200 MG-UNIT tablet Take 1 tablet by mouth 2 (two) times daily. 09/18/15   Historical Provider, MD  Cholecalciferol (VITAMIN D3) 1000 units CAPS Take 1 capsule by mouth daily. 09/18/15   Historical Provider, MD  ferrous sulfate 325 (65 FE) MG tablet Take 1 tablet by mouth daily. 09/18/15   Historical Provider, MD  furosemide (LASIX) 40 MG tablet Take 1 tablet by mouth daily. 09/18/15   Historical Provider, MD  Ipratropium-Albuterol (COMBIVENT) 20-100 MCG/ACT AERS respimat Inhale 1 puff into the lungs 4 (four) times daily. 09/18/15   Historical Provider, MD  isosorbide mononitrate (IMDUR) 30 MG 24 hr tablet Take 1 tablet by mouth daily. 09/18/15   Historical Provider, MD  latanoprost (XALATAN) 0.005 % ophthalmic solution Apply 1 drop to eye at bedtime. 09/18/15   Historical Provider, MD  polyvinyl alcohol (ARTIFICIAL TEARS) 1.4 % ophthalmic solution Apply 1 drop to  eye 4 (four) times daily as needed. 09/18/15   Historical Provider, MD  ranitidine (ZANTAC) 150 MG tablet Take 1 tablet by mouth 2 (two) times daily. 09/18/15   Historical Provider, MD      DRUG ALLERGIES: No Known Allergies   REVIEW OF SYSTEMS: CONSTITUTIONAL: No fever/chills, fatigue, weight gain/loss, headache.  Positive weakness EYES: No blurry or double vision. ENT: No tinnitus, postnasal drip, redness or soreness of the oropharynx. RESPIRATORY: No cough, wheeze, hemoptysis, dyspnea. CARDIOVASCULAR: No chest pain, orthopnea, palpitations, syncope. GASTROINTESTINAL: No constipation,abdominal pain, hematemesis. Marland Kitchen Positive hematochezia GENITOURINARY: No dysuria or hematuria. ENDOCRINE: No polyuria or nocturia. No heat or cold intolerance. HEMATOLOGY: No anemia, bruising, bleeding. INTEGUMENTARY: No rashes, ulcers, lesions. MUSCULOSKELETAL: No arthritis, swelling, gout. NEUROLOGIC: No numbness, tingling, weakness or ataxia. No seizure-type activity. PSYCHIATRIC: No anxiety, depression, insomnia.  PHYSICAL EXAMINATION: VITAL SIGNS: Blood pressure (!) 125/53, pulse 93, temperature 98.5 F (36.9 C), temperature source Oral, resp. rate (!) 27, height 5\' 3"  (1.6 m), weight 45.4 kg (100 lb), SpO2 95 %.  GENERAL: 82 y.o.-year-old white female patient, frail, fatigued, lying in the bed in no acute distress.  Pleasant and cooperative.   HEENT: Head atraumatic, normocephalic. Pupils equal, round, reactive to light and accommodation. No scleral icterus. Extraocular muscles intact. Nares are patent. Oropharynx is clear. Mucus membranes moist. NECK: Supple, full range of motion. No JVD, no bruit heard. No thyroid enlargement, no tenderness, no cervical lymphadenopathy. CHEST: Normal breath sounds bilaterally. No wheezing, rales, rhonchi or crackles. No use of accessory muscles of respiration.  No reproducible chest wall tenderness.  CARDIOVASCULAR: S1, S2 normal. No murmurs, rubs, or gallops. Cap refill <2 seconds. ABDOMEN: Soft, nontender, nondistended. No rebound, guarding, rigidity. Normoactive bowel sounds present in all four quadrants. No organomegaly or mass. EXTREMITIES: Full range of motion. Mild bilateral lower extremity edema to the ankle. No cyanosis, or clubbing. NEUROLOGIC: Cranial nerves II through  XII are grossly intact with no focal sensorimotor deficit. Muscle strength 5/5 in all extremities. Sensation intact. Gait not checked. PSYCHIATRIC: The patient is alert and oriented x 3. Normal affect, mood, thought content. SKIN: Warm, dry, and intact without obvious rash, lesion, or ulcer.  LABORATORY PANEL:  CBC  Recent Labs Lab 06/05/16 1821  WBC 19.3*  HGB 10.9*  HCT 30.7*  PLT 186   ----------------------------------------------------------------------------------------------------------------- Chemistries  Recent Labs Lab 06/05/16 1821  NA 138  K 3.7  CL 99*  CO2 31  GLUCOSE 101*  BUN 41*  CREATININE 1.28*  CALCIUM 9.4   ------------------------------------------------------------------------------------------------------------------ Cardiac Enzymes No results for input(s): TROPONINI in the last 168 hours. ------------------------------------------------------------------------------------------------------------------  RADIOLOGY: Ct Abdomen Pelvis Wo Contrast  Result Date: 06/05/2016 CLINICAL DATA:  Generalized weakness, unwitnessed fall today with decreased appetite, diarrhea with blood in stool all night, history hypertension, pulmonary fibrosis, esophageal reflux, atrial fibrillation EXAM: CT ABDOMEN AND PELVIS WITHOUT CONTRAST TECHNIQUE: Multidetector CT imaging of the abdomen and pelvis was performed following the standard protocol without IV contrast. Oral contrast was not administered. Sagittal and coronal MPR images reconstructed from axial data set. Examinations dated limited by the lack of contrast and the lack inadequate intra-abdominal tissue planes as well as beam hardening artifacts in the pelvis. COMPARISON:  None FINDINGS: Lower chest:  LEFT basilar atelectasis. Hepatobiliary: Low-attenuation lesion in LEFT lobe liver 8 mm diameter image 8 question small cyst. Remainder of liver normal appearance. Gallbladder unremarkable. Pancreas: Suboptimally  visualized due to lack of contrast, no gross abnormality seen. Spleen: Normal morphology Adrenals/Urinary Tract: Adrenal glands poorly  visualized. Distal ureters and bladder are obscured by beam hardening artifacts. LEFT kidney normal morphology without mass or hydronephrosis. RIGHT hydronephrosis with effacement of renal sinus fat of uncertain etiology ; the proximal portion of the RIGHT ureter appears slightly dilated but the mid to distal portions are not visualized. Tiny nonobstructing calculi at inferior poles of both kidneys. Stomach/Bowel: Appendix not visualized. Stomach decompressed, cannot exclude wall thickening. Small hiatal hernia. Pelvic bowel loops obscured by beam hardening artifacts. Visualized upper and mid abdominal bowel loops grossly unremarkable. Vascular/Lymphatic: Extensive atherosclerotic calcifications without aneurysm Dense mitral annular calcification.  No gross adenopathy. Reproductive: Unable to assess due to beam hardening artifacts Other: No gross evidence of free air or free fluid. No definite hernia. Musculoskeletal: BILATERAL hip prostheses with extensive beam hardening artifacts in pelvis. Diffuse osseous demineralization. Old LEFT rib fractures. Old RIGHT inferior pubic ramus fracture. IMPRESSION: Tiny BILATERAL nonobstructing renal calculi. Additionally, RIGHT hydronephrosis and proximal hydroureter though cause is uncertain, with mid and distal portions of the RIGHT ureter inadequately visualized. Small hiatal hernia. Question tiny hepatic cyst. Aortic atherosclerosis. Electronically Signed   By: Ulyses Southward M.D.   On: 06/05/2016 19:38    EKG: Sinus at 84 bpm with normal axis, nonspecific ST and T wave changes  IMPRESSION AND PLAN:  This is a 82 y.o. female with a history of atrial fibrillation, hypertension, urinary fibrosis now being admitted with: 1. Lower GI bleed-H&H is stable however patient has had ongoing bloody stools and hematemesis in the emergency  department -Admit patient to med-surg with telemetry monitoring -IV Protonix drip -Serial CBCs -Nothing by mouth -GI consult -Hold aspirin. 2. Leukocytosis-source unclear possibly related to gastroenteritis given patient's recent symptoms. She also has not been on any steroids recently.. Patient denies having any symptoms such as cough, abdominal pain, dysuria -Blood urine and sputum cultures -Stool studies including culture, white blood cells, ova and parasites, C. Difficile -Repeat CBC in the a.m. 3. AKI possibly secondary to right hydronephrosis with hydroureter -IV fluid hydration -Consult urology 4. Atrial fibrillation, rate controlled-continue home medications 5. Hypertension,-continue amlodipine, Lasix, Imdur 6. Pulmonary fibrosis-continue Combivent 7. Osteoporosis-continue vitamin D and calcium 8. Iron deficiency-continue ferrous sulfate 9. Glaucoma-continue eye drops and artificial tears 10. GERD-IV Protonix drip per #1  Diet/Nutrition: nothing by mouth Fluids: IV normal saline  DVT Px: SCDs and early ambulation . Chemoprophylaxis will be contraindicated at this point secondary to active GI bleed Code Status: Full. Discussed advanced checked his with the patient's grandson who believes that she is a full code but he will discuss with his father who is her primary healthcare proxy. Patient previously had a DNR during hospitalization however, now she remains a full code **please see addendum  All the records are reviewed and case discussed with ED provider. Management plans discussed with the patient and/or family who express understanding and agree with plan of care.   TOTAL TIME TAKING CARE OF THIS PATIENT: 50 minutes.   Koichi Platte D.O. on 06/05/2016 at 9:19 PM Between 7am to 6pm - Pager - [phone] After 6pm go to [email or web address] - Biomedical engineer [place] [facility] Office [phone] CC: Primary care physician; No primary care provider on  file.     Note: This dictation was prepared with Dragon dictation along with smaller phrase technology. Any transcriptional errors that result from this process are unintentional.   ADDENDUM: Received call from nursing that the patient's grandson confirmed with his father that the patient is a DO NOT RESUSCITATE and would  not like to be resuscitated via heroic measures. I will change the order in the chart.

## 2016-06-05 NOTE — ED Notes (Signed)
Pt placed on bedpan at this time with no urine return

## 2016-06-05 NOTE — ED Notes (Signed)
Patient report called to floor by Bing Reeoles, RN - report given. Patient left ED at 2250.

## 2016-06-06 LAB — CBC
HCT: 26.4 % — ABNORMAL LOW (ref 35.0–47.0)
HCT: 25.7 % — ABNORMAL LOW (ref 35.0–47.0)
HEMATOCRIT: 25.7 % — AB (ref 35.0–47.0)
HEMATOCRIT: 26.1 % — AB (ref 35.0–47.0)
HEMOGLOBIN: 8.8 g/dL — AB (ref 12.0–16.0)
HEMOGLOBIN: 9 g/dL — AB (ref 12.0–16.0)
HEMOGLOBIN: 8.9 g/dL — AB (ref 12.0–16.0)
Hemoglobin: 9 g/dL — ABNORMAL LOW (ref 12.0–16.0)
MCH: 33.4 pg (ref 26.0–34.0)
MCH: 33 pg (ref 26.0–34.0)
MCH: 33.3 pg (ref 26.0–34.0)
MCH: 33.5 pg (ref 26.0–34.0)
MCHC: 34 g/dL (ref 32.0–36.0)
MCHC: 34.2 g/dL (ref 32.0–36.0)
MCHC: 34.4 g/dL (ref 32.0–36.0)
MCHC: 34.6 g/dL (ref 32.0–36.0)
MCV: 98.2 fL (ref 80.0–100.0)
MCV: 96.5 fL (ref 80.0–100.0)
MCV: 96.9 fL (ref 80.0–100.0)
MCV: 96.8 fL (ref 80.0–100.0)
PLATELETS: 130 10*3/uL — AB (ref 150–440)
PLATELETS: 129 10*3/uL — AB (ref 150–440)
Platelets: 139 10*3/uL — ABNORMAL LOW (ref 150–440)
Platelets: 135 10*3/uL — ABNORMAL LOW (ref 150–440)
RBC: 2.68 MIL/uL — AB (ref 3.80–5.20)
RBC: 2.66 MIL/uL — AB (ref 3.80–5.20)
RBC: 2.7 MIL/uL — AB (ref 3.80–5.20)
RBC: 2.65 MIL/uL — AB (ref 3.80–5.20)
RDW: 14.1 % (ref 11.5–14.5)
RDW: 13.9 % (ref 11.5–14.5)
RDW: 14.4 % (ref 11.5–14.5)
RDW: 14.3 % (ref 11.5–14.5)
WBC: 19.8 10*3/uL — AB (ref 3.6–11.0)
WBC: 17.4 10*3/uL — AB (ref 3.6–11.0)
WBC: 16 10*3/uL — AB (ref 3.6–11.0)
WBC: 14.8 10*3/uL — AB (ref 3.6–11.0)

## 2016-06-06 LAB — HEMOGLOBIN: Hemoglobin: 10 g/dL — ABNORMAL LOW (ref 12.0–16.0)

## 2016-06-06 LAB — BASIC METABOLIC PANEL
Anion gap: 6 (ref 5–15)
BUN: 42 mg/dL — ABNORMAL HIGH (ref 6–20)
CHLORIDE: 105 mmol/L (ref 101–111)
CO2: 28 mmol/L (ref 22–32)
CREATININE: 1.13 mg/dL — AB (ref 0.44–1.00)
Calcium: 8.1 mg/dL — ABNORMAL LOW (ref 8.9–10.3)
GFR, EST AFRICAN AMERICAN: 47 mL/min — AB (ref >60)
GFR, EST NON AFRICAN AMERICAN: 40 mL/min — AB (ref >60)
Glucose, Bld: 94 mg/dL (ref 65–99)
Potassium: 3.5 mmol/L (ref 3.5–5.1)
SODIUM: 139 mmol/L (ref 135–145)

## 2016-06-06 NOTE — Consult Note (Signed)
Hgb stable, no further bleeding, finger color looks good.  Likely UGI tract bleed, duodenal or gastric ulcer or AVM are most likely causes.  Will hold on EGD for now and see how she does overnight.

## 2016-06-06 NOTE — Consult Note (Signed)
Given her hx of vomiting blood and elevated BUN level this is clearly an UGI bleed situation with her passing old blood per rectum.  Her BP is stable, fingers and conjunctiva are not very pale.  I spoke with son who is on vacation but coming home and with grandson who is in room visiting.  I will repeat hgb in a few hours and decide on EGD today or not. or not.  She will be on ice chips and sips for now.  Full consult to follow.

## 2016-06-06 NOTE — Clinical Social Work Note (Addendum)
CSW received consult that patient wanted to discuss ALF. Patient's son indicated that she is currently at an independent living facility via [facility], and his family plans to assess any changes in care level once they see her progress in the hospital. CSW will con't to follow.  Argentina PonderKaren Martha Offie Waide, MSW LCSW-A [phone]

## 2016-06-06 NOTE — Consult Note (Signed)
Patient asleep, fingers still pink.  Hgb up to 10.  Will check on her in morning.  Hold EGD for now.

## 2016-06-06 NOTE — Progress Notes (Signed)
[employer] - [place] at [facility]   PATIENT NAME: Kelly Booth    MR#:  [id number]  DATE OF BIRTH:  19-Aug-1920  SUBJECTIVE:  CHIEF COMPLAINT:   Chief Complaint  Patient presents with  . Weakness   No complaints except for weakness, no bloody stool or melena after admission. REVIEW OF SYSTEMS:  Review of Systems  Constitutional: Positive for malaise/fatigue. Negative for chills and fever.  HENT: Negative for congestion and sore throat.   Eyes: Negative for blurred vision and double vision.  Respiratory: Negative for cough, sputum production, shortness of breath and wheezing.   Cardiovascular: Negative for chest pain, palpitations and leg swelling.  Gastrointestinal: Negative for abdominal pain, blood in stool, diarrhea, melena, nausea and vomiting.  Genitourinary: Negative for dysuria and urgency.  Musculoskeletal: Negative for joint pain.  Skin: Negative for itching and rash.  Neurological: Positive for weakness. Negative for dizziness, focal weakness, loss of consciousness and headaches.    DRUG ALLERGIES:  No Known Allergies VITALS:  Blood pressure (!) 105/42, pulse 83, temperature 97.5 F (36.4 C), temperature source Oral, resp. rate 14, height 5\' 3"  (1.6 m), weight 103 lb (46.7 kg), SpO2 97 %. PHYSICAL EXAMINATION:  Physical Exam  Constitutional: She is oriented to person, place, and time and well-developed, well-nourished, and in no distress. No distress.  HENT:  Head: Normocephalic.  Mouth/Throat: Oropharynx is clear and moist.  Eyes: Conjunctivae and EOM are normal. No scleral icterus.  Neck: Normal range of motion. Neck supple. No JVD present. No tracheal deviation present.  Cardiovascular: Normal rate and regular rhythm.  Exam reveals no gallop.   Murmur heard. 4/6 systolic murmur  Pulmonary/Chest: Effort normal and breath sounds normal. No respiratory distress. She has no wheezes. She has no rales.  Abdominal: Soft. Bowel sounds are normal.  She exhibits no distension. There is no tenderness.  Musculoskeletal: Normal range of motion. She exhibits no edema or tenderness.  Lymphadenopathy:    She has no cervical adenopathy.  Neurological: She is alert and oriented to person, place, and time. No cranial nerve deficit.  Skin: Skin is warm. No rash noted. No erythema.  Psychiatric: Affect normal.   LABORATORY PANEL:   CBC  Recent Labs Lab 06/06/16 1301  WBC 14.8*  HGB 8.9*  HCT 25.7*  PLT 129*   ------------------------------------------------------------------------------------------------------------------ Chemistries   Recent Labs Lab 06/05/16 1821 06/06/16 0337  NA 138 139  K 3.7 3.5  CL 99* 105  CO2 31 28  GLUCOSE 101* 94  BUN 41* 42*  CREATININE 1.28* 1.13*  CALCIUM 9.4 8.1*  MG 1.9  --    RADIOLOGY:  Ct Abdomen Pelvis Wo Contrast  Result Date: 06/05/2016 CLINICAL DATA:  Generalized weakness, unwitnessed fall today with decreased appetite, diarrhea with blood in stool all night, history hypertension, pulmonary fibrosis, esophageal reflux, atrial fibrillation EXAM: CT ABDOMEN AND PELVIS WITHOUT CONTRAST TECHNIQUE: Multidetector CT imaging of the abdomen and pelvis was performed following the standard protocol without IV contrast. Oral contrast was not administered. Sagittal and coronal MPR images reconstructed from axial data set. Examinations dated limited by the lack of contrast and the lack inadequate intra-abdominal tissue planes as well as beam hardening artifacts in the pelvis. COMPARISON:  None FINDINGS: Lower chest:  LEFT basilar atelectasis. Hepatobiliary: Low-attenuation lesion in LEFT lobe liver 8 mm diameter image 8 question small cyst. Remainder of liver normal appearance. Gallbladder unremarkable. Pancreas: Suboptimally visualized due to lack of contrast, no gross abnormality seen. Spleen: Normal morphology Adrenals/Urinary Tract:  Adrenal glands poorly visualized. Distal ureters and bladder are  obscured by beam hardening artifacts. LEFT kidney normal morphology without mass or hydronephrosis. RIGHT hydronephrosis with effacement of renal sinus fat of uncertain etiology ; the proximal portion of the RIGHT ureter appears slightly dilated but the mid to distal portions are not visualized. Tiny nonobstructing calculi at inferior poles of both kidneys. Stomach/Bowel: Appendix not visualized. Stomach decompressed, cannot exclude wall thickening. Small hiatal hernia. Pelvic bowel loops obscured by beam hardening artifacts. Visualized upper and mid abdominal bowel loops grossly unremarkable. Vascular/Lymphatic: Extensive atherosclerotic calcifications without aneurysm Dense mitral annular calcification.  No gross adenopathy. Reproductive: Unable to assess due to beam hardening artifacts Other: No gross evidence of free air or free fluid. No definite hernia. Musculoskeletal: BILATERAL hip prostheses with extensive beam hardening artifacts in pelvis. Diffuse osseous demineralization. Old LEFT rib fractures. Old RIGHT inferior pubic ramus fracture. IMPRESSION: Tiny BILATERAL nonobstructing renal calculi. Additionally, RIGHT hydronephrosis and proximal hydroureter though cause is uncertain, with mid and distal portions of the RIGHT ureter inadequately visualized. Small hiatal hernia. Question tiny hepatic cyst. Aortic atherosclerosis. Electronically Signed   By: Ulyses SouthwardMark  Boles M.D.   On: 06/05/2016 19:38   ASSESSMENT AND PLAN:   This is a 82 y.o. female with a history of atrial fibrillation, hypertension, urinary fibrosis now being admitted with: 1. GI bleeding with anemia. Hb decreased to 8.9. Continue IV Protonix drip, follow-up Serial CBC. -Nothing by mouth, Hold aspirin. Dr. Chapman MossElliot,GI consult, suggested follow-up hemoglobin and watch for bleeding, may need EGD this afternoon.  2. Leukocytosis-source unclear possibly related to gastroenteritis given patient's recent symptoms. She also has not been on any  steroids recently.  F/u Stool studies including culture, white blood cells, ova and parasites, C. Difficile.  3. AKI possibly secondary to right hydronephrosis with hydroureter -IV fluid hydration Follow-up Consult urology.  4. Atrial fibrillation, rate controlled-continue home medications 5. Hypertension, hold amlodipine, Lasix, Imdur due to low blood pressure 6. Pulmonary fibrosis-continue Combivent 7. Osteoporosis-continue vitamin D and calcium 8. Iron deficiency-continue ferrous sulfate 9. Glaucoma-continue eye drops and artificial tears 10. GERD-IV Protonix drip per #1  Discussed with Dr. Markham JordanElliot. All the records are reviewed and case discussed with Care Management/Social Worker. Management plans discussed with the patient, Her grandson and they are in agreement.  CODE STATUS: DO NOT RESUSCITATE  TOTAL TIME TAKING CARE OF THIS PATIENT: 39 minutes.   More than 50% of the time was spent in counseling/coordination of care: YES  POSSIBLE D/C IN 2 DAYS, DEPENDING ON CLINICAL CONDITION.   Shaune Pollackhen, Vertis Scheib M.D on 06/06/2016 at 1:21 PM  Between 7am to 6pm - Pager - [phone]  After 6pm go to [email or web address] - Scientist, research (life sciences)password [email or web address]  [facility] [place] [facility]  Office  [phone]  CC: Primary care physician; No primary care provider on file.  Note: This dictation was prepared with Dragon dictation along with smaller phrase technology. Any transcriptional errors that result from this process are unintentional.

## 2016-06-06 NOTE — Consult Note (Signed)
GI Inpatient Consult Note  Reason for Consult:GI bleeding, acut blood loss anemia.   Attending Requesting Consult:Chen  History of Present Illness: Kelly Booth is a 82 y.o. female with hx of vomiting reddish blood and passing blood per rectum all started yesterday, no prior similar event.    Past Medical History:  Past Medical History:  Diagnosis Date  . A-fib (HCC)   . Allergic rhinitis   . Constipation   . Dyspepsia   . Esophageal reflux   . Generalized weakness   . Glaucoma   . Hypertension   . Osteoporosis   . Pulmonary fibrosis (HCC)     Problem List: Patient Active Problem List   Diagnosis Date Noted  . GI bleed 06/05/2016  . Malnutrition of moderate degree 12/06/2015  . Bradycardia 12/05/2015    Past Surgical History: Past Surgical History:  Procedure Laterality Date  . ABDOMINAL HYSTERECTOMY    . TOTAL HIP ARTHROPLASTY      Allergies: No Known Allergies  Home Medications: Prescriptions Prior to Admission  Medication Sig Dispense Refill Last Dose  . aspirin EC 81 MG tablet Take 1 tablet by mouth daily.   12/04/2015 at 0830  . bisacodyl (STIMULANT LAXATIVE) 5 MG EC tablet Take 2 tablets by mouth daily.   12/04/2015 at Unknown time  . Calcium Carbonate-Vitamin D (CALCIUM-VITAMIN D) 500-200 MG-UNIT tablet Take 1 tablet by mouth 2 (two) times daily.   12/04/2015 at Unknown time  . Cholecalciferol (VITAMIN D3) 1000 units CAPS Take 1 capsule by mouth daily.   12/04/2015 at Unknown time  . ferrous sulfate 325 (65 FE) MG tablet Take 1 tablet by mouth daily.   12/04/2015 at Unknown time  . furosemide (LASIX) 20 MG tablet Take 1 tablet by mouth daily.   12/04/2015 at Unknown time  . Ipratropium-Albuterol (COMBIVENT) 20-100 MCG/ACT AERS respimat Inhale 1 puff into the lungs 4 (four) times daily.   12/04/2015 at Unknown time  . isosorbide mononitrate (IMDUR) 30 MG 24 hr tablet Take 1 tablet by mouth daily.   12/04/2015 at Unknown time  . latanoprost (XALATAN) 0.005 % ophthalmic  solution Apply 1 drop to eye at bedtime.   12/04/2015 at Unknown time  . polyvinyl alcohol (ARTIFICIAL TEARS) 1.4 % ophthalmic solution Apply 1 drop to eye 4 (four) times daily as needed.   PRN  . ranitidine (ZANTAC) 150 MG tablet Take 1 tablet by mouth 2 (two) times daily.   12/04/2015 at Unknown time  . amLODipine (NORVASC) 5 MG tablet Take 1 tablet by mouth daily.   Not Taking at Unknown time   Home medication reconciliation was completed with the patient.   Scheduled Inpatient Medications:   . amLODipine  5 mg Oral Daily  . bisacodyl  10 mg Oral Daily  . calcium-vitamin D  1 tablet Oral BID  . cholecalciferol  1,000 Units Oral Daily  . enoxaparin (LOVENOX) injection  30 mg Subcutaneous QHS  . ferrous sulfate  325 mg Oral Daily  . furosemide  40 mg Oral Daily  . ipratropium-albuterol  3 mL Inhalation QID  . isosorbide mononitrate  30 mg Oral Daily  . latanoprost  1 drop Both Eyes QHS  . sodium chloride flush  3 mL Intravenous Q12H    Continuous Inpatient Infusions:   . sodium chloride 75 mL/hr at 06/05/16 2300  . pantoprozole (PROTONIX) infusion 8 mg/hr (06/06/16 0853)    PRN Inpatient Medications:  acetaminophen **OR** acetaminophen, bisacodyl, HYDROcodone-acetaminophen, magnesium citrate, ondansetron **OR** ondansetron (ZOFRAN) IV, polyvinyl  alcohol, senna-docusate, zolpidem  Family History: family history includes Hypertension in her other.  The patient's family history is negative for inflammatory bowel disorders, GI malignancy, or solid organ transplantation.  Social History:   reports that she has never smoked. She has never used smokeless tobacco. She reports that she does not drink alcohol or use drugs. The patient denies ETOH, tobacco, or drug use.   Review of Systems: Constitutional: Weight is stable.  Eyes: No changes in vision. ENT: No oral lesions, sore throat.  GI: see HPI.  Heme/Lymph: No easy bruising.  CV: No chest pain.  GU: No hematuria.  Integumentary:  No rashes.  Neuro: No headaches.  Psych: No depression/anxiety.  Endocrine: No heat/cold intolerance.  Allergic/Immunologic: No urticaria.  Resp: No cough, SOB.  Musculoskeletal: No joint swelling.    Physical Examination: BP (!) 105/42 (BP Location: Left Arm)   Pulse 83   Temp 97.5 F (36.4 C) (Oral)   Resp 14   Ht 5\' 3"  (1.6 m)   Wt 46.7 kg (103 lb)   SpO2 100%   BMI 18.25 kg/m  Gen: Thin elderly WF in NAD HEENT: [place], EOMI, tongue pinkish. Neck: supple, no JVD or thyromegaly Chest: CTA bilaterally, no wheezes, crackles, or other adventitious sounds CV: RRR, harsh 2-3/6 systolic murmur. Abd: soft, NT, ND, +BS in all four quadrants; no HSM, guarding, ridigity, or rebound tenderness Ext: no edema, well perfused with 2+ pulses, Skin: no rash or lesions noted, mild pale fingers Lymph: no LAD  Data: Lab Results  Component Value Date   WBC 17.4 (H) 06/06/2016   HGB 8.8 (L) 06/06/2016   HCT 25.7 (L) 06/06/2016   MCV 96.5 06/06/2016   PLT 139 (L) 06/06/2016    Recent Labs Lab 06/05/16 1821 06/06/16 0337 06/06/16 0657  HGB 10.9* 9.0* 8.8*   Lab Results  Component Value Date   NA 139 06/06/2016   K 3.5 06/06/2016   CL 105 06/06/2016   CO2 28 06/06/2016   BUN 42 (H) 06/06/2016   CREATININE 1.13 (H) 06/06/2016   Lab Results  Component Value Date   ALT 16 12/24/2015   AST 25 12/24/2015   ALKPHOS 58 12/24/2015   BILITOT 1.1 12/24/2015   No results for input(s): APTT, INR, PTT in the last 168 hours. Assessment/Plan: Kelly Booth is a 82 y.o. female with likely an UGI bleed given her vomiting blood and elevated BUN: creatinine levels.  Will follow for now and repeat hgb in a few hours.May do EGD today if further bleeding appears. I have spoken to patient, son and grandson about plans.  Recommendations:  Thank you for the consult. Please call with questions or concerns.  Lynnae PrudeELLIOTT, Lorin Hauck, MD

## 2016-06-06 NOTE — Plan of Care (Signed)
Problem: Education: Goal: Knowledge of [facility] Education information/materials will improve Outcome: Progressing Pt care reviewed with grandson Chrissie Noa(william), pt status DNR per family. Order changed in Epic to reflect code status. Consult GI and urology in AM.  Problem: Safety: Goal: Ability to remain free from injury will improve Outcome: Progressing High risk for falls, pt [facility]. Call bell within reach, bed alarm set. Pt to remain free from falls.  Problem: Pain Managment: Goal: General experience of comfort will improve Outcome: Progressing Pt denies pain, will cont to monitor.   Problem: Physical Regulation: Goal: Ability to maintain clinical measurements within normal limits will improve Outcome: Not Progressing Pt uses walker at home, PT/OT consulted.  Goal: Will remain free from infection Outcome: Progressing Monitor WBC, pt afebrile.   Problem: Skin Integrity: Goal: Risk for impaired skin integrity will decrease Outcome: Progressing Turn pt q2hours, foam applied to sacrum area per protocol.   Problem: Tissue Perfusion: Goal: Risk factors for ineffective tissue perfusion will decrease Outcome: Progressing Pt with bil LE edema, lasix ordered per md order.   Problem: Activity: Goal: Risk for activity intolerance will decrease Outcome: Not Progressing Pt unable to stand at current moment, PT ordered.   Problem: Fluid Volume: Goal: Ability to maintain a balanced intake and output will improve Outcome: Progressing MIVF per MD order, npo.  Problem: Nutrition: Goal: Adequate nutrition will be maintained Outcome: Not Progressing NPO status, consult GI for possible GIB.  Problem: Bowel/Gastric: Goal: Will not experience complications related to bowel motility Outcome: Progressing Family stated pt was admitted with diarrhea, no BM currently.

## 2016-06-06 NOTE — Consult Note (Signed)
Urology Consult  Referring physician: Harvie Bridge, DO ([place]) Reason for referral: Right Hydronephrosis  Chief Complaint: Acute weakness bloody diarrhea and hematemesis  History of Present Illness: 82 y.o. Female with A-fib, HTN, Pulmonary Fibrosis admitted 06/05/2016 with GI bleed.  CT Abd/Pelvis w/out Contrast (AKI with Cr 1.28, baseline Cr 1.05, 12/24/2015, but had been 1.27 08/21/2014) with mild right hydroureteronephrosis (mid and distal ureter not visualized due to beam-hardening artifact due to b/L hip prostheses) and tiny b/L lower pole renal calculi.  Patient has remained hemodynamically stable since admission without recurrence of blood per rectum nor hematemesis. Hct 30.7 in the ER with WBC 19.3k (6:21pm) with decrease in Hct to 26.4 at 3:34am after IV hydration and stable, thereafter, with serial determinations q3-4 hrs (25.7 at 1:01pm today).  WBC improving to 14.8 (1:01pm).  Cr improving (1.13 at 3:37).  Pt endorses a one week h/o change in bowel habits with smaller (decreased caliber), softer stools.  Pt reports an acute urge to use the bathroom (cannot recall if it was an urge to void or defecate) yesterday with the acute passage of a large amount of congealed bright red blood.  Pt does not recall bloody diarrhea, however, her grandson, a local EMT, reports that the pt informed him that she had been up all night with frequent bloody BM's, prompting presentation to the [facility] ER.  Pt denies any weight loss, gross hematuria, urinary incontinence, h/o recurrent UTI's, h/o stones.  Past Medical History:  Diagnosis Date  . A-fib ([place])   . Allergic rhinitis   . Constipation   . Dyspepsia   . Esophageal reflux   . Generalized weakness   . Glaucoma   . Hypertension   . Osteoporosis   . Pulmonary fibrosis ([place])    Past Surgical History:  Procedure Laterality Date  . ABDOMINAL HYSTERECTOMY    . TOTAL HIP ARTHROPLASTY      Medications:  I have reviewed the patient's  current medications. Prior to Admission:  Prescriptions Prior to Admission  Medication Sig Dispense Refill Last Dose  . aspirin EC 81 MG tablet Take 1 tablet by mouth daily.   12/04/2015 at 0830  . bisacodyl (STIMULANT LAXATIVE) 5 MG EC tablet Take 2 tablets by mouth daily.   12/04/2015 at Unknown time  . Calcium Carbonate-Vitamin D (CALCIUM-VITAMIN D) 500-200 MG-UNIT tablet Take 1 tablet by mouth 2 (two) times daily.   12/04/2015 at Unknown time  . Cholecalciferol (VITAMIN D3) 1000 units CAPS Take 1 capsule by mouth daily.   12/04/2015 at Unknown time  . ferrous sulfate 325 (65 FE) MG tablet Take 1 tablet by mouth daily.   12/04/2015 at Unknown time  . furosemide (LASIX) 20 MG tablet Take 1 tablet by mouth daily.   12/04/2015 at Unknown time  . Ipratropium-Albuterol (COMBIVENT) 20-100 MCG/ACT AERS respimat Inhale 1 puff into the lungs 4 (four) times daily.   12/04/2015 at Unknown time  . isosorbide mononitrate (IMDUR) 30 MG 24 hr tablet Take 1 tablet by mouth daily.   12/04/2015 at Unknown time  . latanoprost (XALATAN) 0.005 % ophthalmic solution Apply 1 drop to eye at bedtime.   12/04/2015 at Unknown time  . polyvinyl alcohol (ARTIFICIAL TEARS) 1.4 % ophthalmic solution Apply 1 drop to eye 4 (four) times daily as needed.   PRN  . ranitidine (ZANTAC) 150 MG tablet Take 1 tablet by mouth 2 (two) times daily.   12/04/2015 at Unknown time  . amLODipine (NORVASC) 5 MG tablet Take 1 tablet by  mouth daily.   Not Taking at Unknown time   Scheduled: . bisacodyl  10 mg Oral Daily  . calcium-vitamin D  1 tablet Oral BID  . cholecalciferol  1,000 Units Oral Daily  . ferrous sulfate  325 mg Oral Daily  . ipratropium-albuterol  3 mL Inhalation QID  . latanoprost  1 drop Both Eyes QHS  . sodium chloride flush  3 mL Intravenous Q12H   Continuous: . sodium chloride 75 mL/hr at 06/06/16 1249  . pantoprozole (PROTONIX) infusion 8 mg/hr (06/06/16 0853)   EHU:[id number] **OR** acetaminophen, bisacodyl,  HYDROcodone-acetaminophen, magnesium citrate, ondansetron **OR** ondansetron (ZOFRAN) IV, polyvinyl alcohol, senna-docusate, zolpidem Allergies: No Known Allergies  Family History  Problem Relation Age of Onset  . Hypertension Other    Social History:  reports that she has never smoked. She has never used smokeless tobacco. She reports that she does not drink alcohol or use drugs.  ROS  Physical Exam:  Vital signs in last 24 hours: Temp:  [97.5 F (36.4 C)-98.5 F (36.9 C)] 98.1 F (36.7 C) (09/02 1511) Pulse Rate:  [78-95] 78 (09/02 1511) Resp:  [14-30] 14 (09/02 0436) BP: (105-180)/(39-79) 122/39 (09/02 1511) SpO2:  [93 %-100 %] 99 % (09/02 1511) Weight:  [45.4 kg (100 lb)-46.7 kg (103 lb)] 46.7 kg (103 lb) (09/01 2308) Physical Exam  Gen: WD, thin, frail-appearing WF in NAD Skin/Lymphatics: Warm/Dry without lesions about the Head/Neck; no cervical/supraclavicular adenopathy HEENT: [place]/AT, anicteric, no masses/bruits Chest: CTA with nL respiratory effort Cor: distant heart sounds; 1+ Carotid/Radial pulses b/L ABD: NT/ND, no CVAT, no palpable masses, hypoactive bowel sounds Pelvic: marked vaginal atrophy with introital stenosis - no gross blood at the introitus; no palpable vaginal masses - NT DRE: NST, empty rectal vault (no masses, no stool) with scant blood-tinged mucus Ext: 1+ pitting edema b/L LE with brawny discoloration/tree-barking Neuro: non-focal Psych: A&O x4, pleasant/cooperative  Laboratory Data:  Results for orders placed or performed during the hospital encounter of 06/05/16 (from the past 72 hour(s))  Basic metabolic panel     Status: Abnormal   Collection Time: 06/05/16  6:21 PM  Result Value Ref Range   Sodium 138 135 - 145 mmol/L   Potassium 3.7 3.5 - 5.1 mmol/L   Chloride 99 (L) 101 - 111 mmol/L   CO2 31 22 - 32 mmol/L   Glucose, Bld 101 (H) 65 - 99 mg/dL   BUN 41 (H) 6 - 20 mg/dL   Creatinine, Ser 1.28 (H) 0.44 - 1.00 mg/dL   Calcium 9.4 8.9 - 10.3  mg/dL   GFR calc non Af Amer 35 (L) >60 mL/min   GFR calc Af Amer 40 (L) >60 mL/min    Comment: (NOTE) The eGFR has been calculated using the CKD EPI equation. This calculation has not been validated in all clinical situations. eGFR's persistently <60 mL/min signify possible Chronic Kidney Disease.    Anion gap 8 5 - 15  CBC     Status: Abnormal   Collection Time: 06/05/16  6:21 PM  Result Value Ref Range   WBC 19.3 (H) 3.6 - 11.0 K/uL   RBC 3.18 (L) 3.80 - 5.20 MIL/uL   Hemoglobin 10.9 (L) 12.0 - 16.0 g/dL   HCT 30.7 (L) 35.0 - 47.0 %   MCV 96.4 80.0 - 100.0 fL   MCH 34.2 (H) 26.0 - 34.0 pg   MCHC 35.5 32.0 - 36.0 g/dL   RDW 14.1 11.5 - 14.5 %   Platelets 186 150 - 440 K/uL  Magnesium     Status: None   Collection Time: 06/05/16  6:21 PM  Result Value Ref Range   Magnesium 1.9 1.7 - 2.4 mg/dL  Phosphorus     Status: None   Collection Time: 06/05/16  6:21 PM  Result Value Ref Range   Phosphorus 3.1 2.5 - 4.6 mg/dL  Basic metabolic panel     Status: Abnormal   Collection Time: 06/06/16  3:37 AM  Result Value Ref Range   Sodium 139 135 - 145 mmol/L   Potassium 3.5 3.5 - 5.1 mmol/L   Chloride 105 101 - 111 mmol/L   CO2 28 22 - 32 mmol/L   Glucose, Bld 94 65 - 99 mg/dL   BUN 42 (H) 6 - 20 mg/dL   Creatinine, Ser 1.13 (H) 0.44 - 1.00 mg/dL   Calcium 8.1 (L) 8.9 - 10.3 mg/dL   GFR calc non Af Amer 40 (L) >60 mL/min   GFR calc Af Amer 47 (L) >60 mL/min    Comment: (NOTE) The eGFR has been calculated using the CKD EPI equation. This calculation has not been validated in all clinical situations. eGFR's persistently <60 mL/min signify possible Chronic Kidney Disease.    Anion gap 6 5 - 15  CBC     Status: Abnormal   Collection Time: 06/06/16  3:37 AM  Result Value Ref Range   WBC 19.8 (H) 3.6 - 11.0 K/uL   RBC 2.68 (L) 3.80 - 5.20 MIL/uL   Hemoglobin 9.0 (L) 12.0 - 16.0 g/dL   HCT 26.4 (L) 35.0 - 47.0 %   MCV 98.2 80.0 - 100.0 fL   MCH 33.4 26.0 - 34.0 pg   MCHC 34.0  32.0 - 36.0 g/dL   RDW 14.1 11.5 - 14.5 %   Platelets 130 (L) 150 - 440 K/uL  CBC     Status: Abnormal   Collection Time: 06/06/16  6:57 AM  Result Value Ref Range   WBC 17.4 (H) 3.6 - 11.0 K/uL   RBC 2.66 (L) 3.80 - 5.20 MIL/uL   Hemoglobin 8.8 (L) 12.0 - 16.0 g/dL   HCT 25.7 (L) 35.0 - 47.0 %   MCV 96.5 80.0 - 100.0 fL   MCH 33.0 26.0 - 34.0 pg   MCHC 34.2 32.0 - 36.0 g/dL   RDW 13.9 11.5 - 14.5 %   Platelets 139 (L) 150 - 440 K/uL  CBC     Status: Abnormal   Collection Time: 06/06/16 10:52 AM  Result Value Ref Range   WBC 16.0 (H) 3.6 - 11.0 K/uL   RBC 2.70 (L) 3.80 - 5.20 MIL/uL   Hemoglobin 9.0 (L) 12.0 - 16.0 g/dL   HCT 26.1 (L) 35.0 - 47.0 %   MCV 96.9 80.0 - 100.0 fL   MCH 33.3 26.0 - 34.0 pg   MCHC 34.4 32.0 - 36.0 g/dL   RDW 14.4 11.5 - 14.5 %   Platelets 135 (L) 150 - 440 K/uL  CBC     Status: Abnormal   Collection Time: 06/06/16  1:01 PM  Result Value Ref Range   WBC 14.8 (H) 3.6 - 11.0 K/uL   RBC 2.65 (L) 3.80 - 5.20 MIL/uL   Hemoglobin 8.9 (L) 12.0 - 16.0 g/dL   HCT 25.7 (L) 35.0 - 47.0 %   MCV 96.8 80.0 - 100.0 fL   MCH 33.5 26.0 - 34.0 pg   MCHC 34.6 32.0 - 36.0 g/dL   RDW 14.3 11.5 - 14.5 %   Platelets 129 (  L) 150 - 440 K/uL   No results found for this or any previous visit (from the past 240 hour(s)). Creatinine:  Recent Labs  06/05/16 1821 06/06/16 0337  CREATININE 1.28* 1.13*   Baseline Creatinine: 1.05 (12/24/2015) -1.27 (08/21/2014)  CT Abd/Pelvis w/out Contrast (06/05/2016) - images personally reviewed -IMPRESSION: 1. Tiny BILATERAL nonobstructing renal calculi. 2. Additionally, RIGHT hydronephrosis and proximal hydroureter though cause is uncertain, with mid and distal portions of the RIGHT ureter inadequately visualized. 3. Small hiatal hernia. 4. Question tiny hepatic cyst. 5. Aortic atherosclerosis.  Electronically Signed   By: Lavonia Dana M.D.   On: 06/05/2016 19:38  Impression/Assessment:   1. Right Hydroureteronephrosis  (mild) - asymptomatic, etiology uncertain (pelvis obscured on CT due to marked beam hardening artifact from b/L hip prostheses).  Differential diagnoses include ureteral calculus, ureteral/UVJ mass (d/w pt and pt's son). -no indication for acute urologic intervention  2. AKI/CRI - improving with IV hydration  3. Hematochezia/Hematemesis - hemodynamically stable without recurrent episodes. Being followed closely by GI Medicine.  Recommendations:   1. Elective outpatient evaluation with Cystoscopy, Right R[facility]) 2. Consider urgent ureteral stent placement if the pt becomes symptomatic (right flank/abd pain, F/C, worsening leukocytosis with left shift)  Please reconsult, if needed.  Darcella Cheshire 06/06/2016, 4:16 PM

## 2016-06-07 ENCOUNTER — Encounter: Payer: Self-pay | Admitting: Pulmonary Disease

## 2016-06-07 ENCOUNTER — Inpatient Hospital Stay (HOSPITAL_COMMUNITY)
Admit: 2016-06-07 | Discharge: 2016-06-07 | Disposition: A | Discharge status: Home / Self Care | Payer: Medicare Other | Attending: Pulmonary Disease | Admitting: Pulmonary Disease

## 2016-06-07 ENCOUNTER — Inpatient Hospital Stay: Payer: Medicare Other

## 2016-06-07 DIAGNOSIS — J9601 Acute respiratory failure with hypoxia: Secondary | ICD-10-CM

## 2016-06-07 DIAGNOSIS — R011 Cardiac murmur, unspecified: Secondary | ICD-10-CM

## 2016-06-07 DIAGNOSIS — J810 Acute pulmonary edema: Secondary | ICD-10-CM

## 2016-06-07 DIAGNOSIS — R0600 Dyspnea, unspecified: Secondary | ICD-10-CM

## 2016-06-07 LAB — BLOOD GAS, ARTERIAL
ACID-BASE DEFICIT: 2 mmol/L (ref 0.0–2.0)
Allens test (pass/fail): POSITIVE — AB
Bicarbonate: 25.4 mmol/L (ref 20.0–28.0)
FIO2: 0.55
O2 SAT: 86.4 %
PH ART: 7.28 — AB (ref 7.350–7.450)
Patient temperature: 37
pCO2 arterial: 54 mmHg — ABNORMAL HIGH (ref 32.0–48.0)
pO2, Arterial: 59 mmHg — ABNORMAL LOW (ref 83.0–108.0)

## 2016-06-07 LAB — ECHOCARDIOGRAM COMPLETE
Height: 63 in
WEIGHTICAEL: 1648 [oz_av]

## 2016-06-07 LAB — URINALYSIS COMPLETE WITH MICROSCOPIC (ARMC ONLY)
Bilirubin Urine: NEGATIVE
Glucose, UA: NEGATIVE mg/dL
NITRITE: NEGATIVE
PROTEIN: 100 mg/dL — AB
Specific Gravity, Urine: 1.014 (ref 1.005–1.030)
pH: 6 (ref 5.0–8.0)

## 2016-06-07 LAB — GLUCOSE, CAPILLARY: GLUCOSE-CAPILLARY: 70 mg/dL (ref 65–99)

## 2016-06-07 LAB — CBC
HEMATOCRIT: 28.6 % — AB (ref 35.0–47.0)
HEMOGLOBIN: 9.8 g/dL — AB (ref 12.0–16.0)
MCH: 34 pg (ref 26.0–34.0)
MCHC: 34.4 g/dL (ref 32.0–36.0)
MCV: 98.6 fL (ref 80.0–100.0)
Platelets: 139 10*3/uL — ABNORMAL LOW (ref 150–440)
RBC: 2.9 MIL/uL — AB (ref 3.80–5.20)
RDW: 14.2 % (ref 11.5–14.5)
WBC: 12.6 10*3/uL — ABNORMAL HIGH (ref 3.6–11.0)

## 2016-06-07 LAB — BRAIN NATRIURETIC PEPTIDE: B NATRIURETIC PEPTIDE 5: 1439 pg/mL — AB (ref 0.0–100.0)

## 2016-06-07 LAB — TROPONIN I: Troponin I: 0.83 ng/mL (ref <0.03)

## 2016-06-07 LAB — MRSA PCR SCREENING: MRSA BY PCR: NEGATIVE

## 2016-06-07 MED ORDER — AMLODIPINE BESYLATE 5 MG PO TABS
5.0000 mg | ORAL_TABLET | Freq: Every day | ORAL | Status: DC
  Administered 2016-06-07: 5 mg via ORAL
  Filled 2016-06-07: qty 1

## 2016-06-07 MED ORDER — IPRATROPIUM-ALBUTEROL 0.5-2.5 (3) MG/3ML IN SOLN: 3.0000 mL | RESPIRATORY_TRACT | Status: DC | PRN

## 2016-06-07 MED ORDER — ISOSORBIDE MONONITRATE ER 30 MG PO TB24
30.0000 mg | ORAL_TABLET | Freq: Every day | ORAL | Status: DC
  Administered 2016-06-08 – 2016-06-11 (×4): 30 mg via ORAL
  Filled 2016-06-07 (×5): qty 1

## 2016-06-07 MED ORDER — FAMOTIDINE IN NACL 20-0.9 MG/50ML-% IV SOLN
20.0000 mg | Freq: Two times a day (BID) | INTRAVENOUS | Status: DC
  Administered 2016-06-07 (×2): 20 mg via INTRAVENOUS
  Filled 2016-06-07 (×2): qty 50

## 2016-06-07 MED ORDER — PANTOPRAZOLE SODIUM 40 MG PO TBEC: 40.0000 mg | DELAYED_RELEASE_TABLET | Freq: Two times a day (BID) | ORAL | Status: DC

## 2016-06-07 MED ORDER — DILTIAZEM HCL 25 MG/5ML IV SOLN
20.0000 mg | Freq: Once | INTRAVENOUS | Status: AC
  Administered 2016-06-07: 20 mg via INTRAVENOUS
  Filled 2016-06-07: qty 5

## 2016-06-07 MED ORDER — FUROSEMIDE 10 MG/ML IJ SOLN: 40.0000 mg | Freq: Once | INTRAMUSCULAR | Status: DC

## 2016-06-07 MED ORDER — DEXTROSE 5 % IV SOLN
1.0000 g | INTRAVENOUS | Status: AC
  Administered 2016-06-07 – 2016-06-09 (×3): 1 g via INTRAVENOUS
  Filled 2016-06-07 (×3): qty 10

## 2016-06-07 MED ORDER — FUROSEMIDE 10 MG/ML IJ SOLN
20.0000 mg | Freq: Once | INTRAMUSCULAR | Status: AC
  Administered 2016-06-07: 20 mg via INTRAVENOUS
  Filled 2016-06-07: qty 2

## 2016-06-07 MED ORDER — ORAL CARE MOUTH RINSE
15.0000 mL | Freq: Two times a day (BID) | OROMUCOSAL | Status: DC
  Administered 2016-06-07 – 2016-06-11 (×5): 15 mL via OROMUCOSAL

## 2016-06-07 MED ORDER — METOPROLOL TARTRATE 5 MG/5ML IV SOLN: 2.5000 mg | INTRAVENOUS | Status: DC | PRN

## 2016-06-07 MED ORDER — POTASSIUM CHLORIDE 10 MEQ/100ML IV SOLN
10.0000 meq | INTRAVENOUS | Status: AC
  Administered 2016-06-07 (×4): 10 meq via INTRAVENOUS
  Filled 2016-06-07 (×4): qty 100

## 2016-06-07 NOTE — Progress Notes (Signed)
Pt o2 sats on 5L [place] 84%, RT made aware and present pt changed to venti mask o2 sats 87% RR 32, Dr Imogene Burnhen made aware, per Dr Imogene Burnhen stat ABG, transfer pt to step down for bipap, Son Ron made aware and agrees with plan

## 2016-06-07 NOTE — Progress Notes (Addendum)
Patient oxygen saturation down to 87% on 2 liters. O2 increased to 4 liters and patient repositioned by self and 2nd RN. Oxygen saturation up to 91% on 4 liter, HR 104. No distress noted.

## 2016-06-07 NOTE — Consult Note (Signed)
PULMONARY CONSULT NOTE  Requesting MD/Service: Chen/Hospitalists Date of initial consultation: 06/07/16 Reason for consultation: Acute hypoxic respiratory failure  PT PROFILE: 73 F resident of assisted living facility admitted 06/05/16 with hematemesis and melena. Transferred to ICU 09/03 with respiratory distress and hypoxemia  HPI:  Pt is presently on NRB with mild distress. Therefore, most of the history is obtained from the hospital records. I also interviewed her son. She lives semi-independently prior to this admission. She has previously-defined advanced directives and clarified that she wishes to be DNR/DNI. She has been evaluated by GI medicine and any endoscopic evaluation has been deferred. Her H/H have remained stable since hospitalization. On the morning of 09/03, she developed increased dyspnea and hypoxemia prompting transfer to ICU. At the time of my evaluation, she denied pain and appeared comfortable on NRB mask. There has been no fever, CP or sputum production.   Past Medical History:  Diagnosis Date  . A-fib (HCC)   . Allergic rhinitis   . Constipation   . Dyspepsia   . Esophageal reflux   . Generalized weakness   . Glaucoma   . Hypertension   . Osteoporosis   . Pulmonary fibrosis (HCC)    There is no evidence of pulmonary fibrosis on CT chest    Past Surgical History:  Procedure Laterality Date  . ABDOMINAL HYSTERECTOMY    . TOTAL HIP ARTHROPLASTY      MEDICATIONS: I have reviewed all medications and confirmed regimen as documented  Social History   Social History  . Marital status: Widowed    Spouse name: N/A  . Number of children: N/A  . Years of education: N/A   Occupational History  . Not on file.   Social History Main Topics  . Smoking status: Never Smoker  . Smokeless tobacco: Never Used  . Alcohol use No  . Drug use: No  . Sexual activity: Not Currently    Birth control/ protection: None   Other Topics Concern  . Not on file    Social History Narrative  . No narrative on file    Family History  Problem Relation Age of Onset  . Hypertension Other     ROS: No fever, myalgias/arthralgias, unexplained weight loss or weight gain No new focal weakness or sensory deficits No otalgia, hearing loss, visual changes, nasal and sinus symptoms, mouth and throat problems No neck pain or adenopathy No abdominal pain, N/V/D, diarrhea, change in bowel pattern No dysuria, change in urinary pattern   Vitals:   06/07/16 0802 06/07/16 0833 06/07/16 0859 06/07/16 0900  BP:   (!) 155/65 (!) 165/70  Pulse:   (!) 102 (!) 102  Resp:   (!) 34 (!) 25  Temp:   99.5 F (37.5 C)   TempSrc:   Axillary   SpO2: 91% 95% 92% 91%  Weight:      Height:         EXAM:  Gen: Frail, elderly, mildly tachypneic but minimal respiratory distress HEENT: NCAT, sclera white, oropharynx normal Neck: Supple without LAN, thyromegaly, +JVD Lungs: breath sounds: diminished in base, percussion: dull in bases, bibasilar crackles, no wheezes Cardiovascular: Reg, III/VI high pitched crescendo/decrescendo M across precordium Abdomen: Soft, nontender, normal BS Ext: chronic stasis changes, 1+ symmetric pitting pretibial edema Neuro: CNs grossly intact, motor and sensory intact Skin: Limited exam, no lesions noted  DATA:   BMP Latest Ref Rng & Units 06/06/2016 06/05/2016 12/24/2015  Glucose 65 - 99 mg/dL 94 161(W) 93  BUN 6 -  20 mg/dL 16(X42(H) 09(U41(H) 04(V29(H)  Creatinine 0.44 - 1.00 mg/dL 4.09(W1.13(H) 1.19(J1.28(H) 4.78(G1.05(H)  Sodium 135 - 145 mmol/L 139 138 139  Potassium 3.5 - 5.1 mmol/L 3.5 3.7 3.4(L)  Chloride 101 - 111 mmol/L 105 99(L) 98(L)  CO2 22 - 32 mmol/L 28 31 32  Calcium 8.9 - 10.3 mg/dL 8.1(L) 9.4 9.1    CBC Latest Ref Rng & Units 06/07/2016 06/06/2016 06/06/2016  WBC 3.6 - 11.0 K/uL 12.6(H) - 14.8(H)  Hemoglobin 12.0 - 16.0 g/dL 9.5(A9.8(L) 10.0(L) 8.9(L)  Hematocrit 35.0 - 47.0 % 28.6(L) - 25.7(L)  Platelets 150 - 440 K/uL 139(L) - 129(L)    CXR:   Multiple old healed L rib fractures, edema pattern, moderate bilateral effusions  IMPRESSION:   1) acute hypoxic respiratory failure 2) pulmonary edema with bilateral pleural effusions 3) documented hx of pulmonary fibrosis - I don't see any evidence of such of recent CT chest and believe that this is an inaccurate diagnosis 4) Hx of PAF - NSR presently 5) Harsh systolic M - Suspect aortic stenosis. No prior echocardiogram available 6) CKD 7) hypokalemia 8) recent GI bleed - no evidence of active bleeding 9) Mild acute blood loss anemia 10) Advanced age, frailty, DNR/DNI   PLAN:  1) Continue supplemental O2 to maintain SpO2 > 90% 2) I have changed BiPAP to PRN for distress or refractory hypoxemia 3) Lasix and KCl repletion ordered 4) Echocardiogram 5) Monitor BMET intermittently. Monitor I/Os. Correct electrolytes as indicated 6) DVT px: SCDs. Monitor CBC intermittently. Transfuse per usual guidelines   I discussed goals of care with pt's son who confirms DNR status. We emphasized that we should not undertake therapies or interventions unless they have a reasonable likelihood of achieving a favorable outcome. BiPAP will be used only if it relieves more discomfort than it causes   Billy Fischeravid Analise Glotfelty, MD PCCM service Mobile [phone] Pager [phone] 06/07/2016

## 2016-06-07 NOTE — Progress Notes (Signed)
Pt resting, no distress noted. Oxygen saturation continues 91% on 4 liters, HR improved 84. Pt reports no pain.

## 2016-06-07 NOTE — Consult Note (Signed)
Pt with tachycardia, prob at fib, rate up to 150.  Pt on hi flow oxygen.  hgb stable.  No plans for any GI procedures given her cardiopulmonary status.  Discussed with son and patient.

## 2016-06-07 NOTE — Progress Notes (Signed)
Pt being transported now

## 2016-06-07 NOTE — Progress Notes (Signed)
Report called to [facility], pt on non re breather RT to place pt on bipap, pt with no complaints

## 2016-06-07 NOTE — Progress Notes (Signed)
Pt has gone in to A-fib in the 160's. Dr. Imogene Burnhen notified. Acknowledged. Dr. Imogene Burnhen putting in orders. Dakiya Puopolo E 9:54 AM 06/07/2016

## 2016-06-07 NOTE — Progress Notes (Signed)
Pt. Trop: 0.83. Dr. Imogene Burnhen notified. Acknowledged. Entered orders. Rocklyn Mayberry E 10:15 AM 06/07/2016

## 2016-06-07 NOTE — Progress Notes (Signed)
Dr Imogene Burnhen made aware of chest xray result

## 2016-06-07 NOTE — Progress Notes (Signed)
Pt's SBP is slightly elevated. Maude LericheMagdalene Tukov, NP rounding on pt and ordered her at home BP medications. [facility].

## 2016-06-07 NOTE — Progress Notes (Addendum)
[facility] [place] at [facility]   PATIENT NAME: Kelly Booth    MR#:  [id number]  DATE OF BIRTH:  Nov 30, 1919  SUBJECTIVE:  CHIEF COMPLAINT:   Chief Complaint  Patient presents with  . Weakness   SOB and weakness, SAT down to 85-87% with venti-mask,  no bloody stool or melena after admission.  REVIEW OF SYSTEMS:  Review of Systems  Constitutional: Positive for malaise/fatigue. Negative for chills and fever.  HENT: Negative for congestion and sore throat.   Eyes: Negative for blurred vision and double vision.  Respiratory: Positive for shortness of breath. Negative for cough, sputum production and wheezing.   Cardiovascular: Negative for chest pain, palpitations and leg swelling.  Gastrointestinal: Negative for abdominal pain, blood in stool, diarrhea, melena, nausea and vomiting.  Genitourinary: Negative for dysuria and urgency.  Musculoskeletal: Negative for joint pain.  Skin: Negative for itching and rash.  Neurological: Positive for weakness. Negative for dizziness, focal weakness, loss of consciousness and headaches.    DRUG ALLERGIES:  No Known Allergies VITALS:  Blood pressure (!) 147/65, pulse 84, temperature 97.6 F (36.4 C), temperature source Oral, resp. rate (!) 32, height 5\' 3"  (1.6 m), weight 103 lb (46.7 kg), SpO2 91 %. PHYSICAL EXAMINATION:  Physical Exam  Constitutional: She is oriented to person, place, and time and well-developed, well-nourished, and in no distress. No distress.  HENT:  Head: Normocephalic.  Mouth/Throat: Oropharynx is clear and moist.  Eyes: Conjunctivae and EOM are normal. No scleral icterus.  Neck: Normal range of motion. Neck supple. No JVD present. No tracheal deviation present.  Cardiovascular: Normal rate and regular rhythm.  Exam reveals no gallop.   Murmur heard. 4/6 systolic murmur  Pulmonary/Chest: She is in respiratory distress. She has wheezes. She has no rales.  Tachypnea and bilateral crackles    Abdominal: Soft. Bowel sounds are normal. She exhibits no distension. There is no tenderness.  Musculoskeletal: Normal range of motion. She exhibits edema. She exhibits no tenderness.  Trace leg edema  Lymphadenopathy:    She has no cervical adenopathy.  Neurological: She is alert and oriented to person, place, and time. No cranial nerve deficit.  Skin: Skin is warm. No rash noted. No erythema.  Psychiatric: Affect normal.   LABORATORY PANEL:   CBC  Recent Labs Lab 06/07/16 0425  WBC 12.6*  HGB 9.8*  HCT 28.6*  PLT 139*   ------------------------------------------------------------------------------------------------------------------ Chemistries   Recent Labs Lab 06/05/16 1821 06/06/16 0337  NA 138 139  K 3.7 3.5  CL 99* 105  CO2 31 28  GLUCOSE 101* 94  BUN 41* 42*  CREATININE 1.28* 1.13*  CALCIUM 9.4 8.1*  MG 1.9  --    RADIOLOGY:  Dg Chest 1 View  Result Date: 06/07/2016 CLINICAL DATA:  82 year old female with a history of decreasing oxygen saturation EXAM: CHEST 1 VIEW COMPARISON:  08/21/2014, 10/18/2013, 10/14/2013, CT 10/11/2013 FINDINGS: Compare to the prior plain film there has been interval development of bilateral basilar opacity obscuring the heart borders in the bilateral hemidiaphragm. Calcifications of the aortic arch. Heart borders not well visualized. Thickening bilaterally of the pleural parenchymal interface, extending the apices. Mixed interstitial and airspace opacities bilaterally. Re- demonstration Similar appearance of bilateral healed and partially remodeled rib fractures. Partially remodeled fracture of the left clavicle. Osteopenia. IMPRESSION: Moderate to large bilateral pleural effusions with associated atelectasis. Mixed interstitial and airspace opacities of the bilateral upper lungs may reflect a combination of atelectasis, edema, and/or infection. Aortic atherosclerosis. Re-  demonstration of partially healed and remodeled bilateral rib  fractures as well as left clavicle fracture. Signed, Yvone NeuJaime S. Loreta AveWagner, DO [facility] Electronically Signed   By: Gilmer MorJaime  Wagner D.O.   On: 06/07/2016 08:03   ASSESSMENT AND PLAN:   This is a 82 y.o. female with a history of atrial fibrillation, hypertension, urinary fibrosis now being admitted with:  1. GI bleeding with anemia. Hb decreased to 8.9. But up to 9.8 and stable. disontinue IV Protonix drip, change to iv bid, follow-up Hb. -Nothing by mouth, Hold aspirin. Per Dr. Markham JordanElliot, no EGD indication this time.  2. UTI with Leukocytosis Follow up urine culture, CBC. Start rocephin.  3. AKI,  due to dehydration. Improved with IV fluid hydration  * right hydronephrosis with hydroureter, mild no indication for acute urologic intervention and Elective outpatient evaluation with Cystoscopy, Right RGP per urologist.  4. Atrial fibrillation with RVR, HR 160-170, cardizem 20 mg iv one dose, consider starting po 30 mg q6h if Dr. Markham JordanElliot start diet.  * Elevated troponin, possible due to demanding ischemia. F/u troponin and cardiology consult.  5. Hypertension, hold amlodipine, Imdur due to low blood pressure, BP is better,  restart Lasix.  6. Pulmonary fibrosis-continue Combivent 7. Osteoporosis-continue vitamin D and calcium 8. Iron deficiency-continue ferrous sulfate 9. Glaucoma-continue eye drops and artificial tears 10. GERD-IV Protonix drip per #1  Acute respiratory failure with hypoxia and hypercapnia (PH 7.28, PO2 59, PCO2 54) Start BIPAP, NEB, pulmonary consult. Transfer to stepdown.  Acute congestive heart failure, unknown diastolic or systolic, Restarted Lasix, cardiology consult follow-up echo.  Bilateral pleural effusion and pulmonary fibrosis. Restart asix and pulmonary consult. Echo.  All the records are reviewed and case discussed with Care Management/Social Worker. Management plans discussed with the patient's  son and daughter-in-law, and they are in agreement.  CODE STATUS: DO NOT RESUSCITATE  TOTAL CRITICAL TIME TAKING CARE OF THIS PATIENT: 66 minutes.   More than 50% of the time was spent in counseling/coordination of care: YES  POSSIBLE D/C IN >3 DAYS, DEPENDING ON CLINICAL CONDITION.   Shaune Pollackhen, Semya Klinke M.D on 06/07/2016 at 8:34 AM  Between 7am to 6pm - Pager - [phone]  After 6pm go to [email or web address] - Scientist, research (life sciences)password [email or web address]  [facility] [place] [facility]  Office  [phone]  CC: Primary care physician; No primary care provider on file.  Note: This dictation was prepared with Dragon dictation along with smaller phrase technology. Any transcriptional errors that result from this process are unintentional.

## 2016-06-07 NOTE — Plan of Care (Signed)
Problem: Nutrition: Goal: Adequate nutrition will be maintained Outcome: Progressing Pt NPO; NS infusing at 75 ml/hr

## 2016-06-08 DIAGNOSIS — I4891 Unspecified atrial fibrillation: Secondary | ICD-10-CM

## 2016-06-08 DIAGNOSIS — K922 Gastrointestinal hemorrhage, unspecified: Secondary | ICD-10-CM

## 2016-06-08 LAB — BASIC METABOLIC PANEL
Anion gap: 8 (ref 5–15)
BUN: 29 mg/dL — AB (ref 6–20)
CALCIUM: 8 mg/dL — AB (ref 8.9–10.3)
CO2: 29 mmol/L (ref 22–32)
Chloride: 104 mmol/L (ref 101–111)
Creatinine, Ser: 0.84 mg/dL (ref 0.44–1.00)
GFR calc Af Amer: >60 mL/min (ref >60)
GFR, EST NON AFRICAN AMERICAN: 58 mL/min — AB (ref >60)
GLUCOSE: 150 mg/dL — AB (ref 65–99)
Potassium: 3.4 mmol/L — ABNORMAL LOW (ref 3.5–5.1)
Sodium: 141 mmol/L (ref 135–145)

## 2016-06-08 LAB — CBC
HCT: 32.2 % — ABNORMAL LOW (ref 35.0–47.0)
Hemoglobin: 11 g/dL — ABNORMAL LOW (ref 12.0–16.0)
MCH: 33.6 pg (ref 26.0–34.0)
MCHC: 34 g/dL (ref 32.0–36.0)
MCV: 98.6 fL (ref 80.0–100.0)
PLATELETS: 169 10*3/uL (ref 150–440)
RBC: 3.27 MIL/uL — ABNORMAL LOW (ref 3.80–5.20)
RDW: 13.9 % (ref 11.5–14.5)
WBC: 16.1 10*3/uL — ABNORMAL HIGH (ref 3.6–11.0)

## 2016-06-08 LAB — URINE CULTURE

## 2016-06-08 LAB — MAGNESIUM: Magnesium: 1.9 mg/dL (ref 1.7–2.4)

## 2016-06-08 LAB — PHOSPHORUS: Phosphorus: 1.5 mg/dL — ABNORMAL LOW (ref 2.5–4.6)

## 2016-06-08 LAB — TROPONIN I: Troponin I: 0.52 ng/mL (ref <0.03)

## 2016-06-08 MED ORDER — MAGNESIUM SULFATE 2 GM/50ML IV SOLN
2.0000 g | Freq: Once | INTRAVENOUS | Status: AC
  Administered 2016-06-08: 2 g via INTRAVENOUS
  Filled 2016-06-08: qty 50

## 2016-06-08 MED ORDER — MORPHINE SULFATE (PF) 2 MG/ML IV SOLN
1.0000 mg | INTRAVENOUS | Status: DC | PRN
  Administered 2016-06-08: 1 mg via INTRAVENOUS
  Administered 2016-06-08: 2 mg via INTRAVENOUS
  Filled 2016-06-08 (×2): qty 1

## 2016-06-08 MED ORDER — POTASSIUM PHOSPHATES 15 MMOLE/5ML IV SOLN
30.0000 mmol | Freq: Once | INTRAVENOUS | Status: AC
  Administered 2016-06-08: 30 mmol via INTRAVENOUS
  Filled 2016-06-08: qty 10

## 2016-06-08 MED ORDER — DILTIAZEM HCL 30 MG PO TABS
30.0000 mg | ORAL_TABLET | Freq: Four times a day (QID) | ORAL | Status: DC
  Filled 2016-06-08: qty 1

## 2016-06-08 MED ORDER — FUROSEMIDE 10 MG/ML IJ SOLN: 20.0000 mg | Freq: Two times a day (BID) | INTRAMUSCULAR | Status: DC

## 2016-06-08 MED ORDER — DILTIAZEM HCL 25 MG/5ML IV SOLN
10.0000 mg | Freq: Once | INTRAVENOUS | Status: AC
  Administered 2016-06-08: 10 mg via INTRAVENOUS
  Filled 2016-06-08: qty 5

## 2016-06-08 MED ORDER — HALOPERIDOL LACTATE 5 MG/ML IJ SOLN
1.0000 mg | Freq: Once | INTRAMUSCULAR | Status: AC
  Administered 2016-06-08: 1 mg via INTRAVENOUS
  Filled 2016-06-08: qty 1

## 2016-06-08 MED ORDER — FUROSEMIDE 10 MG/ML IJ SOLN
20.0000 mg | Freq: Once | INTRAMUSCULAR | Status: AC
  Administered 2016-06-08: 20 mg via INTRAVENOUS
  Filled 2016-06-08: qty 2

## 2016-06-08 MED ORDER — DILTIAZEM LOAD VIA INFUSION: 10.0000 mg | Freq: Once | INTRAVENOUS | Status: DC

## 2016-06-08 MED ORDER — FUROSEMIDE 10 MG/ML IJ SOLN
40.0000 mg | Freq: Two times a day (BID) | INTRAMUSCULAR | Status: DC
  Administered 2016-06-08: 40 mg via INTRAVENOUS
  Filled 2016-06-08: qty 4

## 2016-06-08 MED ORDER — DIGOXIN 0.25 MG/ML IJ SOLN
0.2500 mg | Freq: Four times a day (QID) | INTRAMUSCULAR | Status: AC
  Administered 2016-06-08 (×2): 0.25 mg via INTRAVENOUS
  Filled 2016-06-08 (×2): qty 2

## 2016-06-08 MED ORDER — PANTOPRAZOLE SODIUM 40 MG PO TBEC
40.0000 mg | DELAYED_RELEASE_TABLET | Freq: Two times a day (BID) | ORAL | Status: DC
  Administered 2016-06-08 – 2016-06-11 (×6): 40 mg via ORAL
  Filled 2016-06-08 (×7): qty 1

## 2016-06-08 MED ORDER — FUROSEMIDE 10 MG/ML IJ SOLN
20.0000 mg | Freq: Three times a day (TID) | INTRAMUSCULAR | Status: AC
  Administered 2016-06-08 (×2): 20 mg via INTRAVENOUS
  Filled 2016-06-08 (×2): qty 2

## 2016-06-08 MED ORDER — CARVEDILOL 3.125 MG PO TABS: 3.1250 mg | ORAL_TABLET | Freq: Two times a day (BID) | ORAL | Status: DC

## 2016-06-08 MED ORDER — METOPROLOL TARTRATE 25 MG PO TABS
25.0000 mg | ORAL_TABLET | Freq: Two times a day (BID) | ORAL | Status: DC
  Administered 2016-06-08 (×2): 25 mg via ORAL
  Filled 2016-06-08 (×2): qty 1

## 2016-06-08 NOTE — Progress Notes (Addendum)
[facility] - [place] at [facility]   PATIENT NAME: Kelly Booth    MR#:  [id number]  DATE OF BIRTH:  1920/05/21  SUBJECTIVE:  CHIEF COMPLAINT:   Chief Complaint  Patient presents with  . Weakness   SOB and weakness, on HF O2 REVIEW OF SYSTEMS:  Review of Systems  Constitutional: Positive for malaise/fatigue. Negative for chills and fever.  HENT: Negative for congestion and sore throat.   Eyes: Negative for blurred vision and double vision.  Respiratory: Positive for shortness of breath. Negative for cough, sputum production and wheezing.   Cardiovascular: Negative for chest pain, palpitations and leg swelling.  Gastrointestinal: Negative for abdominal pain, blood in stool, diarrhea, melena, nausea and vomiting.  Genitourinary: Negative for dysuria and urgency.  Musculoskeletal: Negative for joint pain.  Skin: Negative for itching and rash.  Neurological: Positive for weakness. Negative for dizziness, focal weakness, loss of consciousness and headaches.    DRUG ALLERGIES:  No Known Allergies VITALS:  Blood pressure (!) 164/131, pulse 83, temperature 97.5 F (36.4 C), temperature source Oral, resp. rate (!) 28, height 5\' 3"  (1.6 m), weight 103 lb (46.7 kg), SpO2 93 %. PHYSICAL EXAMINATION:  Physical Exam  Constitutional: She is oriented to person, place, and time and well-developed, well-nourished, and in no distress. No distress.  HENT:  Head: Normocephalic.  Mouth/Throat: Oropharynx is clear and moist.  Eyes: Conjunctivae and EOM are normal. No scleral icterus.  Neck: Normal range of motion. Neck supple. No JVD present. No tracheal deviation present.  Cardiovascular: Normal rate and regular rhythm.  Exam reveals no gallop.   Murmur heard. 4/6 systolic murmur  Pulmonary/Chest: No respiratory distress. She has no wheezes. She has no rales.  Tachypnea and bilateral crackles  Abdominal: Soft. Bowel sounds are normal. She exhibits no distension. There is  no tenderness.  Musculoskeletal: Normal range of motion. She exhibits no edema or tenderness.  Trace leg edema  Lymphadenopathy:    She has no cervical adenopathy.  Neurological: She is alert and oriented to person, place, and time. No cranial nerve deficit.  Skin: Skin is warm. No rash noted. No erythema.  Psychiatric: Affect normal.   LABORATORY PANEL:   CBC  Recent Labs Lab 06/08/16 0503  WBC 16.1*  HGB 11.0*  HCT 32.2*  PLT 169   ------------------------------------------------------------------------------------------------------------------ Chemistries   Recent Labs Lab 06/08/16 0503  NA 141  K 3.4*  CL 104  CO2 29  GLUCOSE 150*  BUN 29*  CREATININE 0.84  CALCIUM 8.0*  MG 1.9   RADIOLOGY:  No results found. ASSESSMENT AND PLAN:   This is a 82 y.o. female with a history of atrial fibrillation, hypertension, urinary fibrosis now being admitted with:  1. GI bleeding with anemia. Hb decreased to 8.9, but stable. disontinued IV Protonix drip, change to po bid, follow-up Hb. -Nothing by mouth, Hold aspirin. Per Dr. Markham Jordan, no EGD indication this time. Advance to full liquid diet.  2. UTI with Leukocytosis Follow up urine culture, CBC. continue rocephin.  3. AKI,  due to dehydration. Improved with IV fluid hydration  * right hydronephrosis with hydroureter, mild no indication for acute urologic intervention and Elective outpatient evaluation with Cystoscopy, Right RGP per urologist.  4. Atrial fibrillation with RVR, HR was 160-170, cardizem 20 mg iv one dose, back to NSR, but Afib with RVR again just now, HR 140, give cardizem 10 mg iv one dose, start po lopressor.  * Elevated troponin, possible due to demanding ischemia. F/u  cardiology consult.  5. Hypertension, hold amlodipine, Imdur due to low blood pressure, BP is better,  restart Lasix.  6. Pulmonary fibrosis-continue Combivent 7. Osteoporosis-continue vitamin D and calcium 8. Iron  deficiency-continue ferrous sulfate 9. Glaucoma-continue eye drops and artificial tears 10. GERD-IV Protonix drip per #1  Acute respiratory failure with hypoxia and hypercapnia On HF O2, BIPAP prn, NEB, appreciate pulmonary consult.  Acute diastolic congestive heart failure, EF 60-70% per echo. continue Lasix, f/u cardiology  Bilateral moderate to large pleural effusion. Restarted asix, no thoracentesis per Dr. Nicholos Johnsamachandran.  Discussed with Dr. Nicholos Johnsamachandran. All the records are reviewed and case discussed with Care Management/Social Worker. Management plans discussed with the patient's son and they are in agreement.  CODE STATUS: DO NOT RESUSCITATE  TOTAL CRITICAL TIME TAKING CARE OF THIS PATIENT: 42  minutes.   More than 50% of the time was spent in counseling/coordination of care: YES  POSSIBLE D/C IN >3 DAYS, DEPENDING ON CLINICAL CONDITION.   Shaune Pollackhen, Johnnie Goynes M.D on 06/08/2016 at 8:30 AM  Between 7am to 6pm - Pager - [phone]  After 6pm go to [email or web address] - Scientist, research (life sciences)password [email or web address]  [facility] [place] [facility]  Office  [phone]  CC: Primary care physician; No primary care provider on file.  Note: This dictation was prepared with Dragon dictation along with smaller phrase technology. Any transcriptional errors that result from this process are unintentional.

## 2016-06-08 NOTE — Progress Notes (Signed)
Patient becoming increasingly agitated and continuously trying to get out of bed, Dr. Imogene Burnhen notified and ordered 1 mg of haldol. Order placed, QT interval WDL. Kelly KusterBrandi R Mansfield

## 2016-06-08 NOTE — Progress Notes (Signed)
Patient intermittently confused and agitated overnight, confused to time, place and situation multiple attempts at reorientation.  Son notified of patient confusion around 2330 spoke to patient on phone with some improvement. Difficulty maintaining SPO2 within MD parameters.  NP aware medications given with minimal improvement. Patient has no sleep hours to count, and remains easily agitated. Attempts to get oob. angry swatting at RN at times. See CHL for further assessment.

## 2016-06-08 NOTE — Progress Notes (Signed)
NP notified of patients continued decreased SPO2, orders received for lasix and morphine see CHL for further update. Will continue to assess for changes/need.

## 2016-06-08 NOTE — Consult Note (Signed)
ICU NOTE   PT PROFILE: 81 F resident of assisted living facility admitted 06/05/16 with hematemesis and melena. Transferred to ICU 09/03 with respiratory distress and hypoxemia  HPI:  Pt is presently on NRB with mild distress. Therefore, most of the history is obtained from the hospital records. I also interviewed her son. She lives semi-independently prior to this admission. She has previously-defined advanced directives and clarified that she wishes to be DNR/DNI. She has been evaluated by GI medicine and any endoscopic evaluation has been deferred. Her H/H have remained stable since hospitalization. On the morning of 09/03, she developed increased dyspnea and hypoxemia prompting transfer to ICU. At the time of my evaluation, she denied pain and appeared comfortable on NRB mask. There has been no fever, CP or sputum production.   ROS: No fever, myalgias/arthralgias, unexplained weight loss or weight gain No new focal weakness or sensory deficits No otalgia, hearing loss, visual changes, nasal and sinus symptoms, mouth and throat problems No neck pain or adenopathy No abdominal pain, N/V/D, diarrhea, change in bowel pattern No dysuria, change in urinary pattern   Vitals:   06/08/16 0600 06/08/16 0800 06/08/16 0802 06/08/16 0900  BP: (!) 164/131 (!) 155/68  (!) 142/85  Pulse: 83 80  (!) 108  Resp: (!) 28 (!) 26  (!) 31  Temp:  97.5 F (36.4 C)    TempSrc:  Oral    SpO2: 90% 91% 93% 92%  Weight:      Height:         EXAM:  Gen: Frail, elderly, awake and alert, mildly tachypneic but minimal respiratory distress HEENT: NCAT, sclera white, oropharynx normal Neck: Supple without LAN, thyromegaly, +JVD Lungs: breath sounds: diminished in base, percussion: dull in bases, bibasilar crackles, no wheezes. Cardiovascular: Reg, III/VI high pitched crescendo/decrescendo M across precordium Abdomen: Soft, nontender, normal BS Ext: chronic stasis changes, 1+ symmetric pitting pretibial  edema Neuro: CNs grossly intact, motor and sensory intact Skin: Limited exam, no lesions noted  DATA:   BMP Latest Ref Rng & Units 06/08/2016 06/06/2016 06/05/2016  Glucose 65 - 99 mg/dL 161(W) 94 960(A)  BUN 6 - 20 mg/dL 54(U) 98(J) 19(J)  Creatinine 0.44 - 1.00 mg/dL 4.78 2.95(A) 2.13(Y)  Sodium 135 - 145 mmol/L 141 139 138  Potassium 3.5 - 5.1 mmol/L 3.4(L) 3.5 3.7  Chloride 101 - 111 mmol/L 104 105 99(L)  CO2 22 - 32 mmol/L 29 28 31   Calcium 8.9 - 10.3 mg/dL 8.0(L) 8.1(L) 9.4    CBC Latest Ref Rng & Units 06/08/2016 06/07/2016 06/06/2016  WBC 3.6 - 11.0 K/uL 16.1(H) 12.6(H) -  Hemoglobin 12.0 - 16.0 g/dL 11.0(L) 9.8(L) 10.0(L)  Hematocrit 35.0 - 47.0 % 32.2(L) 28.6(L) -  Platelets 150 - 440 K/uL 169 139(L) -    CXR:  Multiple old healed L rib fractures, edema pattern, moderate bilateral effusions  IMPRESSION:   1) acute hypoxic respiratory failure 2) pulmonary edema with bilateral pleural effusions, R>L 3) documented hx of pulmonary fibrosis - I don't see any evidence of such of recent CT chest and believe that this is an inaccurate diagnosis 4) Hx of PAF - NSR presently 5) Harsh systolic M - Suspect aortic stenosis. No prior echocardiogram available 6) CKD 7) hypokalemia 8) recent GI bleed - no evidence of active bleeding 9) Mild acute blood loss anemia 10) Advanced age, frailty, DNR/DNI   PLAN:  1) Continue supplemental O2 to maintain SpO2 > 90% 2) Dogoxin today for afib with RVR.  3) Lasix  and KCl repletion ordered 4) Echocardiogram 5) Monitor BMET intermittently. Monitor I/Os. Correct electrolytes as indicated 6) DVT px: SCDs. Monitor CBC intermittently. Transfuse per usual guidelines  -Wells Guileseep Lynsi Dooner, M.D. 06/08/2016  Critical Care Attestation.  I have personally obtained a history, examined the patient, evaluated laboratory and imaging results, formulated the assessment and plan and placed orders. The Patient requires high complexity decision making for assessment  and support, frequent evaluation and titration of therapies, application of advanced monitoring technologies and extensive interpretation of multiple databases. The patient has critical illness that could lead imminently to failure of 1 or more organ systems and requires the highest level of physician preparedness to intervene.  Critical Care Time devoted to patient care services described in this note is 35 minutes and is exclusive of time spent in procedures.

## 2016-06-08 NOTE — Progress Notes (Signed)
Dr. Imogene Burnhen present in patient's room when heart rhythm converted to afib rvr. IV cardizem ordered and given. Rhythm now looks to be ST with PVCs. Kelly KusterBrandi R Booth

## 2016-06-09 ENCOUNTER — Inpatient Hospital Stay: Payer: Medicare Other

## 2016-06-09 DIAGNOSIS — Z515 Encounter for palliative care: Secondary | ICD-10-CM

## 2016-06-09 DIAGNOSIS — R531 Weakness: Secondary | ICD-10-CM

## 2016-06-09 DIAGNOSIS — Z789 Other specified health status: Secondary | ICD-10-CM

## 2016-06-09 DIAGNOSIS — Z66 Do not resuscitate: Secondary | ICD-10-CM

## 2016-06-09 DIAGNOSIS — R0600 Dyspnea, unspecified: Secondary | ICD-10-CM

## 2016-06-09 LAB — BASIC METABOLIC PANEL
Anion gap: 9 (ref 5–15)
Anion gap: 5 (ref 5–15)
BUN: 22 mg/dL — AB (ref 6–20)
BUN: 26 mg/dL — ABNORMAL HIGH (ref 6–20)
CALCIUM: 7.6 mg/dL — AB (ref 8.9–10.3)
CALCIUM: 7.5 mg/dL — AB (ref 8.9–10.3)
CHLORIDE: 94 mmol/L — AB (ref 101–111)
CO2: 37 mmol/L — ABNORMAL HIGH (ref 22–32)
CO2: 37 mmol/L — ABNORMAL HIGH (ref 22–32)
CREATININE: 0.84 mg/dL (ref 0.44–1.00)
Chloride: 93 mmol/L — ABNORMAL LOW (ref 101–111)
Creatinine, Ser: 0.87 mg/dL (ref 0.44–1.00)
GFR calc Af Amer: >60 mL/min (ref >60)
GFR calc Af Amer: >60 mL/min (ref >60)
GFR, EST NON AFRICAN AMERICAN: 58 mL/min — AB (ref >60)
GFR, EST NON AFRICAN AMERICAN: 55 mL/min — AB (ref >60)
GLUCOSE: 173 mg/dL — AB (ref 65–99)
Glucose, Bld: 96 mg/dL (ref 65–99)
Potassium: 2.8 mmol/L — ABNORMAL LOW (ref 3.5–5.1)
Potassium: 4.2 mmol/L (ref 3.5–5.1)
SODIUM: 140 mmol/L (ref 135–145)
SODIUM: 135 mmol/L (ref 135–145)

## 2016-06-09 LAB — MAGNESIUM: Magnesium: 1.9 mg/dL (ref 1.7–2.4)

## 2016-06-09 LAB — CBC
HCT: 32.9 % — ABNORMAL LOW (ref 35.0–47.0)
HEMOGLOBIN: 11.5 g/dL — AB (ref 12.0–16.0)
MCH: 33.5 pg (ref 26.0–34.0)
MCHC: 34.8 g/dL (ref 32.0–36.0)
MCV: 96.4 fL (ref 80.0–100.0)
Platelets: 174 10*3/uL (ref 150–440)
RBC: 3.42 MIL/uL — ABNORMAL LOW (ref 3.80–5.20)
RDW: 13.5 % (ref 11.5–14.5)
WBC: 12.2 10*3/uL — ABNORMAL HIGH (ref 3.6–11.0)

## 2016-06-09 LAB — PHOSPHORUS: PHOSPHORUS: 2.7 mg/dL (ref 2.5–4.6)

## 2016-06-09 MED ORDER — METOPROLOL TARTRATE 25 MG PO TABS
12.5000 mg | ORAL_TABLET | Freq: Two times a day (BID) | ORAL | Status: DC
  Administered 2016-06-09 – 2016-06-11 (×4): 12.5 mg via ORAL
  Filled 2016-06-09 (×4): qty 1

## 2016-06-09 MED ORDER — POTASSIUM CHLORIDE 10 MEQ/100ML IV SOLN
10.0000 meq | INTRAVENOUS | Status: AC
  Administered 2016-06-09 (×6): 10 meq via INTRAVENOUS
  Filled 2016-06-09 (×6): qty 100

## 2016-06-09 MED ORDER — POTASSIUM CHLORIDE CRYS ER 20 MEQ PO TBCR
40.0000 meq | EXTENDED_RELEASE_TABLET | ORAL | Status: DC
  Filled 2016-06-09: qty 2

## 2016-06-09 MED ORDER — SENNOSIDES-DOCUSATE SODIUM 8.6-50 MG PO TABS
1.0000 | ORAL_TABLET | Freq: Two times a day (BID) | ORAL | Status: DC
  Administered 2016-06-09 – 2016-06-11 (×5): 1 via ORAL
  Filled 2016-06-09 (×5): qty 1

## 2016-06-09 MED ORDER — AMLODIPINE BESYLATE 5 MG PO TABS
5.0000 mg | ORAL_TABLET | Freq: Every day | ORAL | Status: DC
  Administered 2016-06-09 – 2016-06-11 (×3): 5 mg via ORAL
  Filled 2016-06-09 (×3): qty 1

## 2016-06-09 NOTE — Consult Note (Signed)
Consultation Note Date: 06/09/2016   Patient Name: Kelly Booth  DOB: 1920/06/08  MRN: [id number]  Age / Sex: 82 y.o., female  PCP: No primary care provider on file. Referring Physician: Katharina Caper, MD  Reason for Consultation: Establishing goals of care and Psychosocial/spiritual support  HPI/Patient Profile:   82 y.o. female   admitted on 06/05/2016 with PMH  of atrial fibrillation, hypertension.  [place] at an [place], [facility], [place].   Admitted for workup and treatment for lower GI bleed, leukocytosis.  She currently in in the ICU after she developed increased dyspnea and hypoxemia  Family faces advanced care planning decisions and anticipatory care needs  Clinical Assessment and Goals of Care:  This NP Lorinda Creed reviewed medical records, received report from team, assessed the patient and then meet at the patient's bedside along with her only son/Kelly Booth/HPOA  to discuss diagnosis, prognosis, GOC, EOL wishes disposition and options.  A detailed discussion was had today regarding advanced directives.  Concepts specific to code status, artifical feeding and hydration, continued IV antibiotics and rehospitalization was had.  The difference between a aggressive medical intervention path  and a palliative comfort care path for this patient at this time was had.  Values and goals of care important to patient and family were attempted to be elicited.  Son is realisitic and understands the high risk for decompensation, however at this time he wants to treat the treatable and is hopeful for return to baseline, watchful waiting.  MOST form introduced  Concept of Hospice and Palliative Care were discussed  Natural trajectory and expectations at EOL were discussed.  Questions and concerns addressed.   Family encouraged to call with questions or concerns.  PMT will continue to support  holistically.    SUMMARY OF RECOMMENDATIONS    Code Status/Advance Care Planning:  DNR   Symptom Management:   Weakness: PT evaluation and treatment   Palliative Prophylaxis:   Aspiration, Bowel Regimen, Delirium Protocol, Frequent Pain Assessment and Oral Care  Additional Recommendations (Limitations, Scope, Preferences):  No Artificial Feeding  Psycho-social/Spiritual:   Desire for further Chaplaincy support:yes  Additional Recommendations: Education on Hospice  Prognosis:   < 6 months  Discharge Planning:  Son is requesting rehab services at the [facility] if any way possible.  Skilled Nursing Facility for rehab with Palliative care service follow-up      Primary Diagnoses: Present on Admission: . GI bleed   I have reviewed the medical record, interviewed the patient and family, and examined the patient. The following aspects are pertinent.  Past Medical History:  Diagnosis Date  . A-fib (HCC)   . Allergic rhinitis   . Constipation   . Dyspepsia   . Esophageal reflux   . Generalized weakness   . Glaucoma   . Hypertension   . Osteoporosis   . Pulmonary fibrosis (HCC)    There is no evidence of pulmonary fibrosis on CT chest   Social History   Social History  . Marital status: Widowed  Spouse name: N/A  . Number of children: N/A  . Years of education: N/A   Social History Main Topics  . Smoking status: Never Smoker  . Smokeless tobacco: Never Used  . Alcohol use No  . Drug use: No  . Sexual activity: Not Currently    Birth control/ protection: None   Other Topics Concern  . None   Social History Narrative  . None   Family History  Problem Relation Age of Onset  . Hypertension Other    Scheduled Meds: . amLODipine  5 mg Oral Daily  . bisacodyl  10 mg Oral Daily  . calcium-vitamin D  1 tablet Oral BID  . isosorbide mononitrate  30 mg Oral Daily  . latanoprost  1 drop Both Eyes QHS  . mouth rinse  15 mL Mouth Rinse BID    . metoprolol tartrate  25 mg Oral BID  . pantoprazole  40 mg Oral BID AC  . potassium chloride  10 mEq Intravenous Q1 Hr x 6  . senna-docusate  1 tablet Oral BID  . sodium chloride flush  3 mL Intravenous Q12H   Continuous Infusions:  PRN Meds:.acetaminophen **OR** acetaminophen, bisacodyl, ipratropium-albuterol, magnesium citrate, metoprolol, morphine injection, [DISCONTINUED] ondansetron **OR** ondansetron (ZOFRAN) IV, polyvinyl alcohol, zolpidem Medications Prior to Admission:  Prior to Admission medications   Medication Sig Start Date End Date Taking? Authorizing Provider  aspirin EC 81 MG tablet Take 1 tablet by mouth daily. 09/18/15  Yes Historical Provider, MD  bisacodyl (STIMULANT LAXATIVE) 5 MG EC tablet Take 2 tablets by mouth daily. 09/18/15  Yes Historical Provider, MD  Calcium Carbonate-Vitamin D (CALCIUM-VITAMIN D) 500-200 MG-UNIT tablet Take 1 tablet by mouth 2 (two) times daily. 09/18/15  Yes Historical Provider, MD  Cholecalciferol (VITAMIN D3) 1000 units CAPS Take 1 capsule by mouth daily. 09/18/15  Yes Historical Provider, MD  ferrous sulfate 325 (65 FE) MG tablet Take 1 tablet by mouth daily. 09/18/15  Yes Historical Provider, MD  furosemide (LASIX) 20 MG tablet Take 1 tablet by mouth daily. 09/18/15  Yes Historical Provider, MD  Ipratropium-Albuterol (COMBIVENT) 20-100 MCG/ACT AERS respimat Inhale 1 puff into the lungs 4 (four) times daily. 09/18/15  Yes Historical Provider, MD  isosorbide mononitrate (IMDUR) 30 MG 24 hr tablet Take 1 tablet by mouth daily. 09/18/15  Yes Historical Provider, MD  latanoprost (XALATAN) 0.005 % ophthalmic solution Apply 1 drop to eye at bedtime. 09/18/15  Yes Historical Provider, MD  polyvinyl alcohol (ARTIFICIAL TEARS) 1.4 % ophthalmic solution Apply 1 drop to eye 4 (four) times daily as needed. 09/18/15  Yes Historical Provider, MD  ranitidine (ZANTAC) 150 MG tablet Take 1 tablet by mouth 2 (two) times daily. 09/18/15  Yes Historical  Provider, MD  amLODipine (NORVASC) 5 MG tablet Take 1 tablet by mouth daily. 09/18/15   Historical Provider, MD   No Known Allergies Review of Systems  Unable to perform ROS: Acuity of condition  Neurological: Positive for weakness.    Physical Exam  Constitutional: She appears lethargic. She appears ill.  -frail, elderly  Cardiovascular: Bradycardia present.   Pulmonary/Chest: She has decreased breath sounds in the right lower field and the left lower field.  Musculoskeletal:  generalized weakness with muscle atrophy  Neurological: She appears lethargic.  Skin: Skin is warm and dry.    Vital Signs: BP (!) 156/40   Pulse (!) 47   Temp 97.6 F (36.4 C) (Oral)   Resp 14   Ht 5\' 3"  (1.6 m)  Wt 46.7 kg (103 lb)   SpO2 100%   BMI 18.25 kg/m  Pain Assessment: No/denies pain POSS *See Group Information*: 2-Acceptable,Slightly drowsy, easily aroused Pain Score: 0-No pain   SpO2: SpO2: 100 % O2 Device:SpO2: 100 % O2 Flow Rate: .O2 Flow Rate (L/min): 3 L/min  IO: Intake/output summary:  Intake/Output Summary (Last 24 hours) at 06/09/16 1224 Last data filed at 06/09/16 0803  Gross per 24 hour  Intake              311 ml  Output                1 ml  Net              310 ml    LBM: Last BM Date: 06/06/16 Baseline Weight: Weight: 45.4 kg (100 lb) Most recent weight: Weight: 46.7 kg (103 lb)      Palliative Assessment/Data: 30 % at best   Flowsheet Rows   Flowsheet Row Most Recent Value  Intake Tab  Referral Department  Hospitalist  Unit at Time of Referral  ICU  Palliative Care Primary Diagnosis  Pulmonary  Date Notified  06/08/16  Palliative Care Type  New Palliative care  Reason for referral  Clarify Goals of Care  Date of Admission  06/05/16  # of days IP prior to Palliative referral  3  Clinical Assessment  Psychosocial & Spiritual Assessment  Palliative Care Outcomes     Discussed with Dr Winona Legato Time In: 0830 Time Out: 0945 Time Total: 75 min    Greater than 50%  of this time was spent counseling and coordinating care related to the above assessment and plan.  Signed by: Lorinda Creed, NP   Please contact Palliative Medicine Team phone at [phone] for questions and concerns.  For individual provider: See Loretha Stapler

## 2016-06-09 NOTE — Progress Notes (Signed)
Patient has not voided during shift, bladder scan revealed 353 mL. Per protocol, continue to monitor patient for next 2 hours and bladder scan again if no output. Patient to be moved off unit per order, Dr. Winona LegatoVaickute paged to be made aware. Patient complains of no discomfort. Trudee KusterBrandi R Mansfield

## 2016-06-09 NOTE — Progress Notes (Signed)
RN Clydie BraunKaren made aware of patient's voiding status. Patient in room with 1 telemetry box verification completed. RN Clydie BraunKaren present in room to complete second verification. Trudee KusterBrandi R Mansfield

## 2016-06-09 NOTE — Progress Notes (Signed)
OT Cancellation Note  Patient Details Name: Kelly Booth MRN: [id number] DOB: 09/14/1920   Cancelled Treatment:    Reason Eval/Treat Not Completed: Medical issues which prohibited therapy (Pt. presents with low potassium of 2.8, and eleated Troponin levels. Will monitor, and eval as appropriate.)  Olegario MessierElaine Kavir Savoca, MS, OTR/L 06/09/2016, 10:52 AM

## 2016-06-09 NOTE — Progress Notes (Signed)
Dr. Winona LegatoVaickute notified of patient voiding status and order received to do I/O cath if bladder scan is greater than 400 ml's of urine. Patient alert to self, telemetry in place, no acute distress.

## 2016-06-09 NOTE — Progress Notes (Signed)
Dr. Winona LegatoVaickute made aware that patient has not voided, ordered to place order for I and o's q6h and bladder scans q6h (or PRN). Orders placed, patient being transferred to floor now. Will make RN aware on 2C. Trudee KusterBrandi R Mansfield

## 2016-06-09 NOTE — Progress Notes (Signed)
Report called to Clydie BraunKaren on 2C. Patient's son made aware of transfer. Will transfer patient shortly.  Trudee KusterBrandi R Mansfield

## 2016-06-09 NOTE — Progress Notes (Signed)
[facility] [place] [facility]   PATIENT NAME: Dewaine OatsHilda Kincaid    MR#:  [id number]  DATE OF BIRTH:  11/24/1919  SUBJECTIVE:  CHIEF COMPLAINT:   Chief Complaint  Patient presents with  . Weakness   Denies any shortness of breath, weaned off of high flow oxygen, now on 3 L of oxygen through nasal cannula, feels weak REVIEW OF SYSTEMS:  Review of Systems  Unable to perform ROS: Medical condition    DRUG ALLERGIES:  No Known Allergies VITALS:  Blood pressure (!) 156/40, pulse (!) 47, temperature 97.6 F (36.4 C), temperature source Oral, resp. rate 14, height 5\' 3"  (1.6 m), weight 46.7 kg (103 lb), SpO2 100 %. PHYSICAL EXAMINATION:  Physical Exam  Constitutional: She is oriented to person, place, and time and well-developed, well-nourished, and in no distress. No distress.  HENT:  Head: Normocephalic.  Mouth/Throat: Oropharynx is clear and moist.  Eyes: Conjunctivae and EOM are normal. No scleral icterus.  Neck: Normal range of motion. Neck supple. No JVD present. No tracheal deviation present.  Cardiovascular: Normal rate and regular rhythm.  Exam reveals no gallop.   Murmur heard. 4/6 systolic murmur  Pulmonary/Chest: She is in respiratory distress. She has no wheezes. She has no rales.  Tachypnea and bilateral crackles  Abdominal: Soft. Bowel sounds are normal. She exhibits no distension. There is no tenderness.  Musculoskeletal: Normal range of motion. She exhibits no edema or tenderness.  Trace leg edema  Lymphadenopathy:    She has no cervical adenopathy.  Neurological: She is alert and oriented to person, place, and time. No cranial nerve deficit.  Skin: Skin is warm. No rash noted. No erythema.  Psychiatric: Affect normal.   LABORATORY PANEL:   CBC  Recent Labs Lab 06/09/16 0602  WBC 12.2*  HGB 11.5*  HCT 32.9*  PLT 174    ------------------------------------------------------------------------------------------------------------------ Chemistries   Recent Labs Lab 06/09/16 0602  NA 140  K 2.8*  CL 94*  CO2 37*  GLUCOSE 96  BUN 22*  CREATININE 0.84  CALCIUM 7.6*  MG 1.9   RADIOLOGY:  Dg Chest 1 View  Result Date: 06/09/2016 CLINICAL DATA:  Shortness of breath. EXAM: CHEST 1 VIEW COMPARISON:  06/07/2016 .  08/21/2014. FINDINGS: Cardiomegaly with pulmonary vascular prominence and bilateral interstitial prominence with bilateral pleural effusions consistent congestive heart failure. No acute bony abnormality identified. Multiple left rib fractures again noted. IMPRESSION: Congestive heart failure with bilateral pulmonary edema and bilateral pleural effusions. Electronically Signed   By: Maisie Fushomas  Register   On: 06/09/2016 10:32   ASSESSMENT AND PLAN:   This is a 82 y.o. female with a history of atrial fibrillation, hypertension, urinary fibrosis now being admitted with:  1. GI bleeding with Acute posthemorrhagic anemia. Hb decreased to 8.9, but improved to 11.5 today. disontinued IV Protonix drip, change to po bid, follow-up Hb. Now on full liquid diet, Holding aspirin. Per Dr. Markham JordanElliot, no EGD indication this time.   2. UTI with Leukocytosis, urine cultures revealed multiple species, suggested recollection Patient received antibiotic therapy since 06/07/2016, completed 3 day course  3. AKI,  due to dehydration. Improved with IV fluid hydration  * right hydronephrosis with hydroureter, mild no indication for acute urologic intervention and Elective outpatient evaluation with Cystoscopy, Right RGP per urologist.  4. Atrial fibrillation with RVR, HR was 160-170, cardizem 20 mg iv one dose, back to NSR, but Afib with RVR again yesterday, HR 140, given cardizem 10 mg iv one dose, started  po lopressor. The patient remains in sinus rhythm, bradycardic in the 50s  * Elevated troponin, possible due to  demanding ischemia. F/u cardiology consult.  5. Hypertension, resume amlodipine, Imdur , BP has improved ,  restarted Lasix.  6. Pulmonary fibrosis-continue Combivent 7. Osteoporosis-continue vitamin D and calcium 8. Iron deficiency-continue ferrous sulfate 9. Glaucoma-continue eye drops and artificial tears 10. GERD-IV Protonix drip per #1  Acute respiratory failure with hypoxia and hypercapnia Off HF O2, now on 3 L of oxygen through nasal cannula, appreciate pulmonary input, BIPAP prn, NEB  Acute diastolic congestive heart failure, EF 60-70% per echo. continue Lasix, f/u cardiology  Bilateral moderate to large pleural effusion. Restarted lasix, no thoracentesis per Dr. Nicholos Johns.  Discussed with Dr. Nicholos Johns. All the records are reviewed and case discussed with Care Management/Social Worker. Management plans discussed with the patient's son and they are in agreement.  CODE STATUS: DO NOT RESUSCITATE  TOTAL CRITICAL TIME TAKING CARE OF THIS PATIENT: 40  minutes.   More than 50% of the time was spent in counseling/coordination of care: YES  POSSIBLE D/C IN >3 DAYS, DEPENDING ON CLINICAL CONDITION.   Katharina Caper M.D on 06/09/2016 at 11:34 AM  Between 7am to 6pm - Pager - [phone]  After 6pm go to [email or web address] - Scientist, research (life sciences) [facility]  Office  [phone]  CC: Primary care physician; No primary care provider on file.  Note: This dictation was prepared with Dragon dictation along with smaller phrase technology. Any transcriptional errors that result from this process are unintentional.

## 2016-06-09 NOTE — Progress Notes (Signed)
ICU NOTE   PT PROFILE: [age] F resident of assisted living facility admitted 06/05/16 with hematemesis and melena. Transferred to ICU 09/03 with respiratory distress and hypoxemia  Subjective  Pt is presently on 3L [place], breathing is significantly improved, no resp distress.   ROS: No fever, myalgias/arthralgias, unexplained weight loss or weight gain No new focal weakness or sensory deficits No otalgia, hearing loss, visual changes, nasal and sinus symptoms, mouth and throat problems No neck pain or adenopathy No abdominal pain, N/V/D, diarrhea, change in bowel pattern No dysuria, change in urinary pattern   Vitals:   06/09/16 0500 06/09/16 0600 06/09/16 0700 06/09/16 0800  BP: 131/60 (!) 141/52 (!) 108/37 (!) 156/40  Pulse: (!) 50 (!) 54 (!) 47 (!) 47  Resp: 16 16 17 14   Temp:    97.6 F (36.4 C)  TempSrc:    Oral  SpO2: 100% 100% 100% 100%  Weight:      Height:         EXAM:  Gen: Frail, elderly, awake and alert, mildly tachypneic but minimal respiratory distress HEENT: NCAT, sclera white, oropharynx normal Neck: Supple without LAN, thyromegaly, +JVD Lungs: breath sounds: diminished in base, percussion: dull in bases, bibasilar crackles, no wheezes. Cardiovascular: Reg, III/VI high pitched crescendo/decrescendo M across precordium Abdomen: Soft, nontender, normal BS Ext: chronic stasis changes, 1+ symmetric pitting pretibial edema Neuro: CNs grossly intact, motor and sensory intact Skin: Limited exam, no lesions noted  DATA:   BMP Latest Ref Rng & Units 06/09/2016 06/08/2016 06/06/2016  Glucose 65 - 99 mg/dL 96 409(W150(H) 94  BUN 6 - 20 mg/dL 11(B22(H) 14(N29(H) 82(N42(H)  Creatinine 0.44 - 1.00 mg/dL 5.620.84 1.300.84 8.65(H1.13(H)  Sodium 135 - 145 mmol/L 140 141 139  Potassium 3.5 - 5.1 mmol/L 2.8(L) 3.4(L) 3.5  Chloride 101 - 111 mmol/L 94(L) 104 105  CO2 22 - 32 mmol/L 37(H) 29 28  Calcium 8.9 - 10.3 mg/dL 7.6(L) 8.0(L) 8.1(L)    CBC Latest Ref Rng & Units 06/09/2016 06/08/2016 06/07/2016  WBC 3.6  - 11.0 K/uL 12.2(H) 16.1(H) 12.6(H)  Hemoglobin 12.0 - 16.0 g/dL 11.5(L) 11.0(L) 9.8(L)  Hematocrit 35.0 - 47.0 % 32.9(L) 32.2(L) 28.6(L)  Platelets 150 - 440 K/uL 174 169 139(L)    CXR:  Multiple old healed L rib fractures, edema pattern, moderate bilateral effusions  IMPRESSION:   1) acute hypoxic respiratory failure, improved with diuresis.  2) pulmonary edema with bilateral pleural effusions, R>L 3) documented hx of pulmonary fibrosis - I don't see any evidence of such of recent CT chest and believe that this is an inaccurate diagnosis 4) Hx of PAF - NSR presently 5) Harsh systolic M - Suspect aortic stenosis. No prior echocardiogram available 6) CKD 7) hypokalemia 8) recent GI bleed - no evidence of active bleeding 9) Mild acute blood loss anemia 10) Advanced age, frailty, DNR/DNI   PLAN:  1) Continue supplemental O2 to maintain SpO2 > 90% 2) Received digoxin yesterday, rate now controlled.   3) Lasix and KCl repletion ordered 4) Echocardiogram 5) Monitor BMET intermittently. Monitor I/Os. Correct electrolytes as indicated 6) DVT px: SCDs. Monitor CBC intermittently. Transfuse per usual guidelines 7) Replace K.   Wells Guiles-Deep Kenedy Haisley, M.D. 06/09/2016  Critical Care Attestation.  I have personally obtained a history, examined the patient, evaluated laboratory and imaging results, formulated the assessment and plan and placed orders. The Patient requires high complexity decision making for assessment and support, frequent evaluation and titration of therapies, application of advanced monitoring technologies and  extensive interpretation of multiple databases. The patient has critical illness that could lead imminently to failure of 1 or more organ systems and requires the highest level of physician preparedness to intervene.  Critical Care Time devoted to patient care services described in this note is 35 minutes and is exclusive of time spent in procedures.

## 2016-06-09 NOTE — Progress Notes (Signed)
PT Cancellation Note  Patient Details Name: Kelly Booth MRN: [id number] DOB: 08/12/1920   Cancelled Treatment:    Reason Eval/Treat Not Completed: Other (comment). Consult received and chart reviewed. Pt with low K+ at 2.8 and elevated troponin, however downward trending; with pending cardio consult. Pt with multiple bouts of RVR with most recent yesterday. Pt is now bradycardic. Not appropriate for PT intervention at this time. Will hold therapy until medically cleared by cardio.    Garren Greenman 06/09/2016, 1:59 PM Elizabeth PalauStephanie Mirabelle Cyphers, PT, DPT [phone]

## 2016-06-10 LAB — BASIC METABOLIC PANEL
Anion gap: 6 (ref 5–15)
BUN: 22 mg/dL — ABNORMAL HIGH (ref 6–20)
CHLORIDE: 94 mmol/L — AB (ref 101–111)
CO2: 36 mmol/L — ABNORMAL HIGH (ref 22–32)
CREATININE: 0.71 mg/dL (ref 0.44–1.00)
Calcium: 7.7 mg/dL — ABNORMAL LOW (ref 8.9–10.3)
Glucose, Bld: 103 mg/dL — ABNORMAL HIGH (ref 65–99)
POTASSIUM: 3.5 mmol/L (ref 3.5–5.1)
SODIUM: 136 mmol/L (ref 135–145)

## 2016-06-10 LAB — CBC
HCT: 29.8 % — ABNORMAL LOW (ref 35.0–47.0)
HEMOGLOBIN: 10.5 g/dL — AB (ref 12.0–16.0)
MCH: 33.4 pg (ref 26.0–34.0)
MCHC: 35.4 g/dL (ref 32.0–36.0)
MCV: 94.4 fL (ref 80.0–100.0)
Platelets: 188 10*3/uL (ref 150–440)
RBC: 3.15 MIL/uL — AB (ref 3.80–5.20)
RDW: 13.6 % (ref 11.5–14.5)
WBC: 9.4 10*3/uL (ref 3.6–11.0)

## 2016-06-10 MED ORDER — HALOPERIDOL LACTATE 5 MG/ML IJ SOLN: 1.0000 mg | Freq: Four times a day (QID) | INTRAMUSCULAR | Status: DC | PRN

## 2016-06-10 NOTE — Progress Notes (Signed)
PT Cancellation Note  Patient Details Name: Kelly Booth MRN: [id number] DOB: 02/16/1920   Cancelled Treatment:    Reason Eval/Treat Not Completed: Other (comment). Consult received and chart reviewed. Evaluation attempted, however pt eating breakfast and requesting PT to return at later time. Will re-attempt time permitting.   Nitasha Jewel 06/10/2016, 9:22 AM  Elizabeth PalauStephanie Sher Shampine, PT, DPT [phone]

## 2016-06-10 NOTE — Progress Notes (Signed)
[employer] - [place] at [facility]   PATIENT NAME: Kelly OatsHilda Lindholm    MR#:  [id number]  DATE OF BIRTH:  01/05/1920  SUBJECTIVE:  CHIEF COMPLAINT:   Chief Complaint  Patient presents with  . Weakness   Denies any shortness of breath, weaned off of high flow oxygen, now on 2 L of oxygen through nasal cannula, feels Him for the bowl. Patient was seen by physical therapist, skilled nursing facility placement was recommended, discussed with patient's son, he is present during my interview/patient's evaluation.  REVIEW OF SYSTEMS:  Review of Systems  Unable to perform ROS: Medical condition    DRUG ALLERGIES:  No Known Allergies VITALS:  Blood pressure (!) 142/48, pulse 77, temperature 97.9 F (36.6 C), temperature source Oral, resp. rate (!) 24, height 5\' 3"  (1.6 m), weight 46.7 kg (103 lb), SpO2 98 %. PHYSICAL EXAMINATION:  Physical Exam  Constitutional: She is oriented to person, place, and time and well-developed, well-nourished, and in no distress. No distress.  HENT:  Head: Normocephalic.  Mouth/Throat: Oropharynx is clear and moist.  Eyes: Conjunctivae and EOM are normal. No scleral icterus.  Neck: Normal range of motion. Neck supple. No JVD present. No tracheal deviation present.  Cardiovascular: Normal rate and regular rhythm.  Exam reveals no gallop.   Murmur heard. 4/6 systolic murmur  Pulmonary/Chest: She is in respiratory distress. She has no wheezes. She has no rales.  Tachypnea and bilateral crackles  Abdominal: Soft. Bowel sounds are normal. She exhibits no distension. There is no tenderness.  Musculoskeletal: Normal range of motion. She exhibits no edema or tenderness.  Trace leg edema  Lymphadenopathy:    She has no cervical adenopathy.  Neurological: She is alert and oriented to person, place, and time. No cranial nerve deficit.  Skin: Skin is warm. No rash noted. No erythema.  Psychiatric: Affect normal.   LABORATORY PANEL:    CBC  Recent Labs Lab 06/10/16 0515  WBC 9.4  HGB 10.5*  HCT 29.8*  PLT 188   ------------------------------------------------------------------------------------------------------------------ Chemistries   Recent Labs Lab 06/09/16 0602  06/10/16 0515  NA 140  < > 136  K 2.8*  < > 3.5  CL 94*  < > 94*  CO2 37*  < > 36*  GLUCOSE 96  < > 103*  BUN 22*  < > 22*  CREATININE 0.84  < > 0.71  CALCIUM 7.6*  < > 7.7*  MG 1.9  --   --   < > = values in this interval not displayed. RADIOLOGY:  No results found. ASSESSMENT AND PLAN:   This is a 82 y.o. female with a history of atrial fibrillation, hypertension, urinary fibrosis now being admitted with:  1. GI bleeding with Acute posthemorrhagic anemia. Hb decreased to 8.9, but improved to 10.5 today. Now, off IV Protonix drip, on  po bid, follow-up Hb daily. Advance diet to soft, Holding aspirin. Per Dr. Markham JordanElliot, no EGD indication this time.   2. UTI with Leukocytosis, urine cultures revealed multiple species, suggested recollection Patient received antibiotic therapy since 06/07/2016, completed 3 day course, now off antibiotic therapy   3. AKI,  due to dehydration/infection/GI bleed. Improved with IV fluid hydration  4 right hydronephrosis with hydroureter, mild no indication for acute urologic intervention and Elective outpatient evaluation with Cystoscopy, Right RGP per urologist.  5. Atrial fibrillation with RVR, HR was 160-170, cardizem 20 mg iv one dose, back to NSR, but Afib with RVR again , few days ago, HR  140, given cardizem 10 mg iv one dose, started po lopressor. The patient remains in sinus rhythm, on low-dose of Lopressor, bradycardia has resolved  6 Elevated troponin, due to demanding ischemia. Echocardiogram revealed severe aortic stenosis, normal ejection fraction, moderately severe tricuspid, and mitral valve regurgitation, elevated pulmonary arterial pressure, no wall motion abnormalities.   7.  Hypertension, improved with amlodipine, Imdur ,  restarted Lasix.  8. Pulmonary fibrosis-continue Combivent  9. Osteoporosis-continue vitamin D and calcium  10. Iron deficiency-continue ferrous sulfate  11. Glaucoma-continue eye drops and artificial tears  12. GERD-IV Protonix drip per #1  13 Acute respiratory failure with hypoxia and hypercapnia Off HF O2, now on 2 L of oxygen through nasal cannula, appreciate pulmonary input, BIPAP prn, NEB,improving overall  14. Acute diastolic congestive heart failure, EF 60-70% per echo. continue Lasix, f/u with cardiology outpatient, no other interventions or evaluations were recommended  15. Bilateral moderate to large pleural effusion. Restarted lasix, no thoracentesis per Dr. Nicholos Johns.  16. Generalized weakness, physical therapist evaluated patient and recommended skilled nursing facility placement, getting social work involved for bed search, patient will be discharged to skilled nursing facility with palliative care follow-up.   All the records are reviewed and case discussed with Care Management/Social Worker. Management plans discussed with the patient's son and they are in agreement.  CODE STATUS: DO NOT RESUSCITATE  TOTAL CRITICAL TIME TAKING CARE OF THIS PATIENT: 40  minutes.  Discussed with patient's son extensively. All questions were answered, he voiced understanding More than 50% of the time was spent in counseling/coordination of care: YES  POSSIBLE D/C IN >3 DAYS, DEPENDING ON CLINICAL CONDITION.   Katharina Caper M.D on 06/10/2016 at 12:54 PM  Between 7am to 6pm - Pager - [phone]  After 6pm go to [email or web address] - Scientist, research (life sciences) [facility]  Office  [phone]  CC: Primary care physician; No primary care provider on file.  Note: This dictation was prepared with Dragon dictation along with smaller phrase technology. Any transcriptional errors that result from this process are  unintentional.

## 2016-06-10 NOTE — Care Management (Cosign Needed)
Patient is a resident in the independent living portion of [facility].  She has her own room, is able to perform her own adls, take her own medications and is able to walk to the dining room for all her meals.  She is supported by her son and grandsons.  No issues with transportation, current with PCP- Yates DecampJohn Walker.  Has a rollator walker. Spoke with Bill- the DON of the [facility] says that if patient can return directly to her independent living level of care she could receive home health nursing and physical therapy through any home health agency.  If she is in need of just physical therapy, the facility could provide therapy under Medicare B.  PT and OT consults are pending.  has been assessed by palliative care. She presented to the ED with gi bleeding was transferred to ICU during this stay and then transferred to [facility] 9/5.

## 2016-06-10 NOTE — Evaluation (Signed)
Physical Therapy Evaluation Patient Details Name: Kelly Booth MRN: [id number] DOB: 08/16/1920 Today's Date: 06/10/2016   History of Present Illness  Pt admitted for hematemesis. Pt with history of GERD and pulm fibrosis. Pt currently lives in indep living. Stay has been complicated by dyspnea and hypoxemia.  Clinical Impression  Pt is a pleasant 82 year old female who was admitted for hematemesis. Pt performs bed mobility, transfers, and ambulation with min assist and rw. Pt with severe post leaning noted, needs hands on assist to maintain balance. Pt demonstrates deficits with strength/balance/mobility.  All mobility performed on room air, however O2 sats decreased to 84%, therefore 2L of O2 reapplied. Sats improved to 92% with cues for pursed lip breathing. Would benefit from skilled PT to address above deficits and promote optimal return to PLOF; recommend transition to STR upon discharge from acute hospitalization.       Follow Up Recommendations SNF    Equipment Recommendations       Recommendations for Other Services       Precautions / Restrictions Precautions Precautions: Fall Restrictions Weight Bearing Restrictions: No      Mobility  Bed Mobility Overal bed mobility: Needs Assistance Bed Mobility: Supine to Sit     Supine to sit: Min assist     General bed mobility comments: Pt needs increased assist to perform bed mobility, including assist for reaching for hand rails. Once seated at EOB, pt able to sit with cga.  Transfers Overall transfer level: Needs assistance Equipment used: Rolling walker (2 wheeled) Transfers: Sit to/from Stand Sit to Stand: Min assist         General transfer comment: assist for transfer including cues for upright posture.  Pt with post leaning noted with static stance.  Ambulation/Gait Ambulation/Gait assistance: Min assist Ambulation Distance (Feet): 20 Feet Assistive device: Rolling walker (2 wheeled) Gait  Pattern/deviations: Step-through pattern     General Gait Details: ambulated using rw and min assist secondary to post leaning noted. Manual assist needed to guide rw and assist for obstacle avoidance. Very slow reciprocal gait noted.  Stairs            Wheelchair Mobility    Modified Rankin (Stroke Patients Only)       Balance Overall balance assessment: Needs assistance Sitting-balance support: Feet supported Sitting balance-Leahy Scale: Fair     Standing balance support: Bilateral upper extremity supported Standing balance-Leahy Scale: Fair                               Pertinent Vitals/Pain Pain Assessment: No/denies pain Pain Score: 0-No pain    Home Living Family/patient expects to be discharged to::  (indep living)               Home Equipment: Walker - 4 wheels      Prior Function Level of Independence: Independent         Comments: per patient, she does not require AD at indep living.     Hand Dominance        Extremity/Trunk Assessment   Upper Extremity Assessment: Overall WFL for tasks assessed           Lower Extremity Assessment: Generalized weakness (B LE grossly 4/5)         Communication   Communication: No difficulties  Cognition Arousal/Alertness: Awake/alert Behavior During Therapy: WFL for tasks assessed/performed Overall Cognitive Status: Within Functional Limits for tasks assessed  General Comments      Exercises Other Exercises Other Exercises: Seated ther-ex performed on B LE including hip abd/add, hip add squeezes, SAQ, and ankle pumps. All ther-ex performed x 10 reps with min assist. Cues given for correct technique.      Assessment/Plan    PT Assessment Patient needs continued PT services  PT Diagnosis Difficulty walking;Generalized weakness   PT Problem List Decreased strength;Decreased balance;Decreased mobility  PT Treatment Interventions DME  instruction;Gait training;Therapeutic exercise   PT Goals (Current goals can be found in the Care Plan section) Acute Rehab PT Goals Patient Stated Goal: to go home PT Goal Formulation: With patient Time For Goal Achievement: 06/24/16 Potential to Achieve Goals: Good    Frequency Min 2X/week   Barriers to discharge        Co-evaluation               End of Session Equipment Utilized During Treatment: Gait belt;Oxygen Activity Tolerance: Patient tolerated treatment well Patient left: in chair;with chair alarm set Nurse Communication: Mobility status         Time: 1050-1105 PT Time Calculation (min) (ACUTE ONLY): 15 min   Charges:   PT Evaluation $PT Eval Moderate Complexity: 1 Procedure PT Treatments $Therapeutic Exercise: 8-22 mins   PT G Codes:        Roslynn Holte June 14, 2016, 1:38 PM  Elizabeth Palau, PT, DPT [phone]

## 2016-06-10 NOTE — Evaluation (Signed)
Occupational Therapy Evaluation Patient Details Name: Kelly Booth MRN: [id number] DOB: [birth year] Today's Date: 06/10/2016    History of Present Illness Pt. is a 82 y.o. female who was admitted with a GI Bleed. Pt. PMHx includes: HTN, A-Fib, and Pulmonary Fibrosis.   Clinical Impression   Pt. Is a 82 y.o. Female who was admitted to [facility] with a GI Bleed. Pt. Presents with limited activity tolerance, and decreased functional mobility for ADLs which hinder her ability to complete ADL tasks. Pt. Could benefit from skilled OT services for ADL and A/E training, UE ther. Ex, energy conservation, and work simplification techniques to improve ADL and IADL functioning. Pt. Plans to return to [facility] upon discharge. Pt. Could benefit from follow-up OT services upon discharge.    Follow Up Recommendations  SNF    Equipment Recommendations       Recommendations for Other Services       Precautions / Restrictions Restrictions Weight Bearing Restrictions: No       Bed Mobility    Pt. Up in chair upon arrival                Transfers    Min guard                    Balance                                            ADL Overall ADL's : Needs assistance/impaired Eating/Feeding: Set up   Grooming: Set up               Lower Body Dressing: Supervision/safety               Functional mobility during ADLs: Min guard       Vision     Perception     Praxis      Pertinent Vitals/Pain Pain Assessment: 0-10 Pain Score: 0-No pain     Hand Dominance     Extremity/Trunk Assessment Upper Extremity Assessment Upper Extremity Assessment: Overall WFL for tasks assessed           Communication     Cognition Arousal/Alertness: Awake/alert Behavior During Therapy: WFL for tasks assessed/performed Overall Cognitive Status: Within Functional Limits for tasks assessed                     General Comments        Exercises       Shoulder Instructions      Home Living Family/patient expects to be discharged to:: Assisted living                             Home Equipment: Walker - 4 wheels          Prior Functioning/Environment Level of Independence: Independent with assistive device(s)             OT Diagnosis: Generalized weakness   OT Problem List: Decreased strength;Decreased range of motion;Decreased activity tolerance   OT Treatment/Interventions: Self-care/ADL training;Therapeutic activities;Neuromuscular education;Patient/family education    OT Goals(Current goals can be found in the care plan section) Acute Rehab OT Goals Patient Stated Goal: To return to [facility] OT Goal Formulation: With patient Potential to Achieve Goals: Good  OT Frequency: Min 1X/week   Barriers to D/C:  Co-evaluation              End of Session    Activity Tolerance: Patient tolerated treatment well Patient left: in chair;with call bell/phone within reach;with nursing/sitter in room (Nursing to take vitals, and change pt.)   Time: 1130-1155 OT Time Calculation (min): 25 min Charges:  OT General Charges $OT Visit: 1 Procedure OT Evaluation $OT Eval Moderate Complexity: 1 Procedure G-Codes:    Olegario MessierElaine Orrin Yurkovich, MS, OTR/L 06/10/2016, 12:05 PM

## 2016-06-11 DIAGNOSIS — I350 Nonrheumatic aortic (valve) stenosis: Secondary | ICD-10-CM

## 2016-06-11 DIAGNOSIS — N289 Disorder of kidney and ureter, unspecified: Secondary | ICD-10-CM

## 2016-06-11 DIAGNOSIS — J9602 Acute respiratory failure with hypercapnia: Secondary | ICD-10-CM

## 2016-06-11 DIAGNOSIS — J9601 Acute respiratory failure with hypoxia: Secondary | ICD-10-CM

## 2016-06-11 DIAGNOSIS — J90 Pleural effusion, not elsewhere classified: Secondary | ICD-10-CM

## 2016-06-11 DIAGNOSIS — R7989 Other specified abnormal findings of blood chemistry: Secondary | ICD-10-CM

## 2016-06-11 DIAGNOSIS — N1330 Unspecified hydronephrosis: Secondary | ICD-10-CM

## 2016-06-11 DIAGNOSIS — R778 Other specified abnormalities of plasma proteins: Secondary | ICD-10-CM

## 2016-06-11 DIAGNOSIS — D62 Acute posthemorrhagic anemia: Secondary | ICD-10-CM

## 2016-06-11 DIAGNOSIS — N390 Urinary tract infection, site not specified: Secondary | ICD-10-CM

## 2016-06-11 DIAGNOSIS — I4891 Unspecified atrial fibrillation: Secondary | ICD-10-CM

## 2016-06-11 DIAGNOSIS — I5031 Acute diastolic (congestive) heart failure: Secondary | ICD-10-CM

## 2016-06-11 LAB — BASIC METABOLIC PANEL
Anion gap: 7 (ref 5–15)
BUN: 24 mg/dL — AB (ref 6–20)
CHLORIDE: 98 mmol/L — AB (ref 101–111)
CO2: 34 mmol/L — ABNORMAL HIGH (ref 22–32)
CREATININE: 0.7 mg/dL (ref 0.44–1.00)
Calcium: 8.3 mg/dL — ABNORMAL LOW (ref 8.9–10.3)
GFR calc Af Amer: >60 mL/min (ref >60)
GFR calc non Af Amer: >60 mL/min (ref >60)
GLUCOSE: 93 mg/dL (ref 65–99)
POTASSIUM: 3.9 mmol/L (ref 3.5–5.1)
SODIUM: 139 mmol/L (ref 135–145)

## 2016-06-11 LAB — HEMOGLOBIN: HEMOGLOBIN: 10.2 g/dL — AB (ref 12.0–16.0)

## 2016-06-11 MED ORDER — METOPROLOL TARTRATE 25 MG PO TABS: 12.5000 mg | ORAL_TABLET | Freq: Two times a day (BID) | ORAL | 5 refills | Status: AC

## 2016-06-11 MED ORDER — PANTOPRAZOLE SODIUM 40 MG PO TBEC: 40.0000 mg | DELAYED_RELEASE_TABLET | Freq: Two times a day (BID) | ORAL | 5 refills | Status: AC

## 2016-06-11 NOTE — NC FL2 (Signed)
[place] MEDICAID FL2 LEVEL OF CARE SCREENING TOOL     IDENTIFICATION  Patient Name: Kelly Booth Birthdate: January 05, 1920 Sex: female Admission Date (Current Location): 06/05/2016  [place] and [place] Number:  Chiropodist and Address:  [facility], [address] [place], [place] [address]      Provider Number: [id number]  Attending Physician Name and Address:  Katharina Caper, MD  Relative Name and Phone Number:       Current Level of Care: Hospital Recommended Level of Care: Skilled Nursing Facility Prior Approval Number:    Date Approved/Denied:   PASRR Number:  [id number])  Discharge Plan: SNF    Current Diagnoses: Patient Active Problem List   Diagnosis Date Noted  . Acute respiratory failure with hypoxia and hypercapnia (HCC) 06/11/2016  . Acute diastolic CHF (congestive heart failure) (HCC) 06/11/2016  . Bilateral pleural effusion 06/11/2016  . Severe aortic stenosis 06/11/2016  . Acute posthemorrhagic anemia 06/11/2016  . Acute renal insufficiency 06/11/2016  . A. fib, RVR 06/11/2016  . Atrial fibrillation with RVR (HCC) 06/11/2016  . Elevated troponin 06/11/2016  . UTI (urinary tract infection) 06/11/2016  . DNR (do not resuscitate) 06/09/2016  . Palliative care by specialist 06/09/2016  . Weakness generalized 06/09/2016  . Dyspnea   . Bleeding gastrointestinal 06/05/2016  . Malnutrition of moderate degree 12/06/2015  . Bradycardia 12/05/2015    Orientation RESPIRATION BLADDER Height & Weight     Self, Time, Place  O2 (Nasal Cannula 1 (L/min) ) Incontinent Weight: 92 lb 1.6 oz (41.8 kg) Height:  5\' 3"  (160 cm)  BEHAVIORAL SYMPTOMS/MOOD NEUROLOGICAL BOWEL NUTRITION STATUS   (None)  (None) Incontinent Diet (DYS 2)  AMBULATORY STATUS COMMUNICATION OF NEEDS Skin   Limited Assist Verbally Normal                       Personal Care Assistance Level of Assistance  Bathing, Feeding, Dressing Bathing Assistance:  Limited assistance Feeding assistance: Limited assistance Dressing Assistance: Limited assistance     Functional Limitations Info  Sight, Hearing, Speech Sight Info: Impaired Hearing Info: Adequate Speech Info: Adequate    SPECIAL CARE FACTORS FREQUENCY    PT (By licensed PT)  OT (By licensed OT)    PT Frequency: 5X Day 5X week        OT Frequency: 5X Day 5X week              Contractures      Additional Factors Info  Code Status, Allergies Code Status Info:  (DNR) Allergies Info:  (No Known Allergies)           Current Medications (06/11/2016):  This is the current hospital active medication list Current Facility-Administered Medications  Medication Dose Route Frequency Provider Last Rate Last Dose  . acetaminophen (TYLENOL) tablet 650 mg  650 mg Oral Q6H PRN Alexis Hugelmeyer, DO       Or  . acetaminophen (TYLENOL) suppository 650 mg  650 mg Rectal Q6H PRN Alexis Hugelmeyer, DO      . amLODipine (NORVASC) tablet 5 mg  5 mg Oral Daily Katharina Caper, MD   5 mg at 06/10/16 1155  . bisacodyl (DULCOLAX) EC tablet 10 mg  10 mg Oral Daily Alexis Hugelmeyer, DO   10 mg at 06/10/16 1151  . bisacodyl (DULCOLAX) EC tablet 5 mg  5 mg Oral Daily PRN Alexis Hugelmeyer, DO      . calcium-vitamin D (OSCAL WITH D) 500-200 MG-UNIT per  tablet 1 tablet  1 tablet Oral BID Alexis Hugelmeyer, DO   1 tablet at 06/10/16 2142  . haloperidol lactate (HALDOL) injection 1 mg  1 mg Intravenous Q6H PRN Katharina Caperima Vaickute, MD      . ipratropium-albuterol (DUONEB) 0.5-2.5 (3) MG/3ML nebulizer solution 3 mL  3 mL Inhalation Q4H PRN Shaune PollackQing Chen, MD      . isosorbide mononitrate (IMDUR) 24 hr tablet 30 mg  30 mg Oral Daily Lewie LoronMagadalene S Tukov, NP   30 mg at 06/10/16 1156  . latanoprost (XALATAN) 0.005 % ophthalmic solution 1 drop  1 drop Both Eyes QHS Alexis Hugelmeyer, DO   1 drop at 06/10/16 2142  . magnesium citrate solution 1 Bottle  1 Bottle Oral Once PRN Alexis Hugelmeyer, DO      . MEDLINE mouth rinse   15 mL Mouth Rinse BID Shaune PollackQing Chen, MD   15 mL at 06/10/16 1156  . metoprolol (LOPRESSOR) injection 2.5-5 mg  2.5-5 mg Intravenous Q3H PRN Merwyn Katosavid B Simonds, MD      . metoprolol tartrate (LOPRESSOR) tablet 12.5 mg  12.5 mg Oral BID Katharina Caperima Vaickute, MD   12.5 mg at 06/10/16 2142  . morphine 2 MG/ML injection 1-2 mg  1-2 mg Intravenous Q2H PRN Lewie LoronMagadalene S Tukov, NP   1 mg at 06/08/16 2205  . ondansetron (ZOFRAN) injection 4 mg  4 mg Intravenous Q6H PRN Alexis Hugelmeyer, DO      . pantoprazole (PROTONIX) EC tablet 40 mg  40 mg Oral BID AC Shaune PollackQing Chen, MD   40 mg at 06/11/16 0842  . polyvinyl alcohol (LIQUIFILM TEARS) 1.4 % ophthalmic solution 1 drop  1 drop Both Eyes QID PRN Alexis Hugelmeyer, DO   1 drop at 06/07/16 1421  . senna-docusate (Senokot-S) tablet 1 tablet  1 tablet Oral BID Shane CrutchPradeep Ramachandran, MD   1 tablet at 06/10/16 2142  . sodium chloride flush (NS) 0.9 % injection 3 mL  3 mL Intravenous Q12H Alexis Hugelmeyer, DO   3 mL at 06/10/16 2143  . zolpidem (AMBIEN) tablet 5 mg  5 mg Oral QHS PRN Alexis Hugelmeyer, DO   5 mg at 06/08/16 2046     Discharge Medications: Please see discharge summary for a list of discharge medications.  Relevant Imaging Results:  Relevant Lab Results:   Additional Information  (SSN [id number])  Verta Ellenhristina E Terik Haughey, LCSW

## 2016-06-11 NOTE — Clinical Social Work Note (Signed)
Patient discharging today to [facility]. [name] at [facility] has been notified and discharge information sent. Nurse to call report. DNR taken to MD to sign. CSW contacted patient's son and notified him. CSW discussed transportation options and limitations regarding EMS regulations for non emergent transport. CSW explained that although she is on oxygen, that she is able to stand and pivot and can sit up in a wheelchair. Patient's son has decided to transport and CSW made arrangements for patient's son to be able to [facility] and bring it to transport her with. York SpanielMonica Kameko Hukill MSW,LCSW [phone]

## 2016-06-11 NOTE — Discharge Summary (Signed)
[facility] [place] at [facility]   PATIENT NAME: Kelly Booth    MR#:  [id number]  DATE OF BIRTH:  [birth year]  DATE OF ADMISSION:  06/05/2016 ADMITTING PHYSICIAN: Jon Gills Hugelmeyer, DO  DATE OF DISCHARGE: No discharge date for patient encounter.  PRIMARY CARE PHYSICIAN: No primary care provider on file.     ADMISSION DIAGNOSIS:  Gastrointestinal hemorrhage, unspecified gastritis, unspecified gastrointestinal hemorrhage type [K92.2]  DISCHARGE DIAGNOSIS:  Principal Problem:   Acute respiratory failure with hypoxia and hypercapnia (HCC) Active Problems:   Dyspnea   Acute diastolic CHF (congestive heart failure) (HCC)   Bilateral pleural effusion   Severe aortic stenosis   Bleeding gastrointestinal   Acute posthemorrhagic anemia   A. fib, RVR   Atrial fibrillation with RVR (HCC)   Elevated troponin   UTI (urinary tract infection)   DNR (do not resuscitate)   Palliative care by specialist   Weakness generalized   Acute renal insufficiency   SECONDARY DIAGNOSIS:   Past Medical History:  Diagnosis Date  . A-fib (HCC)   . Allergic rhinitis   . Constipation   . Dyspepsia   . Esophageal reflux   . Generalized weakness   . Glaucoma   . Hypertension   . Osteoporosis   . Pulmonary fibrosis (HCC)    There is no evidence of pulmonary fibrosis on CT chest    .pro HOSPITAL COURSE:   Patient is a 82 year old Caucasian female with history of constipation, generalized weakness, glaucoma, hypertension, pulmonary fibrosis, who presents to the hospital with complaints of loose stools with gastrointestinal bleeding. Patient did have vomiting in the emergency room which was dark red and bloody. Patient was admitted to the hospital for further evaluation and treatment. She was initiated on Protonix intravenously, gastroenterologist consultation was obtained. She was resuscitated with IV fluids. Gastroenterologist felt that patient should be treated medically,  no interventions were performed while in the hospital. Aspirin was stopped. Patient's hemoglobin level was checked periodically, she did not need transfusion. Her bleeding stopped and she was restarted on diet, which was advanced to soft diet prior to discharge from the hospital. Hemoglobin level remained stable and it was 10.2 on the day of discharge. While in the hospital being resuscitated with IV fluids, patient developed severe shortness of breath, she was transferred to ICU on 06/07/2016 with respiratory distress and hypoxemia, requiring high flow oxygen. Chest x-ray revealed moderate to large bilateral pleural effusions with associated atelectasis. Patient was seen by intensivist/pulmonologist, who recommended to restart her on Lasix. He also recommended to continue oxygen therapy chronically. Echocardiogram was performed while she was in the hospital, which showed increased wall thickness, consistent with mild CHF, normal ejection fraction of 65-70%, normal wall motions, aortic valve was noted to be calcified, there was a severe stenosis noted and mild regurgitation, mitral valve was also calcified, moderate to severe aortic regurgitation noted, questionable vegetation versus prolapse and posterior leaflet was noted as well. Patient was consulted by palliative care, patient's case was discussed this patient's family, and they were agreeable to discharge patient to skilled nursing facility with palliative care follow-up.  Discussion by problem: 1. GI bleeding with Acute posthemorrhagic anemia. Hb decreased to 8.9, but improved to 10.2  on the day of discharge. Now the patient is on oral Protonix bid, follow-up Hb  intermittently as outpatient. Continue soft diet for the next 1-2 days, then advance it to regular as tolerated, Holding aspirin, restarted per primary care physician's recommendations. Per Dr. Markham Jordan, no  EGD indication this time.   2. UTI with Leukocytosis, urine cultures revealed  multiple species, suggested recollection Patient received antibiotic therapy since 06/07/2016, completed 3 day course, now off antibiotic therapy , remains afebrile  3. AKI,  due to dehydration/infection/GI bleed. Improved with IV fluid hydration  4 right hydronephrosis with hydroureter, mild no indication for acute urologic intervention and Elective outpatient evaluation with Cystoscopy, Right RGP per urologist.  5. Atrial fibrillation with RVR, HR was 160-170, cardizem 20 mg iv one dose, back to NSR, but Afib with RVR again , few days ago, HR 140, given cardizem 10 mg iv one dose, started po lopressor. The patient remains in sinus rhythm, continue low-dose of Lopressor, bradycardia has resolved  6 Elevated troponin, due to demanding ischemia. Echocardiogram revealed severe aortic stenosis, normal ejection fraction, moderately severe tricuspid, and mitral valve regurgitation, elevated pulmonary arterial pressure, no wall motion abnormalities. No further interventions, palliative care follow-up would be recommended due to severe aortic stenosis and projected progressive worsening of congestive heart failure, respiratory status in the future, continue oxygen therapy, continue metoprolol, holding aspirin due to recent GI bleed.  7. Hypertension, improved with amlodipine, Imdur ,  restarted Lasix. Blood pressure is well controlled  8. Pulmonary fibrosis-continue Combivent  9. Osteoporosis-continue vitamin D and calcium  10. Iron deficiency-continue ferrous sulfate  11. Glaucoma-continue eye drops and artificial tears  12. GERD- Protonix orally twice a day  13 Acute respiratory failure with hypoxia and hypercapnia Off HF O2, now on 2 L of oxygen through nasal cannula, appreciate pulmonary input, BIPAP prn, NEB, continue Lasix, may need to be advanced  14. Acute diastolic congestive heart failure, EF 60-70% per echo. Severe aortic stenosis.  continue Lasix, f/u with cardiology  outpatient, no other interventions or evaluations are recommended due to multiple comorbidities, age, functional status.  15. Bilateral moderate to large pleural effusion. Restarted lasix, no thoracentesis per Dr. Sherryll Burger intensivist.  16. Generalized weakness, physical therapist evaluated patient and recommended skilled nursing facility placement,  patient will be discharged to skilled nursing facility with palliative care follow-up. DISCHARGE CONDITIONS:   Stable  CONSULTS OBTAINED:  Treatment Team:  Scot Jun, MD Marin Olp, MD Alwyn Pea, MD  DRUG ALLERGIES:  No Known Allergies  DISCHARGE MEDICATIONS:   Current Discharge Medication List    START taking these medications   Details  metoprolol tartrate (LOPRESSOR) 25 MG tablet Take 0.5 tablets (12.5 mg total) by mouth 2 (two) times daily. Qty: 60 tablet, Refills: 5    pantoprazole (PROTONIX) 40 MG tablet Take 1 tablet (40 mg total) by mouth 2 (two) times daily before a meal. Qty: 60 tablet, Refills: 5      CONTINUE these medications which have NOT CHANGED   Details  bisacodyl (STIMULANT LAXATIVE) 5 MG EC tablet Take 2 tablets by mouth daily.    Calcium Carbonate-Vitamin D (CALCIUM-VITAMIN D) 500-200 MG-UNIT tablet Take 1 tablet by mouth 2 (two) times daily.    Cholecalciferol (VITAMIN D3) 1000 units CAPS Take 1 capsule by mouth daily.    ferrous sulfate 325 (65 FE) MG tablet Take 1 tablet by mouth daily.    furosemide (LASIX) 20 MG tablet Take 1 tablet by mouth daily.    Ipratropium-Albuterol (COMBIVENT) 20-100 MCG/ACT AERS respimat Inhale 1 puff into the lungs 4 (four) times daily.    isosorbide mononitrate (IMDUR) 30 MG 24 hr tablet Take 1 tablet by mouth daily.    latanoprost (XALATAN) 0.005 % ophthalmic solution Apply 1 drop  to eye at bedtime.    polyvinyl alcohol (ARTIFICIAL TEARS) 1.4 % ophthalmic solution Apply 1 drop to eye 4 (four) times daily as needed.    ranitidine  (ZANTAC) 150 MG tablet Take 1 tablet by mouth 2 (two) times daily.    amLODipine (NORVASC) 5 MG tablet Take 1 tablet by mouth daily.      STOP taking these medications     aspirin EC 81 MG tablet          DISCHARGE INSTRUCTIONS:    Patient is to follow-up with primary care physician, cardiology, urology as outpatient  If you experience worsening of your admission symptoms, develop shortness of breath, life threatening emergency, suicidal or homicidal thoughts you must seek medical attention immediately by calling 911 or calling your MD immediately  if symptoms less severe.  You Must read complete instructions/literature along with all the possible adverse reactions/side effects for all the Medicines you take and that have been prescribed to you. Take any new Medicines after you have completely understood and accept all the possible adverse reactions/side effects.   Please note  You were cared for by a hospitalist during your hospital stay. If you have any questions about your discharge medications or the care you received while you were in the hospital after you are discharged, you can call the unit and asked to speak with the hospitalist on call if the hospitalist that took care of you is not available. Once you are discharged, your primary care physician will handle any further medical issues. Please note that NO REFILLS for any discharge medications will be authorized once you are discharged, as it is imperative that you return to your primary care physician (or establish a relationship with a primary care physician if you do not have one) for your aftercare needs so that they can reassess your need for medications and monitor your lab values.    Today   CHIEF COMPLAINT:   Chief Complaint  Patient presents with  . Weakness    HISTORY OF PRESENT ILLNESS:  Kelly Booth  is a 82 y.o. female with a known history of constipation, generalized weakness, glaucoma, hypertension,  pulmonary fibrosis, who presents to the hospital with complaints of loose stools with gastrointestinal bleeding. Patient did have vomiting in the emergency room which was dark red and bloody. Patient was admitted to the hospital for further evaluation and treatment. She was initiated on Protonix intravenously, gastroenterologist consultation was obtained. She was resuscitated with IV fluids. Gastroenterologist felt that patient should be treated medically, no interventions were performed while in the hospital. Aspirin was stopped. Patient's hemoglobin level was checked periodically, she did not need transfusion. Her bleeding stopped and she was restarted on diet, which was advanced to soft diet prior to discharge from the hospital. Hemoglobin level remained stable and it was 10.2 on the day of discharge. While in the hospital being resuscitated with IV fluids, patient developed severe shortness of breath, she was transferred to ICU on 06/07/2016 with respiratory distress and hypoxemia, requiring high flow oxygen. Chest x-ray revealed moderate to large bilateral pleural effusions with associated atelectasis. Patient was seen by intensivist/pulmonologist, who recommended to restart her on Lasix. He also recommended to continue oxygen therapy chronically. Echocardiogram was performed while she was in the hospital, which showed increased wall thickness, consistent with mild CHF, normal ejection fraction of 65-70%, normal wall motions, aortic valve was noted to be calcified, there was a severe stenosis noted and mild regurgitation, mitral valve was also  calcified, moderate to severe aortic regurgitation noted, questionable vegetation versus prolapse and posterior leaflet was noted as well. Patient was consulted by palliative care, patient's case was discussed this patient's family, and they were agreeable to discharge patient to skilled nursing facility with palliative care follow-up.  Discussion by problem: 1. GI  bleeding with Acute posthemorrhagic anemia. Hb decreased to 8.9, but improved to 10.2  on the day of discharge. Now the patient is on oral Protonix bid, follow-up Hb  intermittently as outpatient. Continue soft diet for the next 1-2 days, then advance it to regular as tolerated, Holding aspirin, restarted per primary care physician's recommendations. Per Dr. Markham Jordan, no EGD indication this time.   2. UTI with Leukocytosis, urine cultures revealed multiple species, suggested recollection Patient received antibiotic therapy since 06/07/2016, completed 3 day course, now off antibiotic therapy , remains afebrile  3. AKI,  due to dehydration/infection/GI bleed. Improved with IV fluid hydration  4 right hydronephrosis with hydroureter, mild no indication for acute urologic intervention and Elective outpatient evaluation with Cystoscopy, Right RGP per urologist.  5. Atrial fibrillation with RVR, HR was 160-170, cardizem 20 mg iv one dose, back to NSR, but Afib with RVR again , few days ago, HR 140, given cardizem 10 mg iv one dose, started po lopressor. The patient remains in sinus rhythm, continue low-dose of Lopressor, bradycardia has resolved  6 Elevated troponin, due to demanding ischemia. Echocardiogram revealed severe aortic stenosis, normal ejection fraction, moderately severe tricuspid, and mitral valve regurgitation, elevated pulmonary arterial pressure, no wall motion abnormalities. No further interventions, palliative care follow-up would be recommended due to severe aortic stenosis and projected progressive worsening of congestive heart failure, respiratory status in the future, continue oxygen therapy, continue metoprolol, holding aspirin due to recent GI bleed.  7. Hypertension, improved with amlodipine, Imdur ,  restarted Lasix. Blood pressure is well controlled  8. Pulmonary fibrosis-continue Combivent  9. Osteoporosis-continue vitamin D and calcium  10. Iron  deficiency-continue ferrous sulfate  11. Glaucoma-continue eye drops and artificial tears  12. GERD- Protonix orally twice a day  13 Acute respiratory failure with hypoxia and hypercapnia Off HF O2, now on 2 L of oxygen through nasal cannula, appreciate pulmonary input, BIPAP prn, NEB, continue Lasix, may need to be advanced  14. Acute diastolic congestive heart failure, EF 60-70% per echo. Severe aortic stenosis.  continue Lasix, f/u with cardiology outpatient, no other interventions or evaluations are recommended due to multiple comorbidities, age, functional status.  15. Bilateral moderate to large pleural effusion. Restarted lasix, no thoracentesis per Dr. Sherryll Burger intensivist.  16. Generalized weakness, physical therapist evaluated patient and recommended skilled nursing facility placement,  patient will be discharged to skilled nursing facility with palliative care follow-up.    VITAL SIGNS:  Blood pressure (!) 144/46, pulse 67, temperature 98.4 F (36.9 C), temperature source Oral, resp. rate 20, height 5\' 3"  (1.6 m), weight 41.8 kg (92 lb 1.6 oz), SpO2 97 %.  I/O:   Intake/Output Summary (Last 24 hours) at 06/11/16 0920 Last data filed at 06/11/16 0014  Gross per 24 hour  Intake              360 ml  Output                0 ml  Net              360 ml    PHYSICAL EXAMINATION:  GENERAL:  82 y.o.-year-old patient lying in the bed with no acute  distress.  EYES: Pupils equal, round, reactive to light and accommodation. No scleral icterus. Extraocular muscles intact.  HEENT: Head atraumatic, normocephalic. Oropharynx and nasopharynx clear.  NECK:  Supple, no jugular venous distention. No thyroid enlargement, no tenderness.  LUNGS: Normal breath sounds bilaterally, no wheezing, rales,rhonchi or crepitation. No use of accessory muscles of respiration.  CARDIOVASCULAR: S1, S2 normal. No murmurs, rubs, or gallops.  ABDOMEN: Soft, non-tender, non-distended.  Bowel sounds present. No organomegaly or mass.  EXTREMITIES: No pedal edema, cyanosis, or clubbing.  NEUROLOGIC: Cranial nerves II through XII are intact. Muscle strength 5/5 in all extremities. Sensation intact. Gait not checked.  PSYCHIATRIC: The patient is alert and oriented x 3.  SKIN: No obvious rash, lesion, or ulcer.   DATA REVIEW:   CBC  Recent Labs Lab 06/10/16 0515 06/11/16 0425  WBC 9.4  --   HGB 10.5* 10.2*  HCT 29.8*  --   PLT 188  --     Chemistries   Recent Labs Lab 06/09/16 0602  06/11/16 0424  NA 140  < > 139  K 2.8*  < > 3.9  CL 94*  < > 98*  CO2 37*  < > 34*  GLUCOSE 96  < > 93  BUN 22*  < > 24*  CREATININE 0.84  < > 0.70  CALCIUM 7.6*  < > 8.3*  MG 1.9  --   --   < > = values in this interval not displayed.  Cardiac Enzymes  Recent Labs Lab 06/08/16 0503  TROPONINI 0.52*    Microbiology Results  Results for orders placed or performed during the hospital encounter of 06/05/16  Urine culture     Status: Abnormal   Collection Time: 06/07/16  2:00 AM  Result Value Ref Range Status   Specimen Description URINE, CLEAN CATCH  Final   Special Requests NONE  Final   Culture MULTIPLE SPECIES PRESENT, SUGGEST RECOLLECTION (A)  Final   Report Status 06/08/2016 FINAL  Final  MRSA PCR Screening     Status: None   Collection Time: 06/07/16  9:00 AM  Result Value Ref Range Status   MRSA by PCR NEGATIVE NEGATIVE Final    Comment:        The GeneXpert MRSA Assay (FDA approved for NASAL specimens only), is one component of a comprehensive MRSA colonization surveillance program. It is not intended to diagnose MRSA infection nor to guide or monitor treatment for MRSA infections.     RADIOLOGY:  Dg Chest 1 View  Result Date: 06/09/2016 CLINICAL DATA:  Shortness of breath. EXAM: CHEST 1 VIEW COMPARISON:  06/07/2016 .  08/21/2014. FINDINGS: Cardiomegaly with pulmonary vascular prominence and bilateral interstitial prominence with bilateral pleural  effusions consistent congestive heart failure. No acute bony abnormality identified. Multiple left rib fractures again noted. IMPRESSION: Congestive heart failure with bilateral pulmonary edema and bilateral pleural effusions. Electronically Signed   By: Maisie Fus  Register   On: 06/09/2016 10:32    EKG:   Orders placed or performed during the hospital encounter of 06/05/16  . ED EKG  . ED EKG  . EKG 12-Lead  . EKG 12-Lead      Management plans discussed with the patient, family and they are in agreement.  CODE STATUS:     Code Status Orders        Start     Ordered   06/05/16 2346  Do not attempt resuscitation (DNR)  Continuous    Question Answer Comment  In the event of  cardiac or respiratory ARREST Do not call a "code blue"   In the event of cardiac or respiratory ARREST Do not perform Intubation, CPR, defibrillation or ACLS   In the event of cardiac or respiratory ARREST Use medication by any route, position, wound care, and other measures to relive pain and suffering. May use oxygen, suction and manual treatment of airway obstruction as needed for comfort.      06/05/16 2345    Code Status History    Date Active Date Inactive Code Status Order ID Comments User Context   06/05/2016 11:45 PM 06/07/2016  9:38 AM DNR [id number]  Tonye RoyaltyAlexis Hugelmeyer, DO Inpatient   06/05/2016 11:17 PM 06/05/2016 11:45 PM Full Code [id number]  Tonye RoyaltyAlexis Hugelmeyer, DO Inpatient   12/05/2015  1:08 PM 12/07/2015  4:33 PM DNR [id number]  Milagros LollSrikar Sudini, MD ED    Questions for Most Recent Historical Code Status (Order [id number])    Question Answer Comment   In the event of cardiac or respiratory ARREST Do not call a "code blue"    In the event of cardiac or respiratory ARREST Do not perform Intubation, CPR, defibrillation or ACLS    In the event of cardiac or respiratory ARREST Use medication by any route, position, wound care, and other measures to relive pain and suffering. May use oxygen, suction and manual treatment  of airway obstruction as needed for comfort.         Advance Directive Documentation   Flowsheet Row Most Recent Value  Type of Advance Directive  Healthcare Power of [place], Living will [Ronald Gallacher]  Pre-existing out of facility DNR order (yellow form or pink MOST form)  No data  "MOST" Form in Place?  No data      TOTAL TIME TAKING CARE OF THIS PATIENT: 40 minutes.    Katharina CaperVAICKUTE,Nyiesha Beever M.D on 06/11/2016 at 9:20 AM  Between 7am to 6pm - Pager - [phone]  After 6pm go to [email or web address] - password [email or web address]  [facility]  Office  [phone]  CC: Primary care physician; No primary care provider on file.

## 2016-06-11 NOTE — Clinical Social Work Note (Signed)
Clinical Social Work Assessment  Patient Details  Name: Kelly Booth MRN: [id number] Date of Birth: 05/29/1920  Date of referral:  06/11/16               Reason for consult:  Facility Placement                Permission sought to share information with:    Permission granted to share information::     Name::        Agency::     Relationship::     Contact Information:     Housing/Transportation Living arrangements for the past 2 months:  Single Family Home Source of Information:  Adult Children Patient Interpreter Needed:  None Criminal Activity/Legal Involvement Pertinent to Current Situation/Hospitalization:  No - Comment as needed Significant Relationships:  Adult Children Lives with:  Self Do you feel safe going back to the place where you live?    Need for family participation in patient care:  Yes (Comment)  Care giving concerns:  [facility].    Social Worker assessment / plan: Late entry: Assessment conducted yesterday afternoon by CSW : PT has assessed patient and recommending STR. CSW spoke with patient's son via phone and he is in agreement with STR and [facility]. CSW contacted [facility] and [facility] can accept patient for rehab.    As of today 9/7, MD to discharge patient to [facility].  Employment status:    Insurance information:  Medicare PT Recommendations:  Skilled Nursing Facility Information / Referral to community resources:  Skilled Nursing Facility  Patient/Family's Response to care:  Patient's son very Adult nurseappreciative of CSW assistance.  Patient/Family's Understanding of and Emotional Response to Diagnosis, Current Treatment, and Prognosis:  Patient's son is hopeful that patient will improve and be able to return eventually to her independent apart[facility].  Emotional Assessment Appearance:  Appears stated age Attitude/Demeanor/Rapport:   (pleasant and cooperative) Affect  (typically observed):  Accepting, Adaptable Orientation:  Oriented to Self, Oriented to Place Alcohol / Substance use:  Not Applicable Psych involvement (Current and /or in the community):  No (Comment)  Discharge Needs  Concerns to be addressed:  Care Coordination Readmission within the last 30 days:  No Current discharge risk:  None Barriers to Discharge:  No Barriers Identified   Kelly SpanielMonica Marchetta Navratil, LCSW 06/11/2016, 10:50 AM

## 2016-06-11 NOTE — Progress Notes (Signed)
IV was removed. Pt will be discharged [facility]. Report given to Nell RangeShannah Kirkman, RN. [facility] will [facility].

## 2016-06-11 NOTE — Progress Notes (Signed)
Spoke with Dr Sheryle Hailiamond - received permission to take BP in arm

## 2016-08-16 ENCOUNTER — Encounter: Payer: Self-pay | Admitting: Emergency Medicine

## 2016-08-16 ENCOUNTER — Emergency Department: Payer: Medicare Other

## 2016-08-16 ENCOUNTER — Emergency Department
Admission: EM | Admit: 2016-08-16 | Discharge: 2016-08-17 | Disposition: A | Discharge status: Home / Self Care | Payer: Medicare Other | Attending: Emergency Medicine | Admitting: Emergency Medicine

## 2016-08-16 DIAGNOSIS — W1809XA Striking against other object with subsequent fall, initial encounter: Secondary | ICD-10-CM | POA: Insufficient documentation

## 2016-08-16 DIAGNOSIS — Y999 Unspecified external cause status: Secondary | ICD-10-CM | POA: Insufficient documentation

## 2016-08-16 DIAGNOSIS — I5031 Acute diastolic (congestive) heart failure: Secondary | ICD-10-CM | POA: Diagnosis not present

## 2016-08-16 DIAGNOSIS — S4992XA Unspecified injury of left shoulder and upper arm, initial encounter: Secondary | ICD-10-CM | POA: Diagnosis present

## 2016-08-16 DIAGNOSIS — W19XXXA Unspecified fall, initial encounter: Secondary | ICD-10-CM

## 2016-08-16 DIAGNOSIS — I110 Hypertensive heart disease with heart failure: Secondary | ICD-10-CM | POA: Diagnosis not present

## 2016-08-16 DIAGNOSIS — Y9389 Activity, other specified: Secondary | ICD-10-CM | POA: Insufficient documentation

## 2016-08-16 DIAGNOSIS — Z79899 Other long term (current) drug therapy: Secondary | ICD-10-CM | POA: Diagnosis not present

## 2016-08-16 DIAGNOSIS — E860 Dehydration: Secondary | ICD-10-CM | POA: Insufficient documentation

## 2016-08-16 DIAGNOSIS — Y929 Unspecified place or not applicable: Secondary | ICD-10-CM | POA: Insufficient documentation

## 2016-08-16 DIAGNOSIS — G9389 Other specified disorders of brain: Secondary | ICD-10-CM | POA: Insufficient documentation

## 2016-08-16 DIAGNOSIS — S42025A Nondisplaced fracture of shaft of left clavicle, initial encounter for closed fracture: Secondary | ICD-10-CM | POA: Diagnosis not present

## 2016-08-16 DIAGNOSIS — S42025B Nondisplaced fracture of shaft of left clavicle, initial encounter for open fracture: Secondary | ICD-10-CM

## 2016-08-16 LAB — URINALYSIS COMPLETE WITH MICROSCOPIC (ARMC ONLY)
Bilirubin Urine: NEGATIVE
GLUCOSE, UA: NEGATIVE mg/dL
Hgb urine dipstick: NEGATIVE
Ketones, ur: NEGATIVE mg/dL
Leukocytes, UA: NEGATIVE
Nitrite: NEGATIVE
Protein, ur: NEGATIVE mg/dL
Specific Gravity, Urine: 1.002 — ABNORMAL LOW (ref 1.005–1.030)
pH: 7 (ref 5.0–8.0)

## 2016-08-16 LAB — BASIC METABOLIC PANEL
Anion gap: 7 (ref 5–15)
BUN: 26 mg/dL — ABNORMAL HIGH (ref 6–20)
CALCIUM: 9.1 mg/dL (ref 8.9–10.3)
CO2: 34 mmol/L — AB (ref 22–32)
CREATININE: 1.03 mg/dL — AB (ref 0.44–1.00)
Chloride: 96 mmol/L — ABNORMAL LOW (ref 101–111)
GFR calc non Af Amer: 45 mL/min — ABNORMAL LOW (ref >60)
GFR, EST AFRICAN AMERICAN: 52 mL/min — AB (ref >60)
GLUCOSE: 122 mg/dL — AB (ref 65–99)
Potassium: 3.1 mmol/L — ABNORMAL LOW (ref 3.5–5.1)
Sodium: 137 mmol/L (ref 135–145)

## 2016-08-16 LAB — TROPONIN I: Troponin I: <0.03 ng/mL (ref <0.03)

## 2016-08-16 LAB — CBC
HEMATOCRIT: 31.7 % — AB (ref 35.0–47.0)
Hemoglobin: 10.8 g/dL — ABNORMAL LOW (ref 12.0–16.0)
MCH: 32.5 pg (ref 26.0–34.0)
MCHC: 33.9 g/dL (ref 32.0–36.0)
MCV: 95.8 fL (ref 80.0–100.0)
Platelets: 272 10*3/uL (ref 150–440)
RBC: 3.31 MIL/uL — ABNORMAL LOW (ref 3.80–5.20)
RDW: 12.8 % (ref 11.5–14.5)
WBC: 8.5 10*3/uL (ref 3.6–11.0)

## 2016-08-16 MED ORDER — ACETAMINOPHEN 500 MG PO TABS
1000.0000 mg | ORAL_TABLET | Freq: Once | ORAL | Status: DC
  Filled 2016-08-16: qty 2

## 2016-08-16 MED ORDER — CEFAZOLIN IN D5W 1 GM/50ML IV SOLN
1.0000 g | Freq: Once | INTRAVENOUS | Status: AC
  Administered 2016-08-16: 1 g via INTRAVENOUS
  Filled 2016-08-16: qty 50

## 2016-08-16 MED ORDER — SODIUM CHLORIDE 0.9 % IV BOLUS (SEPSIS)
1000.0000 mL | Freq: Once | INTRAVENOUS | Status: AC
  Administered 2016-08-16: 1000 mL via INTRAVENOUS

## 2016-08-16 MED ORDER — CEPHALEXIN 500 MG PO CAPS: 500.0000 mg | ORAL_CAPSULE | Freq: Four times a day (QID) | ORAL | 0 refills | Status: AC

## 2016-08-16 MED ORDER — BACITRACIN ZINC 500 UNIT/GM EX OINT
TOPICAL_OINTMENT | Freq: Two times a day (BID) | CUTANEOUS | Status: DC
  Administered 2016-08-16: 1 via TOPICAL

## 2016-08-16 MED ORDER — BACITRACIN ZINC 500 UNIT/GM EX OINT
TOPICAL_OINTMENT | CUTANEOUS | Status: AC
  Administered 2016-08-16: 1 via TOPICAL
  Filled 2016-08-16: qty 0.9

## 2016-08-16 NOTE — Discharge Instructions (Signed)
Keep the clavicle dressing dry and clean until you see Dr. Joice LoftsPoggi. Call his office in the morning for a close follow up appointment this Thursday or Friday. Take the antibiotics as prescribed. Keep your shoulder immobilized until you are cleared by Dr. Joice LoftsPoggi. Follow up with her doctor in 2 days. Return to the emergency room if you have fever, redness over the site of your clavicle fracture, pus, or any other symptoms concerning to you.

## 2016-08-16 NOTE — ED Provider Notes (Signed)
[facility] Emergency Department Provider Note  ____________________________________________  Time seen: Approximately 8:53 PM  I have reviewed the triage vital signs and the nursing notes.   HISTORY  Chief Complaint Fall   HPI Kelly Booth is a 82 y.o. female a history of osteoporosis, atrial fibrillation, hypertension, and pulmonary fibrosis who presents for evaluation of a fall. Patient reports that she was leaning forward to pick up a piece of paper on the ground when she lost her balance and fell. She hit her left shoulder on the ground. She denies LOC. She was able to ambulate after the fall with her walker. Later this evening her family was visiting her and noticed a bruise in her left shoulder which prompted the visit to the emergency room. Patient tells me that she did not have any headache, chest pain, palpitations, shortness of breath, back pain both preceding or after the fall. She reports mild pain in her left shoulder that is worse with movement, constant since the fall, non radiating. Son is at the bedside and reports that patient has no history of dementia, lives in an independent living facility. He reports that two days ago patient was complaining of feeling tired however she was doing great today. He would like to make sure patient is not dehydrated or has a urinary tract infection.  Past Medical History:  Diagnosis Date  . A-fib (HCC)   . Allergic rhinitis   . Constipation   . Dyspepsia   . Esophageal reflux   . Generalized weakness   . Glaucoma   . Hypertension   . Osteoporosis   . Pulmonary fibrosis (HCC)    There is no evidence of pulmonary fibrosis on CT chest    Patient Active Problem List   Diagnosis Date Noted  . Acute respiratory failure with hypoxia and hypercapnia (HCC) 06/11/2016  . Acute diastolic CHF (congestive heart failure) (HCC) 06/11/2016  . Bilateral pleural effusion 06/11/2016  . Severe aortic stenosis  06/11/2016  . Acute posthemorrhagic anemia 06/11/2016  . Acute renal insufficiency 06/11/2016  . A. fib, RVR 06/11/2016  . Atrial fibrillation with RVR (HCC) 06/11/2016  . Elevated troponin 06/11/2016  . UTI (urinary tract infection) 06/11/2016  . DNR (do not resuscitate) 06/09/2016  . Palliative care by specialist 06/09/2016  . Weakness generalized 06/09/2016  . Dyspnea   . Bleeding gastrointestinal 06/05/2016  . Malnutrition of moderate degree 12/06/2015  . Bradycardia 12/05/2015    Past Surgical History:  Procedure Laterality Date  . ABDOMINAL HYSTERECTOMY    . TOTAL HIP ARTHROPLASTY      Prior to Admission medications   Medication Sig Start Date End Date Taking? Authorizing Provider  amLODipine (NORVASC) 5 MG tablet Take 1 tablet by mouth daily. 09/18/15   Historical Provider, MD  bisacodyl (STIMULANT LAXATIVE) 5 MG EC tablet Take 2 tablets by mouth daily. 09/18/15   Historical Provider, MD  Calcium Carbonate-Vitamin D (CALCIUM-VITAMIN D) 500-200 MG-UNIT tablet Take 1 tablet by mouth 2 (two) times daily. 09/18/15   Historical Provider, MD  cephALEXin (KEFLEX) 500 MG capsule Take 1 capsule (500 mg total) by mouth 4 (four) times daily. 08/16/16 08/21/16  Nita Sicklearolina Demond Shallenberger, MD  Cholecalciferol (VITAMIN D3) 1000 units CAPS Take 1 capsule by mouth daily. 09/18/15   Historical Provider, MD  ferrous sulfate 325 (65 FE) MG tablet Take 1 tablet by mouth daily. 09/18/15   Historical Provider, MD  furosemide (LASIX) 20 MG tablet Take 1 tablet by mouth daily. 09/18/15  Historical Provider, MD  Ipratropium-Albuterol (COMBIVENT) 20-100 MCG/ACT AERS respimat Inhale 1 puff into the lungs 4 (four) times daily. 09/18/15   Historical Provider, MD  isosorbide mononitrate (IMDUR) 30 MG 24 hr tablet Take 1 tablet by mouth daily. 09/18/15   Historical Provider, MD  latanoprost (XALATAN) 0.005 % ophthalmic solution Apply 1 drop to eye at bedtime. 09/18/15   Historical Provider, MD  metoprolol tartrate  (LOPRESSOR) 25 MG tablet Take 0.5 tablets (12.5 mg total) by mouth 2 (two) times daily. 06/11/16   Katharina Caper, MD  pantoprazole (PROTONIX) 40 MG tablet Take 1 tablet (40 mg total) by mouth 2 (two) times daily before a meal. 06/11/16   Katharina Caper, MD  polyvinyl alcohol (ARTIFICIAL TEARS) 1.4 % ophthalmic solution Apply 1 drop to eye 4 (four) times daily as needed. 09/18/15   Historical Provider, MD  ranitidine (ZANTAC) 150 MG tablet Take 1 tablet by mouth 2 (two) times daily. 09/18/15   Historical Provider, MD    Allergies Patient has no known allergies.  Family History  Problem Relation Age of Onset  . Hypertension Other     Social History Social History  Substance Use Topics  . Smoking status: Never Smoker  . Smokeless tobacco: Never Used  . Alcohol use No    Review of Systems  Constitutional: Negative for fever. Eyes: Negative for visual changes. ENT: Negative for sore throat. Cardiovascular: Negative for chest pain. Respiratory: Negative for shortness of breath. Gastrointestinal: Negative for abdominal pain, vomiting or diarrhea. Genitourinary: Negative for dysuria. Musculoskeletal: Negative for back pain. + left shoulder pain Skin: Negative for rash. Neurological: Negative for headaches, weakness or numbness.  ____________________________________________   PHYSICAL EXAM:  VITAL SIGNS: ED Triage Vitals  Enc Vitals Group     BP 08/16/16 2030 (!) 160/54     Pulse Rate 08/16/16 2030 71     Resp 08/16/16 2030 17     Temp 08/16/16 2030 97.6 F (36.4 C)     Temp src --      SpO2 08/16/16 2030 98 %     Weight 08/16/16 2031 93 lb (42.2 kg)     Height 08/16/16 2031 5\' 5"  (1.651 m)     Head Circumference --      Peak Flow --      Pain Score 08/16/16 2031 2     Pain Loc --      Pain Edu? --      Excl. in GC? --     Constitutional: Alert and oriented x 2. No acute distress. Does not appear intoxicated. HEENT Head: Normocephalic and atraumatic. Face: No facial  bony tenderness. Stable midface Ears: No hemotympanum bilaterally. No Battle sign Eyes: No eye injury. PERRL. No raccoon eyes Nose: Nontender. No epistaxis. No rhinorrhea Mouth/Throat: Mucous membranes are moist. No oropharyngeal blood. No dental injury. Airway patent without stridor. Normal voice. Neck: no C-collar in place. No midline c-spine tenderness.  Cardiovascular: Normal rate, regular rhythm. Normal and symmetric distal pulses are present in all extremities. Pulmonary/Chest: Chest wall is stable and nontender to palpation/compression. Normal respiratory effort. Breath sounds are normal. No crepitus.  Abdominal: Soft, nontender, non distended. Musculoskeletal: Swelling and bruising noted on the anterior aspect of L shoulder, deformity of the left clavicle with tenting of the skin and small skin puncture. Nontender with normal full range of motion in all other extremities. No deformities. No thoracic or lumbar midline spinal tenderness. Pelvis is stable. Skin: Skin is warm, dry and intact. No abrasions or contutions.  Psychiatric: Speech and behavior are appropriate. Neurological: Normal speech and language. Moves all extremities to command. No gross focal neurologic deficits are appreciated.  Glascow Coma Score: 4 - Opens eyes on own 6 - Follows simple motor commands 5 - Alert and oriented GCS: 15  ____________________________________________   LABS (all labs ordered are listed, but only abnormal results are displayed)  Labs Reviewed  CBC - Abnormal; Notable for the following:       Result Value   RBC 3.31 (*)    Hemoglobin 10.8 (*)    HCT 31.7 (*)    All other components within normal limits  BASIC METABOLIC PANEL - Abnormal; Notable for the following:    Potassium 3.1 (*)    Chloride 96 (*)    CO2 34 (*)    Glucose, Bld 122 (*)    BUN 26 (*)    Creatinine, Ser 1.03 (*)    GFR calc non Af Amer 45 (*)    GFR calc Af Amer 52 (*)    All other components within normal  limits  URINALYSIS COMPLETEWITH MICROSCOPIC ([facility] ONLY) - Abnormal; Notable for the following:    Color, Urine STRAW (*)    APPearance CLEAR (*)    Specific Gravity, Urine 1.002 (*)    Bacteria, UA RARE (*)    Squamous Epithelial / LPF 0-5 (*)    All other components within normal limits  URINE CULTURE  TROPONIN I  BASIC METABOLIC PANEL   ____________________________________________  EKG  ED ECG REPORT I, Nita Sickle, the attending physician, personally viewed and interpreted this ECG.  Normal sinus rhythm, rate of 72, normal intervals, normal axis, no ST elevations or depressions, T-wave on V1 through V3. Unchanged from prior ____________________________________________  RADIOLOGY  XR Left shoulder: Acute nondisplaced fracture of the distal left clavicle. Remote healed fracture deformities of the left clavicle midshaft and multiple left ribs.  CT head: Negative ____________________________________________   PROCEDURES  Procedure(s) performed: None Procedures Critical Care performed:  None ____________________________________________   INITIAL IMPRESSION / ASSESSMENT AND PLAN / ED COURSE  82 y.o. female a history of osteoporosis, atrial fibrillation, hypertension, and pulmonary fibrosis who presents for evaluation of a fall and pain on the L shoulder. The son would like for patient to be evaluated for possible dehydration or urinary tract infection as 2 days ago she was complaining of weakness. Patient has had a great day today with no weakness. Her vital signs are within normal limits, her physical exam showed bruising and tenderness to palpation of the left shoulder, deformity of the left clavicle with skin tenting and small puncture wound, no other findings. She is neurologically intact. No signs or symptoms of basilar skull fracture. No CT and L-spine tenderness. No indication for cervical spine imaging based on nexus criteria. We'll get CT head, x-ray of her  shoulder, basic labs including urinalysis.  Clinical Course     CT head negative. XR of the L shoulder showing acute clavicular fracture, patient does have tenting of the skin and small puncture hole where the bone touches the skin, no bone exposed. Discussed finding with Dr. Joice Lofts, orthopedics on call who looked at patient's XR and recommended medical management with ancef, 5 days of keflex, clean the wound, dressing, shoulder immobilizer, and f/u in the clinic on Thursday or Friday this week. Son is at the bedside and was updated of the results. He was given resource list for home help.  Blood work concerning for mild AKI. Will give IVF  and repeat creatinine. If improving will dc home. Care transferred to Dr. Zenda AlpersWebster.    Pertinent labs & imaging results that were available during my care of the patient were reviewed by me and considered in my medical decision making (see chart for details).    ____________________________________________   FINAL CLINICAL IMPRESSION(S) / ED DIAGNOSES  Final diagnoses:  Fall, initial encounter  Open nondisplaced fracture of shaft of left clavicle, initial encounter  Dehydration      NEW MEDICATIONS STARTED DURING THIS VISIT:  New Prescriptions   CEPHALEXIN (KEFLEX) 500 MG CAPSULE    Take 1 capsule (500 mg total) by mouth 4 (four) times daily.     Note:  This document was prepared using Dragon voice recognition software and may include unintentional dictation errors.    Nita Sicklearolina Kamren Heskett, MD 08/16/16 [phone]

## 2016-08-16 NOTE — ED Triage Notes (Signed)
Per [facility], patient comes from [facility]. Patient fell at 4pm. When family got there they noticed bruising and swelling to left shoulder. Patient also c/o left knee pain. Patient has a hx of a previous collar bone injury "years ago". Patient denies LOC or hitting head. Patient denies taking any blood thinners.

## 2016-08-16 NOTE — ED Notes (Signed)
Patient transported to radiology

## 2016-08-17 DIAGNOSIS — S42025A Nondisplaced fracture of shaft of left clavicle, initial encounter for closed fracture: Secondary | ICD-10-CM | POA: Diagnosis not present

## 2016-08-17 LAB — BASIC METABOLIC PANEL
ANION GAP: 7 (ref 5–15)
BUN: 22 mg/dL — ABNORMAL HIGH (ref 6–20)
CHLORIDE: 102 mmol/L (ref 101–111)
CO2: 32 mmol/L (ref 22–32)
Calcium: 8.5 mg/dL — ABNORMAL LOW (ref 8.9–10.3)
Creatinine, Ser: 0.88 mg/dL (ref 0.44–1.00)
GFR calc Af Amer: >60 mL/min (ref >60)
GFR, EST NON AFRICAN AMERICAN: 55 mL/min — AB (ref >60)
Glucose, Bld: 85 mg/dL (ref 65–99)
POTASSIUM: 2.9 mmol/L — AB (ref 3.5–5.1)
SODIUM: 141 mmol/L (ref 135–145)

## 2016-08-17 NOTE — ED Provider Notes (Signed)
-----------------------------------------   1:59 AM on 08/17/2016 -----------------------------------------   Blood pressure (!) 151/62, pulse 68, temperature 97.6 F (36.4 C), resp. rate 18, height 5\' 5"  (1.651 m), weight 93 lb (42.2 kg), SpO2 96 %.  Assuming care from Dr. Darden DatesVaronese.  In short, Kelly Booth is a 82 y.o. female with a chief complaint of Fall .  Refer to the original H&P for additional details.  The current plan of care is to follow up the results of the repeat BMP.  I repeated the patient's BMP and her creatinine and GFR are improved after some hydration. The patient will be discharged home to follow-up with her primary care physician and orthopedic surgery    Rebecka ApleyAllison P Markeith Jue, MD 08/17/16 0200

## 2016-08-17 NOTE — ED Notes (Signed)
Patient's son says he needs EMS to take patient home.  She lives in [facility] and he will follow them there to let them into her place.  Putting in Medical Necessity and Secretary made aware of transport needs.

## 2016-08-17 NOTE — ED Notes (Signed)
Pt's bed changed and changed pt into new brief and new scrub pants. Son took pt's clothes with him. No other concerns voiced at this time. Raynelle FanningJulie, EDT assisted with this.

## 2016-08-19 LAB — URINE CULTURE

## 2017-10-12 ENCOUNTER — Other Ambulatory Visit: Payer: Self-pay

## 2017-10-12 ENCOUNTER — Inpatient Hospital Stay
Admission: EM | Admit: 2017-10-12 | Discharge: 2017-10-16 | DRG: 189 | Disposition: A | Discharge status: Skilled Nursing Facility | Payer: Medicare Other | Attending: Specialist | Admitting: Specialist

## 2017-10-12 ENCOUNTER — Emergency Department: Payer: Medicare Other

## 2017-10-12 ENCOUNTER — Encounter: Payer: Self-pay | Admitting: Emergency Medicine

## 2017-10-12 DIAGNOSIS — I5032 Chronic diastolic (congestive) heart failure: Secondary | ICD-10-CM | POA: Diagnosis present

## 2017-10-12 DIAGNOSIS — I4891 Unspecified atrial fibrillation: Secondary | ICD-10-CM | POA: Diagnosis present

## 2017-10-12 DIAGNOSIS — J9601 Acute respiratory failure with hypoxia: Secondary | ICD-10-CM | POA: Diagnosis not present

## 2017-10-12 DIAGNOSIS — M545 Low back pain, unspecified: Secondary | ICD-10-CM

## 2017-10-12 DIAGNOSIS — W010XXA Fall on same level from slipping, tripping and stumbling without subsequent striking against object, initial encounter: Secondary | ICD-10-CM | POA: Diagnosis present

## 2017-10-12 DIAGNOSIS — Z66 Do not resuscitate: Secondary | ICD-10-CM | POA: Diagnosis present

## 2017-10-12 DIAGNOSIS — M810 Age-related osteoporosis without current pathological fracture: Secondary | ICD-10-CM | POA: Diagnosis present

## 2017-10-12 DIAGNOSIS — Z681 Body mass index (BMI) 19 or less, adult: Secondary | ICD-10-CM

## 2017-10-12 DIAGNOSIS — Z881 Allergy status to other antibiotic agents status: Secondary | ICD-10-CM

## 2017-10-12 DIAGNOSIS — S22000A Wedge compression fracture of unspecified thoracic vertebra, initial encounter for closed fracture: Secondary | ICD-10-CM | POA: Diagnosis present

## 2017-10-12 DIAGNOSIS — J9621 Acute and chronic respiratory failure with hypoxia: Principal | ICD-10-CM | POA: Diagnosis present

## 2017-10-12 DIAGNOSIS — I110 Hypertensive heart disease with heart failure: Secondary | ICD-10-CM | POA: Diagnosis present

## 2017-10-12 DIAGNOSIS — J449 Chronic obstructive pulmonary disease, unspecified: Secondary | ICD-10-CM | POA: Diagnosis present

## 2017-10-12 DIAGNOSIS — R64 Cachexia: Secondary | ICD-10-CM | POA: Diagnosis present

## 2017-10-12 DIAGNOSIS — S22088A Other fracture of T11-T12 vertebra, initial encounter for closed fracture: Secondary | ICD-10-CM | POA: Diagnosis present

## 2017-10-12 DIAGNOSIS — Z96649 Presence of unspecified artificial hip joint: Secondary | ICD-10-CM | POA: Diagnosis present

## 2017-10-12 DIAGNOSIS — W19XXXA Unspecified fall, initial encounter: Secondary | ICD-10-CM

## 2017-10-12 DIAGNOSIS — H409 Unspecified glaucoma: Secondary | ICD-10-CM | POA: Diagnosis present

## 2017-10-12 DIAGNOSIS — K219 Gastro-esophageal reflux disease without esophagitis: Secondary | ICD-10-CM | POA: Diagnosis present

## 2017-10-12 DIAGNOSIS — J8410 Pulmonary fibrosis, unspecified: Secondary | ICD-10-CM | POA: Diagnosis present

## 2017-10-12 DIAGNOSIS — H9190 Unspecified hearing loss, unspecified ear: Secondary | ICD-10-CM | POA: Diagnosis present

## 2017-10-12 LAB — CBC WITH DIFFERENTIAL/PLATELET
BASOS ABS: 0 10*3/uL (ref 0–0.1)
BASOS PCT: 0 %
Eosinophils Absolute: 0.1 10*3/uL (ref 0–0.7)
Eosinophils Relative: 1 %
HCT: 33.6 % — ABNORMAL LOW (ref 35.0–47.0)
Hemoglobin: 11.8 g/dL — ABNORMAL LOW (ref 12.0–16.0)
LYMPHS PCT: 13 %
Lymphs Abs: 1.4 10*3/uL (ref 1.0–3.6)
MCH: 35.1 pg — ABNORMAL HIGH (ref 26.0–34.0)
MCHC: 35 g/dL (ref 32.0–36.0)
MCV: 100.4 fL — AB (ref 80.0–100.0)
MONO ABS: 0.8 10*3/uL (ref 0.2–0.9)
Monocytes Relative: 8 %
Neutro Abs: 8 10*3/uL — ABNORMAL HIGH (ref 1.4–6.5)
Neutrophils Relative %: 78 %
Platelets: 209 10*3/uL (ref 150–440)
RBC: 3.35 MIL/uL — AB (ref 3.80–5.20)
RDW: 13.5 % (ref 11.5–14.5)
WBC: 10.4 10*3/uL (ref 3.6–11.0)

## 2017-10-12 LAB — COMPREHENSIVE METABOLIC PANEL
ALT: 15 U/L (ref 14–54)
AST: 26 U/L (ref 15–41)
Albumin: 3.7 g/dL (ref 3.5–5.0)
Alkaline Phosphatase: 65 U/L (ref 38–126)
Anion gap: 9 (ref 5–15)
BILIRUBIN TOTAL: 0.5 mg/dL (ref 0.3–1.2)
BUN: 29 mg/dL — AB (ref 6–20)
CHLORIDE: 99 mmol/L — AB (ref 101–111)
CO2: 32 mmol/L (ref 22–32)
Calcium: 9.1 mg/dL (ref 8.9–10.3)
Creatinine, Ser: 1.21 mg/dL — ABNORMAL HIGH (ref 0.44–1.00)
GFR, EST AFRICAN AMERICAN: 43 mL/min — AB (ref >60)
GFR, EST NON AFRICAN AMERICAN: 37 mL/min — AB (ref >60)
Glucose, Bld: 136 mg/dL — ABNORMAL HIGH (ref 65–99)
POTASSIUM: 3.8 mmol/L (ref 3.5–5.1)
Sodium: 140 mmol/L (ref 135–145)
TOTAL PROTEIN: 7.2 g/dL (ref 6.5–8.1)

## 2017-10-12 LAB — TROPONIN I

## 2017-10-12 MED ORDER — ENOXAPARIN SODIUM 40 MG/0.4ML ~~LOC~~ SOLN: 40.0000 mg | SUBCUTANEOUS | Status: DC

## 2017-10-12 MED ORDER — ACETAMINOPHEN 325 MG PO TABS
650.0000 mg | ORAL_TABLET | Freq: Four times a day (QID) | ORAL | Status: DC | PRN
  Administered 2017-10-14: 650 mg via ORAL
  Filled 2017-10-12: qty 2

## 2017-10-12 MED ORDER — CALCIUM CARBONATE-VITAMIN D 500-200 MG-UNIT PO TABS
1.0000 | ORAL_TABLET | Freq: Two times a day (BID) | ORAL | Status: DC
  Administered 2017-10-12 – 2017-10-16 (×6): 1 via ORAL
  Filled 2017-10-12 (×7): qty 1

## 2017-10-12 MED ORDER — TRAMADOL HCL 50 MG PO TABS: 50.0000 mg | ORAL_TABLET | Freq: Four times a day (QID) | ORAL | Status: DC | PRN

## 2017-10-12 MED ORDER — ALBUTEROL SULFATE (2.5 MG/3ML) 0.083% IN NEBU: 2.5000 mg | INHALATION_SOLUTION | Freq: Four times a day (QID) | RESPIRATORY_TRACT | Status: DC | PRN

## 2017-10-12 MED ORDER — PANTOPRAZOLE SODIUM 40 MG PO TBEC
40.0000 mg | DELAYED_RELEASE_TABLET | Freq: Two times a day (BID) | ORAL | Status: DC
  Administered 2017-10-13 – 2017-10-16 (×6): 40 mg via ORAL
  Filled 2017-10-12 (×7): qty 1

## 2017-10-12 MED ORDER — MORPHINE SULFATE (PF) 2 MG/ML IV SOLN
1.0000 mg | INTRAVENOUS | Status: DC | PRN
  Administered 2017-10-13: 1 mg via INTRAVENOUS
  Filled 2017-10-12: qty 1

## 2017-10-12 MED ORDER — FAMOTIDINE 20 MG PO TABS
20.0000 mg | ORAL_TABLET | Freq: Every day | ORAL | Status: DC
  Administered 2017-10-14 – 2017-10-16 (×3): 20 mg via ORAL
  Filled 2017-10-12 (×3): qty 1

## 2017-10-12 MED ORDER — FERROUS SULFATE 325 (65 FE) MG PO TABS
325.0000 mg | ORAL_TABLET | Freq: Every day | ORAL | Status: DC
  Administered 2017-10-14 – 2017-10-16 (×3): 325 mg via ORAL
  Filled 2017-10-12 (×3): qty 1

## 2017-10-12 MED ORDER — OXYCODONE HCL 5 MG PO TABS: 5.0000 mg | ORAL_TABLET | ORAL | Status: DC | PRN

## 2017-10-12 MED ORDER — LATANOPROST 0.005 % OP SOLN
1.0000 [drp] | Freq: Every day | OPHTHALMIC | Status: DC
  Administered 2017-10-12 – 2017-10-15 (×4): 1 [drp] via OPHTHALMIC
  Filled 2017-10-12: qty 2.5

## 2017-10-12 MED ORDER — METOPROLOL TARTRATE 5 MG/5ML IV SOLN
5.0000 mg | INTRAVENOUS | Status: DC | PRN
  Administered 2017-10-13: 5 mg via INTRAVENOUS
  Filled 2017-10-12: qty 5

## 2017-10-12 MED ORDER — POLYVINYL ALCOHOL 1.4 % OP SOLN
1.0000 [drp] | Freq: Four times a day (QID) | OPHTHALMIC | Status: DC | PRN
  Filled 2017-10-12: qty 15

## 2017-10-12 MED ORDER — AMLODIPINE BESYLATE 5 MG PO TABS
2.5000 mg | ORAL_TABLET | Freq: Every day | ORAL | Status: DC
  Administered 2017-10-14 – 2017-10-16 (×3): 2.5 mg via ORAL
  Filled 2017-10-12 (×3): qty 1

## 2017-10-12 MED ORDER — ACETAMINOPHEN 650 MG RE SUPP: 650.0000 mg | Freq: Four times a day (QID) | RECTAL | Status: DC | PRN

## 2017-10-12 MED ORDER — IPRATROPIUM-ALBUTEROL 0.5-2.5 (3) MG/3ML IN SOLN
3.0000 mL | Freq: Once | RESPIRATORY_TRACT | Status: AC
Start: 2017-10-12 — End: 2017-10-12
  Administered 2017-10-12: 3 mL via RESPIRATORY_TRACT
  Filled 2017-10-12: qty 3

## 2017-10-12 MED ORDER — METOPROLOL TARTRATE 25 MG PO TABS
12.5000 mg | ORAL_TABLET | Freq: Two times a day (BID) | ORAL | Status: DC
  Administered 2017-10-12 – 2017-10-16 (×6): 12.5 mg via ORAL
  Filled 2017-10-12 (×6): qty 1

## 2017-10-12 MED ORDER — FENTANYL CITRATE (PF) 100 MCG/2ML IJ SOLN
25.0000 ug | Freq: Once | INTRAMUSCULAR | Status: AC
  Administered 2017-10-12: 25 ug via INTRAVENOUS
  Filled 2017-10-12: qty 2

## 2017-10-12 MED ORDER — IPRATROPIUM-ALBUTEROL 0.5-2.5 (3) MG/3ML IN SOLN
3.0000 mL | Freq: Four times a day (QID) | RESPIRATORY_TRACT | Status: DC
  Administered 2017-10-13 – 2017-10-15 (×7): 3 mL via RESPIRATORY_TRACT
  Filled 2017-10-12 (×8): qty 3

## 2017-10-12 MED ORDER — ONDANSETRON HCL 4 MG/2ML IJ SOLN: 4.0000 mg | Freq: Four times a day (QID) | INTRAMUSCULAR | Status: DC | PRN

## 2017-10-12 MED ORDER — POLYETHYLENE GLYCOL 3350 17 G PO PACK
17.0000 g | PACK | Freq: Every day | ORAL | Status: DC | PRN
  Administered 2017-10-15: 17 g via ORAL
  Filled 2017-10-12: qty 1

## 2017-10-12 MED ORDER — VITAMIN D 1000 UNITS PO TABS
1000.0000 [IU] | ORAL_TABLET | Freq: Every day | ORAL | Status: DC
  Administered 2017-10-14 – 2017-10-16 (×3): 1000 [IU] via ORAL
  Filled 2017-10-12 (×3): qty 1

## 2017-10-12 MED ORDER — ONDANSETRON HCL 4 MG PO TABS: 4.0000 mg | ORAL_TABLET | Freq: Four times a day (QID) | ORAL | Status: DC | PRN

## 2017-10-12 MED ORDER — BISACODYL 5 MG PO TBEC
5.0000 mg | DELAYED_RELEASE_TABLET | Freq: Two times a day (BID) | ORAL | Status: DC
  Administered 2017-10-12 – 2017-10-16 (×6): 5 mg via ORAL
  Filled 2017-10-12 (×7): qty 1

## 2017-10-12 NOTE — ED Notes (Signed)
Help the patient to the restroom. She moves very slowly, baby steps. The patient grimaced severely while walking her and while placing her back in the bed.

## 2017-10-12 NOTE — ED Notes (Signed)
Called [employer] for a TLSO brace/ waiting on call back from Rep.

## 2017-10-12 NOTE — Progress Notes (Signed)
Family Meeting Note  Advance Directive:yes  Today a meeting took place with the son on the phone Patient  is unable to participate due to impaired hearing not able to make decision at present   The following were discussed:Patient's diagnosis: , Patient's progosis: She has multiple medical comorbidities.  She is being admitted with T12 vertebral compression fracture after [facility].  CODE STATUS discussed with patient's son on the phone.  Patient is a DNR Time spent during discussion:16 mins  Enedina FinnerSona Zhavia Cunanan, MD

## 2017-10-12 NOTE — ED Provider Notes (Signed)
[facility] Emergency Department Provider Note  ____________________________________________   First MD Initiated Contact with Patient 10/12/17 1631     (approximate)  I have reviewed the triage vital signs and the nursing notes.   HISTORY  Chief Complaint Fall   HPI Kelly Booth is a 84 y.o. female who presents to the emergency department via EMS with moderate to severe low back pain after mechanical fall while at home today.  She lives at home alone and today said she slipped and fell to the ground.  She was unable to get up on her own and family called 911.  She was given 50 mcg of fentanyl in route.  She denies numbness or weakness.  She denies antecedent chest pain shortness of breath abdominal pain nausea vomiting or palpitations.  She denies striking her head.  She does have a complex past medical history including atrial fibrillation, COPD, pulmonary fibrosis, and congestive heart failure.  Past Medical History:  Diagnosis Date  . A-fib (HCC)   . Allergic rhinitis   . Constipation   . Dyspepsia   . Esophageal reflux   . Generalized weakness   . Glaucoma   . Hypertension   . Osteoporosis   . Pulmonary fibrosis (HCC)    There is no evidence of pulmonary fibrosis on CT chest    Patient Active Problem List   Diagnosis Date Noted  . Acute low back pain 10/12/2017  . Compression fracture of body of thoracic vertebra (HCC) 10/12/2017  . Acute respiratory failure with hypoxia and hypercapnia (HCC) 06/11/2016  . Acute diastolic CHF (congestive heart failure) (HCC) 06/11/2016  . Bilateral pleural effusion 06/11/2016  . Severe aortic stenosis 06/11/2016  . Acute posthemorrhagic anemia 06/11/2016  . Acute renal insufficiency 06/11/2016  . A. fib, RVR 06/11/2016  . Atrial fibrillation with RVR (HCC) 06/11/2016  . Elevated troponin 06/11/2016  . UTI (urinary tract infection) 06/11/2016  . DNR (do not resuscitate) 06/09/2016  . Palliative care by  specialist 06/09/2016  . Weakness generalized 06/09/2016  . Dyspnea   . Bleeding gastrointestinal 06/05/2016  . Malnutrition of moderate degree 12/06/2015  . Bradycardia 12/05/2015    Past Surgical History:  Procedure Laterality Date  . ABDOMINAL HYSTERECTOMY    . TOTAL HIP ARTHROPLASTY      Prior to Admission medications   Medication Sig Start Date End Date Taking? Authorizing Provider  amLODipine (NORVASC) 2.5 MG tablet Take 1 tablet by mouth daily. 09/18/15  Yes [provider]  bisacodyl (STIMULANT LAXATIVE) 5 MG EC tablet Take 5 mg by mouth 2 (two) times daily.  09/18/15  Yes [provider]  Calcium Carbonate-Vitamin D (CALCIUM-VITAMIN D) 500-200 MG-UNIT tablet Take 1 tablet by mouth 2 (two) times daily. 09/18/15  Yes [provider]  Cholecalciferol (VITAMIN D3) 1000 units CAPS Take 1 capsule by mouth daily. 09/18/15  Yes [provider]  ferrous sulfate 325 (65 FE) MG tablet Take 1 tablet by mouth daily. 09/18/15  Yes [provider]  furosemide (LASIX) 40 MG tablet Take 40 mg by mouth daily.  09/18/15  Yes [provider]  Ipratropium-Albuterol (COMBIVENT) 20-100 MCG/ACT AERS respimat Inhale 1 puff into the lungs 4 (four) times daily. 09/18/15  Yes [provider]  latanoprost (XALATAN) 0.005 % ophthalmic solution Apply 1 drop to eye at bedtime. 09/18/15  Yes [provider]  metoprolol tartrate (LOPRESSOR) 25 MG tablet Take 0.5 tablets (12.5 mg total) by mouth 2 (two) times daily. 06/11/16  Yes  Katharina CaperVaickute, Rima, MD  pantoprazole (PROTONIX) 40 MG tablet Take 1 tablet (40 mg total) by mouth 2 (two) times daily before a meal. 06/11/16  Yes Katharina CaperVaickute, Rima, MD  polyvinyl alcohol (ARTIFICIAL TEARS) 1.4 % ophthalmic solution Apply 1 drop to eye 4 (four) times daily as needed. 09/18/15  Yes [provider]  ranitidine (ZANTAC) 150 MG tablet Take 1 tablet by mouth at bedtime.  09/18/15  Yes [provider]     Allergies Ceclor [cefaclor]  Family History  Problem Relation Age of Onset  . Hypertension Other     Social History Social History   Tobacco Use  . Smoking status: Never Smoker  . Smokeless tobacco: Never Used  Substance Use Topics  . Alcohol use: No    Alcohol/week: 0.0 oz  . Drug use: No    Review of Systems Constitutional: No fever/chills Eyes: No visual changes. ENT: No sore throat. Cardiovascular: Denies chest pain. Respiratory: Denies shortness of breath. Gastrointestinal: No abdominal pain.  No nausea, no vomiting.  No diarrhea.  No constipation. Genitourinary: Negative for dysuria. Musculoskeletal: Positive for back pain. Skin: Negative for rash. Neurological: Negative for headaches, focal weakness or numbness.   ____________________________________________   PHYSICAL EXAM:  VITAL SIGNS: ED Triage Vitals  Enc Vitals Group     BP      Pulse      Resp      Temp      Temp src      SpO2      Weight      Height      Head Circumference      Peak Flow      Pain Score      Pain Loc      Pain Edu?      Excl. in GC?     Constitutional: Pleasant cooperative no acute distress Eyes: PERRL EOMI. Head: Atraumatic. Nose: No congestion/rhinnorhea. Mouth/Throat: No trismus Neck: No stridor.   Cardiovascular: Normal rate, regular rhythm. Grossly normal heart sounds.  Good peripheral circulation. Respiratory: Normal respiratory effort.  No retractions. Lungs CTAB and moving good air Gastrointestinal: Soft nontender Musculoskeletal: Significantly kyphotic with midline lumbar tenderness Neurologic:  Normal speech and language. No gross focal neurologic deficits are appreciated. Skin:  Skin is warm, dry and intact. No rash noted. Psychiatric: Mood and affect are normal. Speech and behavior are normal.    ____________________________________________   DIFFERENTIAL includes but not limited to  Lumbar spine fracture, thoracic spine fracture, mechanical  fall, syncope, cardiogenic syncope ____________________________________________   LABS (all labs ordered are listed, but only abnormal results are displayed)  Labs Reviewed  COMPREHENSIVE METABOLIC PANEL - Abnormal; Notable for the following components:      Result Value   Chloride 99 (*)    Glucose, Bld 136 (*)    BUN 29 (*)    Creatinine, Ser 1.21 (*)    GFR calc non Af Amer 37 (*)    GFR calc Af Amer 43 (*)    All other components within normal limits  CBC WITH DIFFERENTIAL/PLATELET - Abnormal; Notable for the following components:   RBC 3.35 (*)    Hemoglobin 11.8 (*)    HCT 33.6 (*)    MCV 100.4 (*)    MCH 35.1 (*)    Neutro Abs 8.0 (*)    All other components within normal limits  TROPONIN I    Lab work reviewed by me no acute disease __________________________________________  EKG   ____________________________________________  RADIOLOGY  Chest x-ray reviewed by me with significant compression fracture of T12 ____________________________________________   PROCEDURES  Procedure(s) performed: no  Procedures  Critical Care performed: no  Observation: no ____________________________________________   INITIAL IMPRESSION / ASSESSMENT AND PLAN / ED COURSE  Pertinent labs & imaging results that were available during my care of the patient were reviewed by me and considered in my medical decision making (see chart for details).      ----------------------------------------- 6:18 PM on 10/12/2017 -----------------------------------------  I discussed the case with on-call neurosurgeon Dr. Adriana Simas who recommended a TLSO brace.  This is challenging as the patient has severe kyphosis at baseline and may be unable to wear brace regardless.  Patient received 50 mcg of fentanyl in route and is now requiring an additional dose of intravenous fentanyl.  Given her increasing opioid requirements the patient does require inpatient admission to the medical service for  continued pain management and physical therapy evaluation.  I discussed with the patient and family at bedside who verbalized understanding and agreement with the plan.  ____________________________________________   FINAL CLINICAL IMPRESSION(S) / ED DIAGNOSES  Final diagnoses:  Fall, initial encounter  Acute midline low back pain without sciatica  Other closed fracture of twelfth thoracic vertebra, initial encounter (HCC)      NEW MEDICATIONS STARTED DURING THIS VISIT:  This SmartLink is deprecated. Use AVSMEDLIST instead to display the medication list for a patient.   Note:  This document was prepared using Dragon voice recognition software and may include unintentional dictation errors.     Merrily Brittle, MD 10/12/17 2040

## 2017-10-12 NOTE — Progress Notes (Signed)
Report received from  RN [place], I expressed concerns about patients blood pressure and if PRN medication was given or if it was pain related. Patient on the way to floor.

## 2017-10-12 NOTE — H&P (Signed)
[facility] Physicians - [place] at [facility]   PATIENT NAME: Kelly Booth    MR#:  [id number]  DATE OF BIRTH:  09/17/1920  DATE OF ADMISSION:  10/12/2017  PRIMARY CARE PHYSICIAN: Raynelle Bring   REQUESTING/REFERRING PHYSICIAN: dr Katherine Mantle  CHIEF COMPLAINT:   Back pain HISTORY OF PRESENT ILLNESS:  Kelly Booth  is a 84 y.o. female with a known history of A. fib, glaucoma, hypertension and osteoporosis comes in from [place] after she had an unwitnessed fall.  Patient complained of back pain.  She was found to have T12 compression fracture on x-rays.  ER MD spoke with Dr. Adriana Simas neurosurgery recommends pain control and physical therapy evaluation.  There is no urgent surgical indication.  Family in the room during my evaluation.  Patient is hard on hearing unable to provide any history other than complaining of back pain.  PAST MEDICAL HISTORY:   Past Medical History:  Diagnosis Date  . A-fib (HCC)   . Allergic rhinitis   . Constipation   . Dyspepsia   . Esophageal reflux   . Generalized weakness   . Glaucoma   . Hypertension   . Osteoporosis   . Pulmonary fibrosis (HCC)    There is no evidence of pulmonary fibrosis on CT chest    PAST SURGICAL HISTOIRY:   Past Surgical History:  Procedure Laterality Date  . ABDOMINAL HYSTERECTOMY    . TOTAL HIP ARTHROPLASTY      SOCIAL HISTORY:   Social History   Tobacco Use  . Smoking status: Never Smoker  . Smokeless tobacco: Never Used  Substance Use Topics  . Alcohol use: No    Alcohol/week: 0.0 oz    FAMILY HISTORY:   Family History  Problem Relation Age of Onset  . Hypertension Other     DRUG ALLERGIES:   Allergies  Allergen Reactions  . Ceclor [Cefaclor]     Passed out     REVIEW OF SYSTEMS:  Review of Systems  Constitutional: Negative for chills, fever and weight loss.  HENT: Negative for ear discharge, ear pain and nosebleeds.   Eyes: Negative for blurred vision, pain and  discharge.  Respiratory: Negative for sputum production, shortness of breath, wheezing and stridor.   Cardiovascular: Negative for chest pain, palpitations, orthopnea and PND.  Gastrointestinal: Negative for abdominal pain, diarrhea, nausea and vomiting.  Genitourinary: Negative for frequency and urgency.  Musculoskeletal: Positive for back pain. Negative for joint pain.  Neurological: Negative for sensory change, speech change, focal weakness and weakness.  Psychiatric/Behavioral: Negative for depression and hallucinations. The patient is not nervous/anxious.      MEDICATIONS AT HOME:   Prior to Admission medications   Medication Sig Start Date End Date Taking? Authorizing Provider  amLODipine (NORVASC) 2.5 MG tablet Take 1 tablet by mouth daily. 09/18/15  Yes [provider]  bisacodyl (STIMULANT LAXATIVE) 5 MG EC tablet Take 5 mg by mouth 2 (two) times daily.  09/18/15  Yes [provider]  Calcium Carbonate-Vitamin D (CALCIUM-VITAMIN D) 500-200 MG-UNIT tablet Take 1 tablet by mouth 2 (two) times daily. 09/18/15  Yes [provider]  Cholecalciferol (VITAMIN D3) 1000 units CAPS Take 1 capsule by mouth daily. 09/18/15  Yes [provider]  ferrous sulfate 325 (65 FE) MG tablet Take 1 tablet by mouth daily. 09/18/15  Yes [provider]  furosemide (LASIX) 40 MG tablet Take 40 mg by mouth daily.  09/18/15  Yes [provider]  Ipratropium-Albuterol (COMBIVENT) 20-100 MCG/ACT AERS  respimat Inhale 1 puff into the lungs 4 (four) times daily. 09/18/15  Yes [provider]  latanoprost (XALATAN) 0.005 % ophthalmic solution Apply 1 drop to eye at bedtime. 09/18/15  Yes [provider]  metoprolol tartrate (LOPRESSOR) 25 MG tablet Take 0.5 tablets (12.5 mg total) by mouth 2 (two) times daily. 06/11/16  Yes Katharina CaperVaickute, Rima, MD  pantoprazole (PROTONIX) 40 MG tablet Take 1 tablet (40 mg total) by mouth 2 (two) times daily before a  meal. 06/11/16  Yes Katharina CaperVaickute, Rima, MD  polyvinyl alcohol (ARTIFICIAL TEARS) 1.4 % ophthalmic solution Apply 1 drop to eye 4 (four) times daily as needed. 09/18/15  Yes [provider]  ranitidine (ZANTAC) 150 MG tablet Take 1 tablet by mouth at bedtime.  09/18/15  Yes [provider]      VITAL SIGNS:  Blood pressure (!) 164/64, pulse 65, resp. rate 16, SpO2 98 %.  PHYSICAL EXAMINATION:  GENERAL:  84 y.o.-year-old patient lying in the bed with no acute distress.  Very thin frail EYES: Pupils equal, round, reactive to light and accommodation. No scleral icterus. Extraocular muscles intact.  HEENT: Head atraumatic, normocephalic. Oropharynx and nasopharynx clear.  NECK:  Supple, no jugular venous distention. No thyroid enlargement, no tenderness.  LUNGS: Normal breath sounds bilaterally, no wheezing, rales,rhonchi or crepitation. No use of accessory muscles of respiration.  CARDIOVASCULAR: S1, S2 normal. No murmurs, rubs, or gallops.  ABDOMEN: Soft, nontender, nondistended. Bowel sounds present. No organomegaly or mass.  EXTREMITIES: No pedal edema, cyanosis, or clubbing.  NEUROLOGIC: Unable to assess in detail.  However patient moves all extremities well.  PSYCHIATRIC: The patient is alert and on hearing SKIN: No obvious rash, lesion, or ulcer.   LABORATORY PANEL:   CBC Recent Labs  Lab 10/12/17 1637  WBC 10.4  HGB 11.8*  HCT 33.6*  PLT 209   ------------------------------------------------------------------------------------------------------------------  Chemistries  Recent Labs  Lab 10/12/17 1637  NA 140  K 3.8  CL 99*  CO2 32  GLUCOSE 136*  BUN 29*  CREATININE 1.21*  CALCIUM 9.1  AST 26  ALT 15  ALKPHOS 65  BILITOT 0.5   ------------------------------------------------------------------------------------------------------------------  Cardiac Enzymes Recent Labs  Lab 10/12/17 1637  TROPONINI <0.03    ------------------------------------------------------------------------------------------------------------------  RADIOLOGY:  Dg Chest 2 View  Result Date: 10/12/2017 CLINICAL DATA:  Unwitnessed fall. Back pain. Atrial fibrillation and pulmonary fibrosis. EXAM: CHEST  2 VIEW COMPARISON:  06/09/2016 FINDINGS: Chronic cardiomegaly and aortic atherosclerosis. Chronic fibrotic changes of the lungs. Bilateral effusions with dependent atelectasis. Probable mild pulmonary edema. Multiple thoracic compression fractures uncertain age. Near complete collapse T12. multiple old healed left-sided rib fractures. IMPRESSION: Congestive heart failure. Edema with effusions and basilar atelectasis. Multiple thoracic compression fractures, age indeterminate. T12 fracture shows vertebra plana. Electronically Signed   By: Paulina FusiMark  Shogry M.D.   On: 10/12/2017 17:18   Dg Lumbar Spine Complete  Result Date: 10/12/2017 CLINICAL DATA:  [name].  Back pain. EXAM: LUMBAR SPINE - COMPLETE 4+ VIEW COMPARISON:  CT abdomen 06/05/2016 FINDINGS: Old partial compression fracture at L3. T12 compression fracture with vertebra plana as noted at chest radiography, not present in September of 2017 but age indeterminate. Chronic degenerative disc disease and degenerative facet disease in the lumbar region. IMPRESSION: Old compression fracture at L3. Vertebra plana compression fracture at T12, not present in September of 2017, but exact age indeterminate. Electronically Signed   By: Paulina FusiMark  Shogry M.D.   On: 10/12/2017 17:21    EKG:    IMPRESSION AND  PLAN:   Tabbetha Kutscher  is a 84 y.o. female with a known history of A. fib, glaucoma, hypertension and osteoporosis comes in from [place] after she had an unwitnessed fall.  Patient complained of back pain.  She was found to have T12 compression fracture on x-rays.  1.  T12 vertebral compression fracture status post unwitnessed fall at [facility] -adMit to orthopedic floor -Oral pain  medication as needed -Lidocaine patch if needed -Neurosurgery Dr. Adriana Simas recommended lower back brace if patient able to tolerate -Physical therapy to see patient  2.  Hypertension, uncontrolled Continue home meds PRN IV hydralazine  3.  Glaucoma continue eyedrops  4.  History of pulmonary fibrosis continue inhalers and oxygen  5.  History of osteoporosis continue calcium with vitamin D  6.  DVT prophylaxis subcu Lovenox    All the records are reviewed and case discussed with ED provider. Management plans discussed with the patient, family and they are in agreement.  CODE STATUS: dnr  TOTAL TIME TAKING CARE OF THIS PATIENT: 45 minutes.    Enedina Finner M.D on 10/12/2017 at 8:02 PM  Between 7am to 6pm - Pager - [phone]  After 6pm go to [email or web address] - password [email or web address] [facility]  [facility]  Office  [phone]  CC: Primary care physician; Raynelle Bring

## 2017-10-12 NOTE — ED Triage Notes (Signed)
[facility] to ED via [facility] with c/o unwitnessed fall. Pt c/o mid/lower back pain. PT was given 50mcg of fentanyl in route. Pt &OX4

## 2017-10-12 NOTE — ED Notes (Addendum)
Per [employer], they will place  TLSO brace on pt in the am since pt is being admitted

## 2017-10-13 DIAGNOSIS — K219 Gastro-esophageal reflux disease without esophagitis: Secondary | ICD-10-CM | POA: Diagnosis present

## 2017-10-13 DIAGNOSIS — J9601 Acute respiratory failure with hypoxia: Secondary | ICD-10-CM | POA: Diagnosis present

## 2017-10-13 DIAGNOSIS — R64 Cachexia: Secondary | ICD-10-CM | POA: Diagnosis present

## 2017-10-13 DIAGNOSIS — W010XXA Fall on same level from slipping, tripping and stumbling without subsequent striking against object, initial encounter: Secondary | ICD-10-CM | POA: Diagnosis present

## 2017-10-13 DIAGNOSIS — J8410 Pulmonary fibrosis, unspecified: Secondary | ICD-10-CM | POA: Diagnosis present

## 2017-10-13 DIAGNOSIS — Z96649 Presence of unspecified artificial hip joint: Secondary | ICD-10-CM | POA: Diagnosis present

## 2017-10-13 DIAGNOSIS — Z66 Do not resuscitate: Secondary | ICD-10-CM | POA: Diagnosis present

## 2017-10-13 DIAGNOSIS — M810 Age-related osteoporosis without current pathological fracture: Secondary | ICD-10-CM | POA: Diagnosis present

## 2017-10-13 DIAGNOSIS — J9621 Acute and chronic respiratory failure with hypoxia: Secondary | ICD-10-CM | POA: Diagnosis present

## 2017-10-13 DIAGNOSIS — S22088A Other fracture of T11-T12 vertebra, initial encounter for closed fracture: Secondary | ICD-10-CM | POA: Diagnosis present

## 2017-10-13 DIAGNOSIS — I4891 Unspecified atrial fibrillation: Secondary | ICD-10-CM | POA: Diagnosis present

## 2017-10-13 DIAGNOSIS — Z881 Allergy status to other antibiotic agents status: Secondary | ICD-10-CM | POA: Diagnosis not present

## 2017-10-13 DIAGNOSIS — Z681 Body mass index (BMI) 19 or less, adult: Secondary | ICD-10-CM | POA: Diagnosis not present

## 2017-10-13 DIAGNOSIS — I5032 Chronic diastolic (congestive) heart failure: Secondary | ICD-10-CM | POA: Diagnosis present

## 2017-10-13 DIAGNOSIS — H409 Unspecified glaucoma: Secondary | ICD-10-CM | POA: Diagnosis present

## 2017-10-13 DIAGNOSIS — I110 Hypertensive heart disease with heart failure: Secondary | ICD-10-CM | POA: Diagnosis present

## 2017-10-13 DIAGNOSIS — H9190 Unspecified hearing loss, unspecified ear: Secondary | ICD-10-CM | POA: Diagnosis present

## 2017-10-13 DIAGNOSIS — J449 Chronic obstructive pulmonary disease, unspecified: Secondary | ICD-10-CM | POA: Diagnosis present

## 2017-10-13 LAB — MRSA PCR SCREENING: MRSA BY PCR: NEGATIVE

## 2017-10-13 MED ORDER — ENOXAPARIN SODIUM 30 MG/0.3ML ~~LOC~~ SOLN
30.0000 mg | SUBCUTANEOUS | Status: DC
  Administered 2017-10-13 – 2017-10-15 (×3): 30 mg via SUBCUTANEOUS
  Filled 2017-10-13 (×3): qty 0.3

## 2017-10-13 MED ORDER — FUROSEMIDE 10 MG/ML IJ SOLN
20.0000 mg | Freq: Once | INTRAMUSCULAR | Status: AC
  Administered 2017-10-13: 20 mg via INTRAVENOUS
  Filled 2017-10-13: qty 4

## 2017-10-13 MED ORDER — METHYLPREDNISOLONE SODIUM SUCC 125 MG IJ SOLR
60.0000 mg | INTRAMUSCULAR | Status: DC
  Administered 2017-10-13 – 2017-10-15 (×3): 60 mg via INTRAVENOUS
  Filled 2017-10-13 (×3): qty 2

## 2017-10-13 MED ORDER — FUROSEMIDE 40 MG PO TABS
40.0000 mg | ORAL_TABLET | Freq: Every day | ORAL | Status: DC
  Administered 2017-10-14 – 2017-10-16 (×3): 40 mg via ORAL
  Filled 2017-10-13 (×3): qty 1

## 2017-10-13 NOTE — Progress Notes (Signed)
Pt's respirations and work of breathing through mid morning till this afternoon have improved and not been as labored. Still on high flow Pulcifer at 50L 80% FiO2. Pt states she feels better. She did have some intermittent confusion-at one point pulled out IV and took IV and gown off.   [place]ford[place], Latricia HeftKorie G

## 2017-10-13 NOTE — Progress Notes (Signed)
[facility] - [place] at [facility]   PATIENT NAME: Kelly Booth    MR#:  [id number]  DATE OF BIRTH:  04/21/1920  SUBJECTIVE:   Patient did well overnight.  She was placed on a course at with rest and she felt very tight started having difficulty breathing sats dropped down in the 70s.  She is currently on nonrebreather and sats are 87% on nonrebreather. Son in the room REVIEW OF SYSTEMS:   Review of Systems  Unable to perform ROS: Acuity of condition   Tolerating Diet:yes Tolerating PT: pending  DRUG ALLERGIES:   Allergies  Allergen Reactions  . Ceclor [Cefaclor]     Passed out     VITALS:  Blood pressure (!) 169/65, pulse 71, temperature 98.4 F (36.9 C), resp. rate 18, height 4\' 10"  (1.473 m), weight 43.1 kg (95 lb), SpO2 (!) 89 %.  PHYSICAL EXAMINATION:   Physical Exam  GENERAL:  84 y.o.-year-old patient lying in the bed with mild to moderate acute respiratory distress.  Thin cachectic and frail EYES: Pupils equal, round, reactive to light and accommodation. No scleral icterus. HEENT: Head atraumatic, normocephalic. Oropharynx and nasopharynx clear.  NECK:  Supple, no jugular venous distention. No thyroid enlargement, no tenderness.  LUNGS: coarse breath sounds bilaterally, no wheezing, rales, rhonchi. ++ use of accessory muscles of respiration.  CARDIOVASCULAR: S1, S2 normal. No murmurs, rubs, or gallops.  ABDOMEN: Soft, nontender, nondistended. Bowel sounds present. No organomegaly or mass.  EXTREMITIES: No cyanosis, clubbing or edema b/l.    NEUROLOGIC: Cranial nerves II through XII are intact. No focal Motor or sensory deficits b/l.   PSYCHIATRIC:  patient is alert and awake SKIN: No obvious rash, lesion, or ulcer.   LABORATORY PANEL:  CBC Recent Labs  Lab 10/12/17 1637  WBC 10.4  HGB 11.8*  HCT 33.6*  PLT 209    Chemistries  Recent Labs  Lab 10/12/17 1637  NA 140  K 3.8  CL 99*  CO2 32  GLUCOSE 136*  BUN 29*   CREATININE 1.21*  CALCIUM 9.1  AST 26  ALT 15  ALKPHOS 65  BILITOT 0.5   Cardiac Enzymes Recent Labs  Lab 10/12/17 1637  TROPONINI <0.03   RADIOLOGY:  Dg Chest 2 View  Result Date: 10/12/2017 CLINICAL DATA:  Unwitnessed fall. Back pain. Atrial fibrillation and pulmonary fibrosis. EXAM: CHEST  2 VIEW COMPARISON:  06/09/2016 FINDINGS: Chronic cardiomegaly and aortic atherosclerosis. Chronic fibrotic changes of the lungs. Bilateral effusions with dependent atelectasis. Probable mild pulmonary edema. Multiple thoracic compression fractures uncertain age. Near complete collapse T12. multiple old healed left-sided rib fractures. IMPRESSION: Congestive heart failure. Edema with effusions and basilar atelectasis. Multiple thoracic compression fractures, age indeterminate. T12 fracture shows vertebra plana. Electronically Signed   By: Paulina FusiMark  Shogry M.D.   On: 10/12/2017 17:18   Dg Lumbar Spine Complete  Result Date: 10/12/2017 CLINICAL DATA:  [name].  Back pain. EXAM: LUMBAR SPINE - COMPLETE 4+ VIEW COMPARISON:  CT abdomen 06/05/2016 FINDINGS: Old partial compression fracture at L3. T12 compression fracture with vertebra plana as noted at chest radiography, not present in September of 2017 but age indeterminate. Chronic degenerative disc disease and degenerative facet disease in the lumbar region. IMPRESSION: Old compression fracture at L3. Vertebra plana compression fracture at T12, not present in September of 2017, but exact age indeterminate. Electronically Signed   By: Paulina FusiMark  Shogry M.D.   On: 10/12/2017 17:21   ASSESSMENT AND PLAN:  Kelly Booth  is a 84  y.o. female with a known history of A. fib, glaucoma, hypertension and osteoporosis comes in from [place] after she had an unwitnessed fall.  Patient complained of back pain.  She was found to have T12 compression fracture on x-rays.  1.  Acute hypoxic respiratory failure secondary to inability to tolerate vest/brace.  Patient has history of  underlying chronic bilateral pulmonary fibrosis -Patient was doing well prior to putting the brace.  Sats dropped down in the 70s.  She is currently on nonrebreather with sats around 87% -We will give 1 dose of Lasix 20 mg and 1 dose of Solu-Medrol 60 mg. -I will hold off on using the brace. -Use morphine as needed to take age of the respiratory distress -We will try high flow nasal cannula if need be and/or BiPAP  2. T12 vertebral compression fracture status post unwitnessed fall at [facility] -Oral pain medication as needed -Lidocaine patch if needed -Neurosurgery Dr. Adriana Simas recommended lower back brace/vest if patient able to tolerate -Physical therapy to see patient  3. Hypertension, uncontrolled Continue home meds PRN IV hydralazine  3.  Glaucoma continue eyedrops  4.  History of osteoporosis continue calcium with vitamin D  5.  DVT prophylaxis subcu Lovenox  Spoke with son  Case discussed with Care Management/Social Worker. Management plans discussed with the patient, family and they are in agreement.  CODE STATUS: DNr  DVT Prophylaxis: lovenox  TOTAL TIME TAKING CARE OF THIS PATIENT: *35* minutes.  >50% time spent on counselling and coordination of care  POSSIBLE D/C IN few DAYS, DEPENDING ON CLINICAL CONDITION.  Note: This dictation was prepared with Dragon dictation along with smaller phrase technology. Any transcriptional errors that result from this process are unintentional.  Enedina Finner M.D on 10/13/2017 at 8:35 AM  Between 7am to 6pm - Pager - [phone]  After 6pm go to [email or web address] - Social research officer, government  [facility]  Office  [phone]  CC: Primary care physician; [place], KernodlePatient ID: Kelly Booth, female   DOB: Jan 09, 1920, 84 y.o.   MRN: [id number]

## 2017-10-13 NOTE — [place] FL2 (Signed)
[place] MEDICAID FL2 LEVEL OF CARE SCREENING TOOL     IDENTIFICATION  Patient Name: Kelly Booth Birthdate: 07-14-1920 Sex: female Admission Date (Current Location): 10/12/2017  [place] and [place] Number:  Chiropodist and Address:  [facility], [address], [place], [place] [address]      Provider Number: [id number]  Attending Physician Name and Address:  Enedina Finner, MD  Relative Name and Phone Number:       Current Level of Care: Hospital Recommended Level of Care: Skilled Nursing Facility Prior Approval Number:    Date Approved/Denied:   PASRR Number: [id number])  Discharge Plan: SNF    Current Diagnoses: Patient Active Problem List   Diagnosis Date Noted  . Acute respiratory failure with hypoxia (HCC) 10/13/2017  . Acute low back pain 10/12/2017  . Compression fracture of body of thoracic vertebra (HCC) 10/12/2017  . Acute respiratory failure with hypoxia and hypercapnia (HCC) 06/11/2016  . Acute diastolic CHF (congestive heart failure) (HCC) 06/11/2016  . Bilateral pleural effusion 06/11/2016  . Severe aortic stenosis 06/11/2016  . Acute posthemorrhagic anemia 06/11/2016  . Acute renal insufficiency 06/11/2016  . A. fib, RVR 06/11/2016  . Atrial fibrillation with RVR (HCC) 06/11/2016  . Elevated troponin 06/11/2016  . UTI (urinary tract infection) 06/11/2016  . DNR (do not resuscitate) 06/09/2016  . Palliative care by specialist 06/09/2016  . Weakness generalized 06/09/2016  . Dyspnea   . Bleeding gastrointestinal 06/05/2016  . Malnutrition of moderate degree 12/06/2015  . Bradycardia 12/05/2015    Orientation RESPIRATION BLADDER Height & Weight     Self, Time, Situation, Place  O2(High Flow ) Incontinent Weight: 95 lb (43.1 kg) Height:  4\' 10"  (147.3 cm)  BEHAVIORAL SYMPTOMS/MOOD NEUROLOGICAL BOWEL NUTRITION STATUS      Continent Diet(Diet: 2 Grams Sodium. )  AMBULATORY STATUS COMMUNICATION OF NEEDS Skin    Extensive Assist Verbally Normal                       Personal Care Assistance Level of Assistance  Bathing, Feeding, Dressing Bathing Assistance: Limited assistance Feeding assistance: Independent Dressing Assistance: Limited assistance     Functional Limitations Info  Sight, Hearing, Speech Sight Info: Adequate Hearing Info: Adequate Speech Info: Adequate    SPECIAL CARE FACTORS FREQUENCY  PT (By licensed PT), OT (By licensed OT)     PT Frequency: (5) OT Frequency: (5)            Contractures      Additional Factors Info  Code Status, Allergies Code Status Info: (DNR ) Allergies Info: (Ceclor Cefaclor)           Current Medications (10/13/2017):  This is the current hospital active medication list Current Facility-Administered Medications  Medication Dose Route Frequency Provider Last Rate Last Dose  . acetaminophen (TYLENOL) tablet 650 mg  650 mg Oral Q6H PRN Enedina Finner, MD       Or  . acetaminophen (TYLENOL) suppository 650 mg  650 mg Rectal Q6H PRN Enedina Finner, MD      . albuterol (PROVENTIL) (2.5 MG/3ML) 0.083% nebulizer solution 2.5 mg  2.5 mg Nebulization Q6H PRN Enedina Finner, MD      . amLODipine (NORVASC) tablet 2.5 mg  2.5 mg Oral Daily Enedina Finner, MD      . bisacodyl (DULCOLAX) EC tablet 5 mg  5 mg Oral BID Enedina Finner, MD   5 mg at 10/12/17 2141  . calcium-vitamin D (OSCAL WITH  D) 500-200 MG-UNIT per tablet 1 tablet  1 tablet Oral BID Enedina FinnerPatel, Sona, MD   1 tablet at 10/12/17 2141  . cholecalciferol (VITAMIN D) tablet 1,000 Units  1,000 Units Oral Daily Enedina FinnerPatel, Sona, MD      . enoxaparin (LOVENOX) injection 30 mg  30 mg Subcutaneous Q24H Enedina FinnerPatel, Sona, MD      . famotidine (PEPCID) tablet 20 mg  20 mg Oral Daily Enedina FinnerPatel, Sona, MD      . ferrous sulfate tablet 325 mg  325 mg Oral Daily Enedina FinnerPatel, Sona, MD      . Melene Muller[START ON 10/14/2017] furosemide (LASIX) tablet 40 mg  40 mg Oral Daily Enedina FinnerPatel, Sona, MD      . ipratropium-albuterol (DUONEB) 0.5-2.5 (3) MG/3ML  nebulizer solution 3 mL  3 mL Inhalation QID Enedina FinnerPatel, Sona, MD   3 mL at 10/13/17 1420  . latanoprost (XALATAN) 0.005 % ophthalmic solution 1 drop  1 drop Both Eyes QHS Enedina FinnerPatel, Sona, MD   1 drop at 10/12/17 2142  . methylPREDNISolone sodium succinate (SOLU-MEDROL) 125 mg/2 mL injection 60 mg  60 mg Intravenous Q24H Enedina FinnerPatel, Sona, MD   60 mg at 10/13/17 0844  . metoprolol tartrate (LOPRESSOR) injection 5 mg  5 mg Intravenous Q4H PRN Gouru, Aruna, MD   5 mg at 10/13/17 0853  . metoprolol tartrate (LOPRESSOR) tablet 12.5 mg  12.5 mg Oral BID Enedina FinnerPatel, Sona, MD   12.5 mg at 10/12/17 2141  . morphine 2 MG/ML injection 1 mg  1 mg Intravenous Q4H PRN Gouru, Aruna, MD   1 mg at 10/13/17 0853  . ondansetron (ZOFRAN) tablet 4 mg  4 mg Oral Q6H PRN Enedina FinnerPatel, Sona, MD       Or  . ondansetron [facility](ZOFRAN) injection 4 mg  4 mg Intravenous Q6H PRN Enedina FinnerPatel, Sona, MD      . oxyCODONE (Oxy IR/ROXICODONE) immediate release tablet 5 mg  5 mg Oral Q4H PRN Gouru, Aruna, MD      . pantoprazole (PROTONIX) EC tablet 40 mg  40 mg Oral BID AC Enedina FinnerPatel, Sona, MD   40 mg at 10/13/17 1808  . polyethylene glycol (MIRALAX / GLYCOLAX) packet 17 g  17 g Oral Daily PRN Enedina FinnerPatel, Sona, MD      . polyvinyl alcohol (LIQUIFILM TEARS) 1.4 % ophthalmic solution 1 drop  1 drop Both Eyes QID PRN Enedina FinnerPatel, Sona, MD      . traMADol Janean Sark(ULTRAM) tablet 50 mg  50 mg Oral Q6H PRN Enedina FinnerPatel, Sona, MD         Discharge Medications: Please see discharge summary for a list of discharge medications.  Relevant Imaging Results:  Relevant Lab Results:   Additional Information (SSN: [id number])  Dalayza Zambrana, Darleen CrockerBailey M, LCSW

## 2017-10-13 NOTE — Progress Notes (Signed)
Decreased to 80% Fio2

## 2017-10-13 NOTE — Progress Notes (Signed)
lovenox changed to 30 mg daily for CrCl <30 and BMI <40.

## 2017-10-13 NOTE — Progress Notes (Signed)
PT Cancellation Note  Patient Details Name: Kelly ShaggyHilda C Madewell MRN: [id number] DOB: 11/22/1919   Cancelled Treatment:    Reason Eval/Treat Not Completed: Patient not medically ready.  PT consult received.  Chart reviewed.  Per discussion with nursing, will hold PT at this time d/t respiratory concerns.  Will re-attempt PT evaluation at a later date/time as medically appropriate.  Hendricks LimesEmily Raheim Beutler, PT 10/13/17, 11:34 AM [phone]

## 2017-10-13 NOTE — Clinical Social Work Placement (Signed)
   CLINICAL SOCIAL WORK PLACEMENT  NOTE  Date:  10/13/2017  Patient Details  Name: Kelly Booth MRN: [id number] Date of Birth: 10/12/1919  Clinical Social Work is seeking post-discharge placement for this patient at the Skilled  Nursing Facility level of care (*CSW will initial, date and re-position this form in  chart as items are completed):  Yes   Patient/family provided with [facility] Clinical Social Work Department's list of facilities offering this level of care within the geographic area requested by the patient (or if unable, by the patient's family).  Yes   Patient/family informed of their freedom to choose among providers that offer the needed level of care, that participate in Medicare, Medicaid or managed care program needed by the patient, have an available bed and are willing to accept the patient.  Yes   Patient/family informed of 's ownership interest in [facility] and [facility], as well as of the fact that they are under no obligation to receive care at these facilities.  PASRR submitted to EDS on       PASRR number received on       Existing PASRR number confirmed on 10/13/17     FL2 transmitted to all facilities in geographic area requested by pt/family on 10/13/17     FL2 transmitted to all facilities within larger geographic area on       Patient informed that his/her managed care company has contracts with or will negotiate with certain facilities, including the following:            Patient/family informed of bed offers received.  Patient chooses bed at       Physician recommends and patient chooses bed at      Patient to be transferred to   on  .  Patient to be transferred to facility by       Patient family notified on   of transfer.  Name of family member notified:        PHYSICIAN       Additional Comment:    _______________________________________________ Lashia Niese, Darleen CrockerBailey M, LCSW 10/13/2017, 7:34 PM

## 2017-10-13 NOTE — Clinical Social Work Note (Signed)
Clinical Social Work Assessment  Patient Details  Name: Kelly Booth MRN: [id number] Date of Birth: 16-Oct-1919  Date of referral:  10/13/17               Reason for consult:  Facility Placement                Permission sought to share information with:  Chartered certified accountant granted to share information::  Yes, Verbal Permission Granted  Name::      [facility]   Agency::     Relationship::     Contact Information:     Housing/Transportation Living arrangements for the past 2 months:  [place] of Information:  Adult Children Patient Interpreter Needed:  None Criminal Activity/Legal Involvement Pertinent to Current Situation/Hospitalization:  No - Comment as needed Significant Relationships:  Adult Children Lives with:  Self Do you feel safe going back to the place where you live?  Yes Need for family participation in patient care:  Yes (Comment)  Care giving concerns:  Patient lives in the independent living section at [facility].    Social Worker assessment / plan:  Holiday representative (Nittany) reviewed chart and noted that patient is from [place]. CSW contacted [facility] and spoke to admissions coordinator Colletta Maryland who reported that patient is in the independent living section. CSW made Colletta Maryland aware that patient may need to go to the rehab section after her hospital stay. CSW met with patient and she was on high flow and did not participate in assessment. Patient's son/ HPOA [name] [phone] and 2 grandsons were at bedside. Per son he is patient's HPOA and she has lived at [place] independent living for almost 2 years now. Per son patient ambulates with a roll aider at baseline. Son is open to patient going to [facility] for rehab or back to independent living depending on her progression at [facility]. CSW explained that medicare requires a 3 night qualifying inpatient stay in the hospital in order to pay for rehab. Patient was  admitted to inpatient on 10/13/17. Son verbalized his understanding. FL2 complete. CSW will continue to follow and assist as needed.   Employment status:  Retired Forensic scientist:  Medicare PT Recommendations:  Not assessed at this time Information / Referral to community resources:  [place]  Patient/Family's Response to care:  Patient's son is open to SNF for rehab.   Patient/Family's Understanding of and Emotional Response to Diagnosis, Current Treatment, and Prognosis:  Patient's son was very pleasant and thanked CSW for assistance.   Emotional Assessment Appearance:  Appears stated age Attitude/Demeanor/Rapport:    Affect (typically observed):  Unable to Assess Orientation:  Oriented to Self, Oriented to Place, Oriented to  Time, Oriented to Situation Alcohol / Substance use:  Not Applicable Psych involvement (Current and /or in the community):  No (Comment)  Discharge Needs  Concerns to be addressed:  Discharge Planning Concerns Readmission within the last 30 days:  No Current discharge risk:  Dependent with Mobility Barriers to Discharge:  Continued Medical Work up   [employer], Veronia Beets, LCSW 10/13/2017, 7:36 PM

## 2017-10-13 NOTE — Progress Notes (Signed)
During shift change this am pt was on 2L and was not in distress. [employer] applied TLSO brace around 8am. Pt's son called RN to room around 8:30 because he was concerned about her breathing. On assessment, pt is tachypneic RR 26, respirations are labored and shallow. O2 sats on 2L are in the 70s. On 6L [place], sats were still in mid to high 70s. Duoneb given to pt, and non-rebreather applied. Sats on NRB are in mid 80's. Respiratory called to assess pt. Dr. Allena KatzPatel came to assess pt and put in orders for IV lasix and IV solumedrol. IV morphine also administered. Will continue to monitor.   [place], Latricia HeftKorie G

## 2017-10-14 NOTE — Progress Notes (Signed)
SATURATION QUALIFICATIONS: (This note is used to comply with regulatory documentation for home oxygen)  Patient Saturations on Room Air at Rest = 74%    Patient Saturations on 3 Liters of oxygen while Ambulating = 86 %  Please briefly explain why patient needs home oxygen: hypoxia at rest and desaturations on oxygen with ambulation

## 2017-10-14 NOTE — Evaluation (Signed)
Physical Therapy Evaluation Patient Details Name: Kelly Booth MRN: [id number] DOB: 04/13/1920 Today's Date: 10/14/2017   History of Present Illness  Pt is a 84 y.o. female presenting to hospital s/p mechanical fall with moderate to severe LBP; pt with severe kyphosis baseline.  Imaging showing old compression fx L3 and newer vertebra plana compression fx T12.  Neurosurgery recommending pain control, PT eval, and brace if pt able to tolerate.  After putting on the brace 10/13/17 pt with respiratory issues.  PMH includes a-fib, COPD, pulmonary fibrosis, CHF, and THA.    Clinical Impression  Prior to hospital admission, pt reports ambulating with rollator (ambulates to dining hall for meals).  [facility].  Currently pt is mod assist supine to sit via logrolling, min assist to stand from bed (CGA from recliner chair), and CGA to min assist to ambulate short distance bed to recliner with RW (significant extra time to perform this and pt noted with increased SOB with activity and some unsteadiness with turning towards chair).  Pt's O2 95% at rest on 3 L beginning of session; decreased to 86% after getting in chair; and returned to 94% end of session resting in chair on 3 L O2 via nasal cannula.  Pt educated on spinal precautions (recommend continued review).  Pt c/o "twinge" in mid/low back (pain) during session but overall no other c/o pain.  Overall pt demonstrating generalized weakness.  Pt would benefit from skilled PT to address noted impairments and functional limitations (see below for any additional details).  Upon hospital discharge, currently recommend pt discharge to STR.    Follow Up Recommendations SNF    Equipment Recommendations  Rolling walker with 5" wheels    Recommendations for Other Services       Precautions / Restrictions Precautions Precautions: Fall Precaution Comments: Spinal precautions.  Per MD Allena KatzPatel note 10/14/17:  "hold off on using the brace".    Required Braces or Orthoses: Spinal Brace Spinal Brace: Thoracolumbosacral orthotic Restrictions Weight Bearing Restrictions: No      Mobility  Bed Mobility Overal bed mobility: Needs Assistance Bed Mobility: Supine to Sit     Supine to sit: Mod assist;HOB elevated     General bed mobility comments: vc's and assist for logrolling and for trunk to sit up; pt able to scoot forward with increased effort and time  Transfers Overall transfer level: Needs assistance Equipment used: Rolling walker (2 wheeled) Transfers: Sit to/from Stand Sit to Stand: Min assist         General transfer comment: assist to initiate and come to full stand from bed; CGA for safety from chair  Ambulation/Gait Ambulation/Gait assistance: Min guard;Min assist Ambulation Distance (Feet): 5 Feet Assistive device: Rolling walker (2 wheeled)   Gait velocity: decreased   General Gait Details: decreased B step length/foot clearance/heelstrike; significant extra time and effort to walk short distance from bed to recliner; intermittently unsteady requiring assist for balance  Stairs            Wheelchair Mobility    Modified Rankin (Stroke Patients Only)       Balance Overall balance assessment: Needs assistance Sitting-balance support: Feet supported;Single extremity supported Sitting balance-Leahy Scale: Poor Sitting balance - Comments: requires at least single UE support for static sitting balance   Standing balance support: Bilateral upper extremity supported Standing balance-Leahy Scale: Poor Standing balance comment: pt requires B UE support for static standing balance  Pertinent Vitals/Pain Pain Assessment: Faces Faces Pain Scale: Hurts a little bit Pain Location: mid to low back Pain Descriptors / Indicators: ("twinge" when moving) Pain Intervention(s): Limited activity within patient's tolerance;Monitored during session;Repositioned   HR WFL during session.    Home Living Family/patient expects to be discharged to:: Assisted living               Home Equipment: Walker - 4 wheels Additional Comments: Lives at [facility] independent living.    Prior Function Level of Independence: Needs assistance   Gait / Transfers Assistance Needed: Pt reports being independent with ambulation using rollator (walks to dining hall for meals).  ADL's / Homemaking Assistance Needed: Has assist for cleaning 1x/week; son prepares pt's medications via pill box each week.  Pt reports performing own showers.        Hand Dominance        Extremity/Trunk Assessment   Upper Extremity Assessment Upper Extremity Assessment: Generalized weakness    Lower Extremity Assessment Lower Extremity Assessment: Generalized weakness(AROM B LE hip flexion, knee flexion/extension, and DF/PF at least 3+/5)    Cervical / Trunk Assessment Cervical / Trunk Assessment: Kyphotic  Communication   Communication: HOH  Cognition Arousal/Alertness: Awake/alert Behavior During Therapy: WFL for tasks assessed/performed Overall Cognitive Status: Within Functional Limits for tasks assessed(Oriented to person, place, time (day of week and year), and situation)                                        General Comments General comments (skin integrity, edema, etc.): Pt resting in bed watching tv upon PT arrival.  MD Allena Katz called PT AM 10/14/17 to see pt today.  Pt agreeable to PT evaluation.    Exercises  Bed mobility and transfer training.   Assessment/Plan    PT Assessment Patient needs continued PT services  PT Problem List Decreased strength;Decreased activity tolerance;Decreased balance;Decreased mobility;Decreased knowledge of precautions;Pain       PT Treatment Interventions DME instruction;Gait training;Balance training;Functional mobility training;Therapeutic activities;Therapeutic exercise;Patient/family education    PT  Goals (Current goals can be found in the Care Plan section)  Acute Rehab PT Goals Patient Stated Goal: to be able to walk more PT Goal Formulation: With patient Time For Goal Achievement: 10/28/17 Potential to Achieve Goals: Good    Frequency 7X/week   Barriers to discharge Decreased caregiver support      Co-evaluation               AM-PAC PT "6 Clicks" Daily Activity  Outcome Measure Difficulty turning over in bed (including adjusting bedclothes, sheets and blankets)?: Unable Difficulty moving from lying on back to sitting on the side of the bed? : Unable Difficulty sitting down on and standing up from a chair with arms (e.g., wheelchair, bedside commode, etc,.)?: Unable Help needed moving to and from a bed to chair (including a wheelchair)?: A Little Help needed walking in hospital room?: A Little Help needed climbing 3-5 steps with a railing? : A Lot 6 Click Score: 11    End of Session Equipment Utilized During Treatment: Gait belt Activity Tolerance: Other (comment)(Limited d/t SOB with activity) Patient left: in chair;with call bell/phone within reach;with chair alarm set(respiratory in room) Nurse Communication: Mobility status;Precautions;Other (comment)(Pt's O2 status during session) PT Visit Diagnosis: Unsteadiness on feet (R26.81);Other abnormalities of gait and mobility (R26.89);Muscle weakness (generalized) (M62.81);History of falling (Z91.81)    Time: 1610-9604  PT Time Calculation (min) (ACUTE ONLY): 43 min   Charges:   PT Evaluation $PT Eval Low Complexity: 1 Low PT Treatments $Therapeutic Activity: 8-22 mins   PT G Codes:   PT G-Codes **NOT FOR INPATIENT CLASS** Functional Assessment Tool Used: AM-PAC 6 Clicks Basic Mobility Functional Limitation: Mobility: Walking and moving around Mobility: Walking and Moving Around Current Status (R6045): At least 60 percent but less than 80 percent impaired, limited or restricted Mobility: Walking and Moving  Around Goal Status [phone]): At least 1 percent but less than 20 percent impaired, limited or restricted    Hendricks Limes, PT 10/14/17, 11:47 AM [phone]

## 2017-10-14 NOTE — Progress Notes (Signed)
PT is recommending SNF. Clinical Social Worker (CSW) contacted patient's son [name] and made him aware of above. Son requested for patient to go [facility]. [facility] on Saturday 10/16/17 pending medical clearance. Son is in agreement with plan. MD is aware of above.   [employer] Tatisha Cerino, LCSW [phone]

## 2017-10-14 NOTE — Progress Notes (Signed)
[facility] [place] [facility]   PATIENT NAME: Kelly OatsHilda Booth    MR#:  [id number]  DATE OF BIRTH:  08/05/1920  SUBJECTIVE:   Pt got agitated last nite. Pulled IV ut. Hard on hearing Breathing much better. sats 98% on NRB REVIEW OF SYSTEMS:   Review of Systems  Constitutional: Negative for chills, fever and weight loss.  HENT: Negative for ear discharge, ear pain and nosebleeds.   Eyes: Negative for blurred vision, pain and discharge.  Respiratory: Negative for sputum production, shortness of breath, wheezing and stridor.   Cardiovascular: Negative for chest pain, palpitations, orthopnea and PND.  Gastrointestinal: Negative for abdominal pain, diarrhea, nausea and vomiting.  Genitourinary: Negative for frequency and urgency.  Musculoskeletal: Positive for back pain. Negative for joint pain.  Neurological: Positive for weakness. Negative for sensory change, speech change and focal weakness.  Psychiatric/Behavioral: Negative for depression and hallucinations. The patient is not nervous/anxious.    Tolerating Diet:yes Tolerating PT: pending  DRUG ALLERGIES:   Allergies  Allergen Reactions  . Ceclor [Cefaclor]     Passed out     VITALS:  Blood pressure 132/76, pulse 76, temperature 98.2 F (36.8 C), temperature source Axillary, resp. rate 18, height 4\' 10"  (1.473 m), weight 43.1 kg (95 lb), SpO2 90 %.  PHYSICAL EXAMINATION:   Physical Exam  GENERAL:  84 y.o.-year-old patient lying in the bed with mild to moderate acute respiratory distress.  Thin cachectic and frail EYES: Pupils equal, round, reactive to light and accommodation. No scleral icterus. HEENT: Head atraumatic, normocephalic. Oropharynx and nasopharynx clear.  NECK:  Supple, no jugular venous distention. No thyroid enlargement, no tenderness.  LUNGS: coarse breath sounds bilaterally, no wheezing, rales, rhonchi. ++ use of accessory muscles of respiration.  CARDIOVASCULAR: S1, S2 normal.  No murmurs, rubs, or gallops.  ABDOMEN: Soft, nontender, nondistended. Bowel sounds present. No organomegaly or mass.  EXTREMITIES: No cyanosis, clubbing or edema b/l.    NEUROLOGIC: Cranial nerves II through XII are intact. No focal Motor or sensory deficits b/l.   PSYCHIATRIC:  patient is alert and awake SKIN: No obvious rash, lesion, or ulcer.   LABORATORY PANEL:  CBC Recent Labs  Lab 10/12/17 1637  WBC 10.4  HGB 11.8*  HCT 33.6*  PLT 209    Chemistries  Recent Labs  Lab 10/12/17 1637  NA 140  K 3.8  CL 99*  CO2 32  GLUCOSE 136*  BUN 29*  CREATININE 1.21*  CALCIUM 9.1  AST 26  ALT 15  ALKPHOS 65  BILITOT 0.5   Cardiac Enzymes Recent Labs  Lab 10/12/17 1637  TROPONINI <0.03   RADIOLOGY:  Dg Chest 2 View  Result Date: 10/12/2017 CLINICAL DATA:  Unwitnessed fall. Back pain. Atrial fibrillation and pulmonary fibrosis. EXAM: CHEST  2 VIEW COMPARISON:  06/09/2016 FINDINGS: Chronic cardiomegaly and aortic atherosclerosis. Chronic fibrotic changes of the lungs. Bilateral effusions with dependent atelectasis. Probable mild pulmonary edema. Multiple thoracic compression fractures uncertain age. Near complete collapse T12. multiple old healed left-sided rib fractures. IMPRESSION: Congestive heart failure. Edema with effusions and basilar atelectasis. Multiple thoracic compression fractures, age indeterminate. T12 fracture shows vertebra plana. Electronically Signed   By: Paulina FusiMark  Shogry M.D.   On: 10/12/2017 17:18   Dg Lumbar Spine Complete  Result Date: 10/12/2017 CLINICAL DATA:  [name].  Back pain. EXAM: LUMBAR SPINE - COMPLETE 4+ VIEW COMPARISON:  CT abdomen 06/05/2016 FINDINGS: Old partial compression fracture at L3. T12 compression fracture with vertebra plana as noted  at chest radiography, not present in September of 2017 but age indeterminate. Chronic degenerative disc disease and degenerative facet disease in the lumbar region. IMPRESSION: Old compression fracture at L3.  Vertebra plana compression fracture at T12, not present in September of 2017, but exact age indeterminate. Electronically Signed   By: Paulina Fusi M.D.   On: 10/12/2017 17:21   ASSESSMENT AND PLAN:  Kelly Booth  is a 84 y.o. female with a known history of A. fib, glaucoma, hypertension and osteoporosis comes in from [facility] after she had an unwitnessed fall.  Patient complained of back pain.  She was found to have T12 compression fracture on x-rays.  1.  Acute hypoxic respiratory failure secondary to inability to tolerate vest/brace.  Patient has history of underlying chronic bilateral pulmonary fibrosis -Patient was doing well prior to putting the brace.  Sats dropped down in the 70s.  - She is currently on nonrebreather with sats around 99% -wean to [place]/RA -PT to see pt today -I will hold off on using the brace.  2. T12 vertebral compression fracture status post unwitnessed fall at [facility] -Oral pain medication as needed -Lidocaine patch if needed -Neurosurgery Dr. Adriana Simas recommended lower back brace/vest if patient able to tolerate -Physical therapy to see patient  3. Hypertension, uncontrolled Continue home meds PRN IV hydralazine  3.  Glaucoma continue eyedrops  4.  History of osteoporosis continue calcium with vitamin D  5.  DVT prophylaxis subcu Lovenox  D/c planning pending PT evaluation. Rehab vs [facility] with PT  Case discussed with Care Management/Social Worker. Management plans discussed with the patient, family and they are in agreement.  CODE STATUS: DNr  DVT Prophylaxis: lovenox  TOTAL TIME TAKING CARE OF THIS PATIENT: *25* minutes.  >50% time spent on counselling and coordination of care  POSSIBLE D/C IN few DAYS, DEPENDING ON CLINICAL CONDITION.  Note: This dictation was prepared with Dragon dictation along with smaller phrase technology. Any transcriptional errors that result from this process are unintentional.  Enedina Finner M.D on 10/14/2017 at  7:17 AM  Between 7am to 6pm - Pager - [phone]  After 6pm go to [email or web address] - Social research officer, government  [facility]  Office  [phone]  CC: Primary care physician; [place], KernodlePatient ID: [name], female   DOB: [birth year], 84 y.o.   MRN: [id number]

## 2017-10-14 NOTE — Progress Notes (Signed)
Pnt agitated overnight. Pnt refused pain medication and has pulled PIV out x2. Pnt currently refuses nurse to place another. Pnt combative when attempting to look for vein. Large hematoma at previous PIV site. Compression applied and bandage to right wrist and lower arm. Bed alarm active and on. Pnt safe and no injury or fall at this time in shift. Pnt adjusted in bed and support offered. Pnt has not appeared to be in any respiratory distress. Reapplied oxygen pnt removes after a few minutes on RA pnt sat 90%. Bed low and locked, call bell in reach will continue to monitor and assess.

## 2017-10-15 LAB — CREATININE, SERUM
Creatinine, Ser: 1.2 mg/dL — ABNORMAL HIGH (ref 0.44–1.00)
GFR, EST AFRICAN AMERICAN: 43 mL/min — AB (ref >60)
GFR, EST NON AFRICAN AMERICAN: 37 mL/min — AB (ref >60)

## 2017-10-15 MED ORDER — IPRATROPIUM-ALBUTEROL 0.5-2.5 (3) MG/3ML IN SOLN
3.0000 mL | Freq: Two times a day (BID) | RESPIRATORY_TRACT | Status: DC
  Administered 2017-10-15 – 2017-10-16 (×2): 3 mL via RESPIRATORY_TRACT
  Filled 2017-10-15 (×2): qty 3

## 2017-10-15 MED ORDER — ALBUTEROL SULFATE (2.5 MG/3ML) 0.083% IN NEBU: 2.5000 mg | INHALATION_SOLUTION | Freq: Four times a day (QID) | RESPIRATORY_TRACT | 12 refills | Status: AC | PRN

## 2017-10-15 MED ORDER — BISACODYL 10 MG RE SUPP
10.0000 mg | Freq: Once | RECTAL | Status: AC
  Administered 2017-10-15: 10 mg via RECTAL
  Filled 2017-10-15: qty 1

## 2017-10-15 NOTE — Progress Notes (Signed)
[facility] [place] at [facility]   PATIENT NAME: Kelly Booth    MR#:  [id number]  DATE OF BIRTH:  01/09/1920  SUBJECTIVE:   Patient doing much better.  Appears to be near baseline.  Grandson in the room.  8 decent breakfast this morning.  No other issues. REVIEW OF SYSTEMS:   Review of Systems  Constitutional: Negative for chills, fever and weight loss.  HENT: Negative for ear discharge, ear pain and nosebleeds.   Eyes: Negative for blurred vision, pain and discharge.  Respiratory: Negative for sputum production, shortness of breath, wheezing and stridor.   Cardiovascular: Negative for chest pain, palpitations, orthopnea and PND.  Gastrointestinal: Negative for abdominal pain, diarrhea, nausea and vomiting.  Genitourinary: Negative for frequency and urgency.  Musculoskeletal: Positive for back pain. Negative for joint pain.  Neurological: Positive for weakness. Negative for sensory change, speech change and focal weakness.  Psychiatric/Behavioral: Negative for depression and hallucinations. The patient is not nervous/anxious.    Tolerating Diet:yes Tolerating PT rehab  DRUG ALLERGIES:   Allergies  Allergen Reactions  . Ceclor [Cefaclor]     Passed out     VITALS:  Blood pressure 132/67, pulse 76, temperature 98.7 F (37.1 C), temperature source Oral, resp. rate 20, height 4\' 10"  (1.473 m), weight 43.1 kg (95 lb), SpO2 92 %.  PHYSICAL EXAMINATION:   Physical Exam  GENERAL:  84 y.o.-year-old patient lying in the bed with mild to moderate acute respiratory distress.  Thin cachectic and frail EYES: Pupils equal, round, reactive to light and accommodation. No scleral icterus. HEENT: Head atraumatic, normocephalic. Oropharynx and nasopharynx clear.  NECK:  Supple, no jugular venous distention. No thyroid enlargement, no tenderness.  LUNGS: coarse breath sounds bilaterally, no wheezing, rales, rhonchi. ++ use of accessory muscles of respiration.   CARDIOVASCULAR: S1, S2 normal. No murmurs, rubs, or gallops.  ABDOMEN: Soft, nontender, nondistended. Bowel sounds present. No organomegaly or mass.  EXTREMITIES: No cyanosis, clubbing or edema b/l.    NEUROLOGIC: Cranial nerves II through XII are intact. No focal Motor or sensory deficits b/l.   PSYCHIATRIC:  patient is alert and awake SKIN: No obvious rash, lesion, or ulcer.   LABORATORY PANEL:  CBC Recent Labs  Lab 10/12/17 1637  WBC 10.4  HGB 11.8*  HCT 33.6*  PLT 209    Chemistries  Recent Labs  Lab 10/12/17 1637  NA 140  K 3.8  CL 99*  CO2 32  GLUCOSE 136*  BUN 29*  CREATININE 1.21*  CALCIUM 9.1  AST 26  ALT 15  ALKPHOS 65  BILITOT 0.5   Cardiac Enzymes Recent Labs  Lab 10/12/17 1637  TROPONINI <0.03   RADIOLOGY:  No results found. ASSESSMENT AND PLAN:  Kelly Booth  is a 84 y.o. female with a known history of A. fib, glaucoma, hypertension and osteoporosis comes in from [facility] after she had an unwitnessed fall.  Patient complained of back pain.  She was found to have T12 compression fracture on x-rays.  1.  Acute hypoxic respiratory failure secondary to inability to tolerate vest/brace.  Patient has history of underlying chronic bilateral pulmonary fibrosis -Patient was doing well prior to putting the brace.  Sats dropped down in the 70s.  - She is currently on nonrebreather with sats around 99% -weaned to RA.  Patient did qualify for home oxygen. -PT evaluation noted.  Given comorbidities recent fall and fracture patient will benefit from rehab -I will hold off on using the brace.  2. T12 vertebral compression fracture status [facility] -Oral pain medication as needed -Lidocaine patch if needed -Neurosurgery Dr. Adriana Simasook recommended lower back brace/vest if patient able to tolerate -Appreciate PT input  3. Hypertension, uncontrolled Continue home meds PRN IV hydralazine  3.  Glaucoma continue eyedrops  4.  History  of osteoporosis  -continue calcium with vitamin D  5.  DVT prophylaxis subcu Lovenox  DC to [facility] tomorrow  Case discussed with Care Management/Social Worker.   CODE STATUS: DNR  DVT Prophylaxis: lovenox  TOTAL TIME TAKING CARE OF THIS PATIENT: *25* minutes.  >50% time spent on counselling and coordination of care  POSSIBLE D/C IN few DAYS, DEPENDING ON CLINICAL CONDITION.  Note: This dictation was prepared with Dragon dictation along with smaller phrase technology. Any transcriptional errors that result from this process are unintentional.  Enedina FinnerSona Tia Gelb M.D on 10/15/2017 at 10:45 AM  Between 7am to 6pm - Pager - [phone]  After 6pm go to [email or web address] - Social research officer, governmentpassword EPAS [facility]  [facility] [place] [facility]  Office  [phone]  CC: Primary care physician; [facility]Patient ID: Kelly ShaggyHilda C Booth, female   DOB: 12/26/1919, 84 y.o.   MRN: [id number]

## 2017-10-15 NOTE — Progress Notes (Signed)
Physical Therapy Treatment Patient Details Name: Kelly ShaggyHilda C Booth MRN: [id number] DOB: 04/29/1920 Today's Date: 10/15/2017    History of Present Illness Pt is a 84 y.o. female presenting to hospital s/p mechanical fall with moderate to severe LBP; pt with severe kyphosis baseline.  Imaging showing old compression fx L3 and newer vertebra plana compression fx T12.  Neurosurgery recommending pain control, PT eval, and brace if pt able to tolerate.  After putting on the brace 10/13/17 pt with respiratory issues.  PMH includes a-fib, COPD, pulmonary fibrosis, CHF, and THA.      PT Comments    Pt's O2 sats 91% on 2 L O2 via nasal cannula at rest beginning of session; after sitting on edge of bed pt's O2 sats decreased to 85% (unable to increase  O2 sats with pursed lip breathing) so therapist increased O2 and O2 sats increased to 92% (O2 increased to 3 L/min via nasal cannula for rest of therapy session).  After standing and walking a short distance bed to recliner pt noted with mild SOB and O2 sats 85% again (after a couple minutes of pursed lip breathing O2 increased to 93% at rest); nursing notified of above O2 sats and that pt was on 3 L.  Overall pt appearing stronger in standing and taking steps today but did demonstrate posterior lean standing (requiring assist to shift weight forward for balance).  Will continue to progress pt with strengthening, balance, and progressive functional mobility per pt tolerance.    Follow Up Recommendations  SNF     Equipment Recommendations  Rolling walker with 5" wheels    Recommendations for Other Services       Precautions / Restrictions Precautions Precautions: Fall Precaution Comments: Spinal precautions.  Per MD Allena KatzPatel note 10/14/17:  "hold off on using the brace".   Required Braces or Orthoses: Spinal Brace Spinal Brace: Thoracolumbosacral orthotic Restrictions Weight Bearing Restrictions: No    Mobility  Bed Mobility Overal bed mobility: Needs  Assistance Bed Mobility: Supine to Sit     Supine to sit: Mod assist;HOB elevated     General bed mobility comments: vc's and assist for logrolling and for trunk to sit up; pt able to scoot forward with increased effort and time  Transfers Overall transfer level: Needs assistance Equipment used: Rolling walker (2 wheeled) Transfers: Sit to/from Stand Sit to Stand: Min assist         General transfer comment: assist to initiate and come to full stand from bed (posterior lean initially upon standing requiring assist to shift pt's weight forward)  Ambulation/Gait Ambulation/Gait assistance: Min assist Ambulation Distance (Feet): 5 Feet Assistive device: Rolling walker (2 wheeled)   Gait velocity: decreased   General Gait Details: decreased B step length/foot clearance/heelstrike; extra time and effort to walk short distance from bed to recliner; intermittently unsteady requiring assist for balance   Stairs            Wheelchair Mobility    Modified Rankin (Stroke Patients Only)       Balance Overall balance assessment: Needs assistance Sitting-balance support: Feet supported;Single extremity supported Sitting balance-Leahy Scale: Poor Sitting balance - Comments: requires at least single UE support for static sitting balance   Standing balance support: Bilateral upper extremity supported Standing balance-Leahy Scale: Poor Standing balance comment: pt requires B UE support for static standing balance; posterior lean initially upon standing requiring assist to shift weight forward  Cognition Arousal/Alertness: Awake/alert Behavior During Therapy: WFL for tasks assessed/performed Overall Cognitive Status: Within Functional Limits for tasks assessed                                        Exercises      General Comments General comments (skin integrity, edema, etc.): Pt resting in bed upon PT arrival.   Nursing cleared pt for participation in physical therapy.  Pt agreeable to PT session.      Pertinent Vitals/Pain Pain Assessment: No/denies pain Pain Score: 0-No pain Pain Intervention(s): Limited activity within patient's tolerance;Monitored during session;Repositioned  HR WFL during session.    Home Living                      Prior Function            PT Goals (current goals can now be found in the care plan section) Acute Rehab PT Goals Patient Stated Goal: to be able to walk more PT Goal Formulation: With patient Time For Goal Achievement: 10/28/17 Potential to Achieve Goals: Good Progress towards PT goals: Progressing toward goals    Frequency    7X/week      PT Plan Current plan remains appropriate    Co-evaluation              AM-PAC PT "6 Clicks" Daily Activity  Outcome Measure  Difficulty turning over in bed (including adjusting bedclothes, sheets and blankets)?: Unable Difficulty moving from lying on back to sitting on the side of the bed? : Unable Difficulty sitting down on and standing up from a chair with arms (e.g., wheelchair, bedside commode, etc,.)?: Unable Help needed moving to and from a bed to chair (including a wheelchair)?: A Little Help needed walking in hospital room?: A Little Help needed climbing 3-5 steps with a railing? : A Lot 6 Click Score: 11    End of Session Equipment Utilized During Treatment: Gait belt Activity Tolerance: Other (comment)(Limited d/t SOB and O2 desaturation with activity) Patient left: in chair;with call bell/phone within reach;with chair alarm set;Other (comment)(B heels elevated via pillow) Nurse Communication: Mobility status;Precautions;Other (comment)(Pt's O2 status during session and need to increased O2 to 3 L to get O2 sats in 90's) PT Visit Diagnosis: Unsteadiness on feet (R26.81);Other abnormalities of gait and mobility (R26.89);Muscle weakness (generalized) (M62.81);History of falling  (Z91.81)     Time: 1610-9604 PT Time Calculation (min) (ACUTE ONLY): 26 min  Charges:  $Therapeutic Activity: 23-37 mins                    G CodesHendricks Limes, PT 10/15/17, 12:02 PM [phone]

## 2017-10-15 NOTE — Progress Notes (Signed)
Patient will D/C to [facility] tomorrow pending medical clearance. Clinical Child psychotherapistocial Worker (CSW) sent [facility] today via [facility]. [facility] can come tomorrow.   Baker Hughes IncorporatedBailey Klint Lezcano, LCSW [phone]

## 2017-10-15 NOTE — Discharge Summary (Addendum)
[facility] - [place] at [facility]   PATIENT NAME: Kelly Booth    MR#:  [id number]  DATE OF BIRTH:  25-Apr-1920  DATE OF ADMISSION:  10/12/2017 ADMITTING PHYSICIAN: Enedina Finner, MD  DATE OF DISCHARGE: Patient will likely discharge tomorrow to rehab  PRIMARY CARE PHYSICIAN: [facility]    ADMISSION DIAGNOSIS:  Compression fracture of body of thoracic vertebra (HCC) [S22.000A]  DISCHARGE DIAGNOSIS:  Compression fracture T12 vertebrae status post mechanical fall Acute on chronic hypoxic respiratory failure secondary to severe pulmonary fibrosis Hypoxia now requiring oxygen SECONDARY DIAGNOSIS:   Past Medical History:  Diagnosis Date  . A-fib (HCC)   . Allergic rhinitis   . Constipation   . Dyspepsia   . Esophageal reflux   . Generalized weakness   . Glaucoma   . Hypertension   . Osteoporosis   . Pulmonary fibrosis (HCC)    There is no evidence of pulmonary fibrosis on CT chest    HOSPITAL COURSE:   HildaMauneyis a89 y.o.femalewith a known history of A. fib, glaucoma, hypertension and osteoporosis [facility]. Patient complained of back pain. She was found to have T12 compression fracture on x-rays.  1.  Acute hypoxic respiratory failure secondary to inability to tolerate vest/brace.  Patient has history of underlying chronic bilateral pulmonary fibrosis -Patient was doing well prior to putting the brace.  Sats dropped down in the 70s.  - She is currently on 2 L [place] and O2 sats stable.   -  Patient did qualify for home oxygen. - PT evaluation noted.  Given comorbidities recent fall and fracture patient will benefit from reha  2. T12 vertebral compression fracture status post unwitnessed fall at [facility] - pain much improved with Oral Tramadol and currently not requiring any pain meds.   -Neurosurgery Dr. Adriana Simas recommended lower back brace/vest but pt. Could not tolerate it.  - seen  by PT and will d/c to SNF today.   3. Hypertension - well controlled now.  - cont. Norvasc, Metoprolol.   4. Glaucoma - pt. Will cont. Her Latanoprost eye drops.   5. History of osteoporosis  - she will continue calcium with vitamin D  6. GERD - she will cont. Her Protonix.   D/c to [facility] today.   CONSULTS OBTAINED:    DRUG ALLERGIES:   Allergies  Allergen Reactions  . Ceclor [Cefaclor]     Passed out     DISCHARGE MEDICATIONS:   Allergies as of 10/15/2017      Reactions   Ceclor [cefaclor]    Passed out       Medication List    TAKE these medications   albuterol (2.5 MG/3ML) 0.083% nebulizer solution Commonly known as:  PROVENTIL Take 3 mLs (2.5 mg total) by nebulization every 6 (six) hours as needed for wheezing or shortness of breath.   amLODipine 2.5 MG tablet Commonly known as:  NORVASC Take 1 tablet by mouth daily.   ARTIFICIAL TEARS 1.4 % ophthalmic solution Generic drug:  polyvinyl alcohol Apply 1 drop to eye 4 (four) times daily as needed.   calcium-vitamin D 500-200 MG-UNIT tablet Take 1 tablet by mouth 2 (two) times daily.   ferrous sulfate 325 (65 FE) MG tablet Take 1 tablet by mouth daily.   furosemide 40 MG tablet Commonly known as:  LASIX Take 40 mg by mouth daily.   Ipratropium-Albuterol 20-100 MCG/ACT Aers respimat Commonly known as:  COMBIVENT Inhale 1 puff into the  lungs 4 (four) times daily.   latanoprost 0.005 % ophthalmic solution Commonly known as:  XALATAN Apply 1 drop to eye at bedtime.   metoprolol tartrate 25 MG tablet Commonly known as:  LOPRESSOR Take 0.5 tablets (12.5 mg total) by mouth 2 (two) times daily.   pantoprazole 40 MG tablet Commonly known as:  PROTONIX Take 1 tablet (40 mg total) by mouth 2 (two) times daily before a meal.   ranitidine 150 MG tablet Commonly known as:  ZANTAC Take 1 tablet by mouth at bedtime.   STIMULANT LAXATIVE 5 MG EC tablet Generic drug:  bisacodyl Take 5 mg by mouth  2 (two) times daily.   Vitamin D3 1000 units Caps Take 1 capsule by mouth daily.       If you experience worsening of your admission symptoms, develop shortness of breath, life threatening emergency, suicidal or homicidal thoughts you must seek medical attention immediately by calling 911 or calling your MD immediately  if symptoms less severe.  You Must read complete instructions/literature along with all the possible adverse reactions/side effects for all the Medicines you take and that have been prescribed to you. Take any new Medicines after you have completely understood and accept all the possible adverse reactions/side effects.   No complaints this a.m. Son at bedside.   GENERAL:  84 y.o.-year-old thin frail patient lying in the bed in NAD.  EYES: Pupils equal, round, reactive to light and accommodation. No scleral icterus.  HEENT: Head atraumatic, normocephalic. Oropharynx and nasopharynx clear.  NECK:  Supple, no jugular venous distention. No thyroid enlargement, no tenderness.  LUNGS: coarse breath sounds bilaterally, no wheezing, rales, rhonchi. ()- use of accessory muscles of respiration.  CARDIOVASCULAR: S1, S2 normal. II/VI SEM at RSB, No rubs, or gallops.  ABDOMEN: Soft, nontender, nondistended. Bowel sounds present. No organomegaly or mass.  EXTREMITIES: No cyanosis, clubbing or edema b/l.    NEUROLOGIC: Cranial nerves II through XII are intact. No focal Motor or sensory deficits b/l. Globally weak.   PSYCHIATRIC:  patient is alert and awake. Good affect.  SKIN: No obvious rash, lesion, or ulcer    DATA REVIEW:   CBC  Recent Labs  Lab 10/12/17 1637  WBC 10.4  HGB 11.8*  HCT 33.6*  PLT 209    Chemistries  Recent Labs  Lab 10/12/17 1637  NA 140  K 3.8  CL 99*  CO2 32  GLUCOSE 136*  BUN 29*  CREATININE 1.21*  CALCIUM 9.1  AST 26  ALT 15  ALKPHOS 65  BILITOT 0.5    Microbiology Results   Recent Results (from the past 240 hour(s))  MRSA PCR  Screening     Status: None   Collection Time: 10/13/17 12:37 PM  Result Value Ref Range Status   MRSA by PCR NEGATIVE NEGATIVE Final    Comment:        The GeneXpert MRSA Assay (FDA approved for NASAL specimens only), is one component of a comprehensive MRSA colonization surveillance program. It is not intended to diagnose MRSA infection nor to guide or monitor treatment for MRSA infections. Performed at [facility], [address] [place], [place] [address]     RADIOLOGY:  No results found.   Management plans discussed with the patient, family and they are in agreement.  CODE STATUS:     Code Status Orders  (From admission, onward)        Start     Ordered   10/12/17 2104  Do not  attempt resuscitation (DNR)  Continuous    Question Answer Comment  In the event of cardiac or respiratory ARREST Do not call a "code blue"   In the event of cardiac or respiratory ARREST Do not perform Intubation, CPR, defibrillation or ACLS   In the event of cardiac or respiratory ARREST Use medication by any route, position, wound care, and other measures to relive pain and suffering. May use oxygen, suction and manual treatment of airway obstruction as needed for comfort.      10/12/17 2103    Code Status History    Date Active Date Inactive Code Status Order ID Comments User Context   06/05/2016 23:45 06/07/2016 09:38 DNR [id number]  Tonye Royalty, DO Inpatient    TOTAL TIME TAKING CARE OF THIS PATIENT: 40 minutes.    Mikiala Lias M.D on 10/16/2017 at 10:23 AM  Between 7am to 6pm - Pager - [phone] After 6pm go to [email or web address] - Social research officer, government  [facility] [place] [facility]  Office  [phone]  CC: Primary care physician; Raynelle Bring

## 2017-10-16 NOTE — Progress Notes (Signed)
Physical Therapy Treatment Patient Details Name: Kelly ShaggyHilda C Rasheed MRN: [id number] DOB: 01/31/1920 Today's Date: 10/16/2017    History of Present Illness Pt is a 84 y.o. female presenting to hospital s/p mechanical fall with moderate to severe LBP; pt with severe kyphosis baseline.  Imaging showing old compression fx L3 and newer vertebra plana compression fx T12.  Neurosurgery recommending pain control, PT eval, and brace if pt able to tolerate.  After putting on the brace 10/13/17 pt with respiratory issues.  PMH includes a-fib, COPD, pulmonary fibrosis, CHF, and THA.      PT Comments    Pt in bed, ready for session.  Son in attendance.  Pt on 1 lpm O2 at rest in bed.  Sats noted to bed 86-88%.  Discussed with RN who stated she was recently reduced to 1 lpm.  OK to increase back to 2 lpm where she increased to 92-94% at rest.  To edge of bed with rail and min guard with verbal cues for log rolling to protect spine.  Brace left off per MD note.  She was able to stand and ambulate to door and back with walker and min assist.  Upon sitting in chair, sats noted to be 78% on 2 lpm.  Proper breathing techniques encouraged and given time, they increased back to 91% at rest.  Pt remained up in recliner.   Follow Up Recommendations  SNF     Equipment Recommendations       Recommendations for Other Services       Precautions / Restrictions Precautions Precautions: Fall Precaution Comments: Spinal precautions.  Per MD Allena KatzPatel note 10/14/17:  "hold off on using the brace".   Restrictions Weight Bearing Restrictions: No    Mobility  Bed Mobility Overal bed mobility: Needs Assistance Bed Mobility: Supine to Sit     Supine to sit: Min guard        Transfers Overall transfer level: Needs assistance Equipment used: Rolling walker (2 wheeled) Transfers: Sit to/from Stand Sit to Stand: Min assist            Ambulation/Gait Ambulation/Gait assistance: Min assist Ambulation Distance (Feet):  25 Feet Assistive device: Rolling walker (2 wheeled) Gait Pattern/deviations: Step-through pattern;Narrow base of support;Decreased step length - right;Decreased step length - left;Staggering left;Staggering right Gait velocity: decreased Gait velocity interpretation: Below normal speed for age/gender     Stairs            Wheelchair Mobility    Modified Rankin (Stroke Patients Only)       Balance Overall balance assessment: Needs assistance Sitting-balance support: Feet supported;Single extremity supported Sitting balance-Leahy Scale: Fair     Standing balance support: Bilateral upper extremity supported Standing balance-Leahy Scale: Poor                              Cognition Arousal/Alertness: Awake/alert Behavior During Therapy: WFL for tasks assessed/performed Overall Cognitive Status: Within Functional Limits for tasks assessed                                        Exercises      General Comments        Pertinent Vitals/Pain Pain Assessment: No/denies pain Pain Intervention(s): Limited activity within patient's tolerance    Home Living  Prior Function            PT Goals (current goals can now be found in the care plan section) Progress towards PT goals: Progressing toward goals    Frequency    7X/week      PT Plan Current plan remains appropriate    Co-evaluation              AM-PAC PT "6 Clicks" Daily Activity  Outcome Measure  Difficulty turning over in bed (including adjusting bedclothes, sheets and blankets)?: Unable Difficulty moving from lying on back to sitting on the side of the bed? : A Little Difficulty sitting down on and standing up from a chair with arms (e.g., wheelchair, bedside commode, etc,.)?: Unable Help needed moving to and from a bed to chair (including a wheelchair)?: A Little Help needed walking in hospital room?: A Little Help needed climbing  3-5 steps with a railing? : A Lot 6 Click Score: 13    End of Session Equipment Utilized During Treatment: Gait belt;Oxygen Activity Tolerance: Patient tolerated treatment well Patient left: in chair;with chair alarm set;with call bell/phone within reach;with family/visitor present Nurse Communication: Other (comment)       Time: 1610-9604 PT Time Calculation (min) (ACUTE ONLY): 24 min  Charges:  $Gait Training: 8-22 mins $Therapeutic Activity: 8-22 mins                    G Codes:       Danielle Dess, PTA 10/16/17, 11:02 AM

## 2017-10-16 NOTE — Progress Notes (Addendum)
Report called to [facility]. Amil AmenJulia accepting Charity fundraiserN. Updated with current patient status, including acute 2L O2, via [facility] called for nonemergent transport to facility. IV removed. Patient's son packed her personal belongings. Nurse aide changed patient's gown and obtained latest VS. Hospital documentation packet provided to EMS staff, including DNR form.

## 2017-10-16 NOTE — Clinical Social Work Note (Signed)
The patient will discharge today to [facility] via non-emergent EMS. The facility and family are aware and in agreement. CSW has sent all documentation to the facility. CSW is signing off. Please consult should additional needs arise.  Argentina PonderKaren Martha Glendale Wherry, MSW, Theresia MajorsLCSWA [phone]

## 2017-12-24 ENCOUNTER — Emergency Department: Payer: Medicare Other

## 2017-12-24 ENCOUNTER — Inpatient Hospital Stay
Admission: EM | Admit: 2017-12-24 | Discharge: 2017-12-27 | DRG: 871 | Disposition: A | Discharge status: Skilled Nursing Facility | Payer: Medicare Other | Attending: Internal Medicine | Admitting: Internal Medicine

## 2017-12-24 ENCOUNTER — Other Ambulatory Visit: Payer: Self-pay

## 2017-12-24 DIAGNOSIS — A4151 Sepsis due to Escherichia coli [E. coli]: Secondary | ICD-10-CM | POA: Diagnosis not present

## 2017-12-24 DIAGNOSIS — J309 Allergic rhinitis, unspecified: Secondary | ICD-10-CM | POA: Diagnosis present

## 2017-12-24 DIAGNOSIS — R0902 Hypoxemia: Secondary | ICD-10-CM | POA: Diagnosis present

## 2017-12-24 DIAGNOSIS — I10 Essential (primary) hypertension: Secondary | ICD-10-CM | POA: Diagnosis present

## 2017-12-24 DIAGNOSIS — G9341 Metabolic encephalopathy: Secondary | ICD-10-CM | POA: Diagnosis present

## 2017-12-24 DIAGNOSIS — H409 Unspecified glaucoma: Secondary | ICD-10-CM | POA: Diagnosis present

## 2017-12-24 DIAGNOSIS — M810 Age-related osteoporosis without current pathological fracture: Secondary | ICD-10-CM | POA: Diagnosis present

## 2017-12-24 DIAGNOSIS — Z8249 Family history of ischemic heart disease and other diseases of the circulatory system: Secondary | ICD-10-CM

## 2017-12-24 DIAGNOSIS — N3001 Acute cystitis with hematuria: Secondary | ICD-10-CM | POA: Diagnosis not present

## 2017-12-24 DIAGNOSIS — Z96649 Presence of unspecified artificial hip joint: Secondary | ICD-10-CM | POA: Diagnosis present

## 2017-12-24 DIAGNOSIS — F0390 Unspecified dementia without behavioral disturbance: Secondary | ICD-10-CM | POA: Diagnosis present

## 2017-12-24 DIAGNOSIS — E559 Vitamin D deficiency, unspecified: Secondary | ICD-10-CM | POA: Diagnosis present

## 2017-12-24 DIAGNOSIS — K219 Gastro-esophageal reflux disease without esophagitis: Secondary | ICD-10-CM | POA: Diagnosis present

## 2017-12-24 DIAGNOSIS — J8410 Pulmonary fibrosis, unspecified: Secondary | ICD-10-CM | POA: Diagnosis present

## 2017-12-24 DIAGNOSIS — Z66 Do not resuscitate: Secondary | ICD-10-CM | POA: Diagnosis present

## 2017-12-24 DIAGNOSIS — A419 Sepsis, unspecified organism: Secondary | ICD-10-CM

## 2017-12-24 LAB — LACTIC ACID, PLASMA
LACTIC ACID, VENOUS: 1.5 mmol/L (ref 0.5–1.9)
LACTIC ACID, VENOUS: 1.4 mmol/L (ref 0.5–1.9)

## 2017-12-24 LAB — URINALYSIS, COMPLETE (UACMP) WITH MICROSCOPIC
Bilirubin Urine: NEGATIVE
Glucose, UA: NEGATIVE mg/dL
Ketones, ur: NEGATIVE mg/dL
Nitrite: POSITIVE — AB
Protein, ur: 30 mg/dL — AB
SPECIFIC GRAVITY, URINE: 1.013 (ref 1.005–1.030)
pH: 7 (ref 5.0–8.0)

## 2017-12-24 LAB — COMPREHENSIVE METABOLIC PANEL
ALT: 10 U/L — ABNORMAL LOW (ref 14–54)
ANION GAP: 12 (ref 5–15)
AST: 22 U/L (ref 15–41)
Albumin: 3 g/dL — ABNORMAL LOW (ref 3.5–5.0)
Alkaline Phosphatase: 82 U/L (ref 38–126)
BILIRUBIN TOTAL: 0.8 mg/dL (ref 0.3–1.2)
BUN: 23 mg/dL — AB (ref 6–20)
CO2: 34 mmol/L — ABNORMAL HIGH (ref 22–32)
Calcium: 8.8 mg/dL — ABNORMAL LOW (ref 8.9–10.3)
Chloride: 94 mmol/L — ABNORMAL LOW (ref 101–111)
Creatinine, Ser: 0.89 mg/dL (ref 0.44–1.00)
GFR calc non Af Amer: 53 mL/min — ABNORMAL LOW (ref >60)
Glucose, Bld: 129 mg/dL — ABNORMAL HIGH (ref 65–99)
POTASSIUM: 3.8 mmol/L (ref 3.5–5.1)
Sodium: 140 mmol/L (ref 135–145)
TOTAL PROTEIN: 7.9 g/dL (ref 6.5–8.1)

## 2017-12-24 LAB — CBC
HCT: 30.9 % — ABNORMAL LOW (ref 35.0–47.0)
Hemoglobin: 10.9 g/dL — ABNORMAL LOW (ref 12.0–16.0)
MCH: 34.3 pg — AB (ref 26.0–34.0)
MCHC: 35.1 g/dL (ref 32.0–36.0)
MCV: 97.8 fL (ref 80.0–100.0)
Platelets: 285 10*3/uL (ref 150–440)
RBC: 3.16 MIL/uL — AB (ref 3.80–5.20)
RDW: 14.5 % (ref 11.5–14.5)
WBC: 13.5 10*3/uL — AB (ref 3.6–11.0)

## 2017-12-24 LAB — CBC WITH DIFFERENTIAL/PLATELET
BASOS PCT: 0 %
Basophils Absolute: 0 10*3/uL (ref 0–0.1)
Eosinophils Absolute: 0 10*3/uL (ref 0–0.7)
Eosinophils Relative: 0 %
HEMATOCRIT: 36.9 % (ref 35.0–47.0)
Hemoglobin: 12.3 g/dL (ref 12.0–16.0)
Lymphocytes Relative: 4 %
Lymphs Abs: 0.6 10*3/uL — ABNORMAL LOW (ref 1.0–3.6)
MCH: 33 pg (ref 26.0–34.0)
MCHC: 33.5 g/dL (ref 32.0–36.0)
MCV: 98.5 fL (ref 80.0–100.0)
MONO ABS: 0.3 10*3/uL (ref 0.2–0.9)
Monocytes Relative: 2 %
NEUTROS ABS: 13.3 10*3/uL — AB (ref 1.4–6.5)
Neutrophils Relative %: 94 %
PLATELETS: 283 10*3/uL (ref 150–440)
RBC: 3.74 MIL/uL — AB (ref 3.80–5.20)
RDW: 14.3 % (ref 11.5–14.5)
WBC: 14.3 10*3/uL — ABNORMAL HIGH (ref 3.6–11.0)

## 2017-12-24 LAB — CREATININE, SERUM
CREATININE: 0.85 mg/dL (ref 0.44–1.00)
GFR calc Af Amer: >60 mL/min (ref >60)
GFR calc non Af Amer: 56 mL/min — ABNORMAL LOW (ref >60)

## 2017-12-24 LAB — TROPONIN I: Troponin I: <0.03 ng/mL (ref <0.03)

## 2017-12-24 LAB — MRSA PCR SCREENING: MRSA BY PCR: NEGATIVE

## 2017-12-24 MED ORDER — SODIUM CHLORIDE 0.9 % IV SOLN: 1.0000 g | INTRAVENOUS | Status: DC

## 2017-12-24 MED ORDER — ACETAMINOPHEN 650 MG RE SUPP: 325.0000 mg | Freq: Four times a day (QID) | RECTAL | Status: DC | PRN

## 2017-12-24 MED ORDER — IPRATROPIUM-ALBUTEROL 20-100 MCG/ACT IN AERS: 1.0000 | INHALATION_SPRAY | Freq: Four times a day (QID) | RESPIRATORY_TRACT | Status: DC

## 2017-12-24 MED ORDER — SODIUM CHLORIDE 0.9 % IV SOLN
INTRAVENOUS | Status: DC
  Administered 2017-12-24: 15:00:00 75 mL/h via INTRAVENOUS
  Administered 2017-12-25: 03:00:00 via INTRAVENOUS

## 2017-12-24 MED ORDER — FUROSEMIDE 40 MG PO TABS
40.0000 mg | ORAL_TABLET | Freq: Every day | ORAL | Status: DC
  Administered 2017-12-24 – 2017-12-27 (×4): 40 mg via ORAL
  Filled 2017-12-24 (×4): qty 1

## 2017-12-24 MED ORDER — POLYVINYL ALCOHOL 1.4 % OP SOLN: 1.0000 [drp] | Freq: Four times a day (QID) | OPHTHALMIC | Status: DC | PRN

## 2017-12-24 MED ORDER — METOPROLOL TARTRATE 5 MG/5ML IV SOLN: 5.0000 mg | INTRAVENOUS | Status: DC | PRN

## 2017-12-24 MED ORDER — ONDANSETRON HCL 4 MG/2ML IJ SOLN: 4.0000 mg | Freq: Four times a day (QID) | INTRAMUSCULAR | Status: DC | PRN

## 2017-12-24 MED ORDER — VITAMIN D 1000 UNITS PO TABS
1000.0000 [IU] | ORAL_TABLET | Freq: Every day | ORAL | Status: DC
  Administered 2017-12-25 – 2017-12-27 (×3): 1000 [IU] via ORAL
  Filled 2017-12-24 (×3): qty 1

## 2017-12-24 MED ORDER — PANTOPRAZOLE SODIUM 40 MG PO TBEC
40.0000 mg | DELAYED_RELEASE_TABLET | Freq: Two times a day (BID) | ORAL | Status: DC
  Administered 2017-12-24 – 2017-12-27 (×6): 40 mg via ORAL
  Filled 2017-12-24 (×6): qty 1

## 2017-12-24 MED ORDER — FAMOTIDINE 20 MG PO TABS
20.0000 mg | ORAL_TABLET | Freq: Every day | ORAL | Status: DC
  Administered 2017-12-24 – 2017-12-27 (×4): 20 mg via ORAL
  Filled 2017-12-24 (×4): qty 1

## 2017-12-24 MED ORDER — LATANOPROST 0.005 % OP SOLN
1.0000 [drp] | Freq: Every day | OPHTHALMIC | Status: DC
  Administered 2017-12-24 – 2017-12-26 (×3): 1 [drp] via OPHTHALMIC
  Filled 2017-12-24: qty 2.5

## 2017-12-24 MED ORDER — METOPROLOL TARTRATE 25 MG PO TABS
12.5000 mg | ORAL_TABLET | Freq: Two times a day (BID) | ORAL | Status: DC
  Administered 2017-12-24 – 2017-12-27 (×7): 12.5 mg via ORAL
  Filled 2017-12-24 (×7): qty 1

## 2017-12-24 MED ORDER — SODIUM CHLORIDE 0.9 % IV SOLN
1.0000 g | Freq: Every day | INTRAVENOUS | Status: DC
  Administered 2017-12-25 – 2017-12-26 (×2): 1 g via INTRAVENOUS
  Filled 2017-12-24 (×3): qty 10

## 2017-12-24 MED ORDER — BISACODYL 5 MG PO TBEC
5.0000 mg | DELAYED_RELEASE_TABLET | Freq: Two times a day (BID) | ORAL | Status: DC
  Administered 2017-12-24 – 2017-12-26 (×6): 5 mg via ORAL
  Filled 2017-12-24 (×6): qty 1

## 2017-12-24 MED ORDER — SODIUM CHLORIDE 0.9 % IV SOLN
2.0000 g | Freq: Once | INTRAVENOUS | Status: AC
  Administered 2017-12-24: 2 g via INTRAVENOUS
  Filled 2017-12-24: qty 2

## 2017-12-24 MED ORDER — ACETAMINOPHEN 325 MG PO TABS: 325.0000 mg | ORAL_TABLET | Freq: Four times a day (QID) | ORAL | Status: DC | PRN

## 2017-12-24 MED ORDER — ENOXAPARIN SODIUM 30 MG/0.3ML ~~LOC~~ SOLN
30.0000 mg | SUBCUTANEOUS | Status: DC
  Administered 2017-12-24 – 2017-12-26 (×3): 30 mg via SUBCUTANEOUS
  Filled 2017-12-24 (×3): qty 0.3

## 2017-12-24 MED ORDER — ONDANSETRON HCL 4 MG PO TABS: 4.0000 mg | ORAL_TABLET | Freq: Four times a day (QID) | ORAL | Status: DC | PRN

## 2017-12-24 MED ORDER — ALBUTEROL SULFATE (2.5 MG/3ML) 0.083% IN NEBU
2.5000 mg | INHALATION_SOLUTION | Freq: Four times a day (QID) | RESPIRATORY_TRACT | Status: DC | PRN
Start: 2017-12-24 — End: 2017-12-27

## 2017-12-24 MED ORDER — IPRATROPIUM-ALBUTEROL 0.5-2.5 (3) MG/3ML IN SOLN
3.0000 mL | Freq: Four times a day (QID) | RESPIRATORY_TRACT | Status: DC
  Administered 2017-12-24 – 2017-12-25 (×4): 3 mL via RESPIRATORY_TRACT
  Filled 2017-12-24 (×4): qty 3

## 2017-12-24 NOTE — [place] FL2 (Signed)
[place] MEDICAID FL2 LEVEL OF CARE SCREENING TOOL     IDENTIFICATION  Patient Name: Kelly Booth Birthdate: 1920/01/11 Sex: female Admission Date (Current Location): 12/24/2017  [place] and [place] Number:  Chiropodist and Address:  [facility], [address] [place], [place] [address]      Provider Number: [id number]  Attending Physician Name and Address:  Ramonita Lab, MD  Relative Name and Phone Number:  Desirae, Mancusi [phone]  [phone] or Nikeshia, Keetch [phone]     Current Level of Care: Hospital Recommended Level of Care: Skilled Nursing Facility Prior Approval Number:    Date Approved/Denied:   PASRR Number: [id number]  Discharge Plan: SNF    Current Diagnoses: Patient Active Problem List   Diagnosis Date Noted  . Sepsis (HCC) 12/24/2017  . Acute respiratory failure with hypoxia (HCC) 10/13/2017  . Acute low back pain 10/12/2017  . Compression fracture of body of thoracic vertebra (HCC) 10/12/2017  . Acute respiratory failure with hypoxia and hypercapnia (HCC) 06/11/2016  . Acute diastolic CHF (congestive heart failure) (HCC) 06/11/2016  . Bilateral pleural effusion 06/11/2016  . Severe aortic stenosis 06/11/2016  . Acute posthemorrhagic anemia 06/11/2016  . Acute renal insufficiency 06/11/2016  . A. fib, RVR 06/11/2016  . Atrial fibrillation with RVR (HCC) 06/11/2016  . Elevated troponin 06/11/2016  . UTI (urinary tract infection) 06/11/2016  . DNR (do not resuscitate) 06/09/2016  . Palliative care by specialist 06/09/2016  . Weakness generalized 06/09/2016  . Dyspnea   . Bleeding gastrointestinal 06/05/2016  . Malnutrition of moderate degree 12/06/2015  . Bradycardia 12/05/2015    Orientation RESPIRATION BLADDER Height & Weight     Self, Situation, Place  O2(1L) Continent Weight: 91 lb 12.8 oz (41.6 kg) Height:  5' (152.4 cm)  BEHAVIORAL SYMPTOMS/MOOD NEUROLOGICAL BOWEL NUTRITION  STATUS      Continent Diet(Regular diet)  AMBULATORY STATUS COMMUNICATION OF NEEDS Skin   Limited Assist Verbally Normal                       Personal Care Assistance Level of Assistance  Bathing, Feeding, Dressing Bathing Assistance: Limited assistance Feeding assistance: Independent Dressing Assistance: Limited assistance     Functional Limitations Info  Sight, Speech, Hearing Sight Info: Adequate Hearing Info: Impaired Speech Info: Adequate    SPECIAL CARE FACTORS FREQUENCY                       Contractures Contractures Info: Not present    Additional Factors Info  Code Status, Allergies Code Status Info: DNR Allergies Info: CECLOR CEFACLOR            Current Medications (12/24/2017):  This is the current hospital active medication list Current Facility-Administered Medications  Medication Dose Route Frequency Provider Last Rate Last Dose  . 0.9 %  sodium chloride infusion   Intravenous Continuous Gouru, Aruna, MD 75 mL/hr at 12/24/17 1436 75 mL/hr at 12/24/17 1436  . acetaminophen (TYLENOL) tablet 325 mg  325 mg Oral Q6H PRN Gouru, Aruna, MD       Or  . acetaminophen (TYLENOL) suppository 325 mg  325 mg Rectal Q6H PRN Gouru, Aruna, MD      . albuterol (PROVENTIL) (2.5 MG/3ML) 0.083% nebulizer solution 2.5 mg  2.5 mg Nebulization Q6H PRN Gouru, Aruna, MD      . bisacodyl (DULCOLAX) EC tablet 5 mg  5 mg Oral BID Gouru, Aruna, MD   5 mg  at 12/24/17 1509  . cholecalciferol (VITAMIN D) tablet 1,000 Units  1,000 Units Oral Daily Gouru, Aruna, MD      . enoxaparin (LOVENOX) injection 30 mg  30 mg Subcutaneous Q24H Gouru, Aruna, MD      . famotidine (PEPCID) tablet 20 mg  20 mg Oral Daily Gouru, Aruna, MD   20 mg at 12/24/17 1509  . furosemide (LASIX) tablet 40 mg  40 mg Oral Daily Gouru, Aruna, MD   40 mg at 12/24/17 1508  . ipratropium-albuterol (DUONEB) 0.5-2.5 (3) MG/3ML nebulizer solution 3 mL  3 mL Nebulization QID Gouru, Aruna, MD   3 mL at 12/24/17 1540   . latanoprost (XALATAN) 0.005 % ophthalmic solution 1 drop  1 drop Both Eyes QHS Gouru, Aruna, MD      . metoprolol tartrate (LOPRESSOR) injection 5 mg  5 mg Intravenous Q4H PRN Gouru, Aruna, MD      . metoprolol tartrate (LOPRESSOR) tablet 12.5 mg  12.5 mg Oral BID Gouru, Aruna, MD   12.5 mg at 12/24/17 1509  . ondansetron (ZOFRAN) tablet 4 mg  4 mg Oral Q6H PRN Gouru, Aruna, MD       Or  . ondansetron (ZOFRAN) injection 4 mg  4 mg Intravenous Q6H PRN Gouru, Aruna, MD      . pantoprazole (PROTONIX) EC tablet 40 mg  40 mg Oral BID AC Gouru, Aruna, MD      . polyvinyl alcohol (LIQUIFILM TEARS) 1.4 % ophthalmic solution 1 drop  1 drop Both Eyes QID PRN Gouru, Aruna, MD         Discharge Medications: Please see discharge summary for a list of discharge medications.  Relevant Imaging Results:  Relevant Lab Results:   Additional Information [id number]  Darleene Cleavernterhaus, Risha Barretta R, [place]

## 2017-12-24 NOTE — Plan of Care (Signed)
Pt admitted today from the ED. VSS. No signs of pain. Alert to self. Lethargic.

## 2017-12-24 NOTE — ED Provider Notes (Signed)
[facility] Emergency Department Provider Note    None    (approximate)  I have reviewed the triage vital signs and the nursing notes.   HISTORY  Chief Complaint Code Sepsis  Level v Caveat:  AMS- sepsis  HPI Kelly Booth is a 84 y.o. female presents from [place] home for altered mental status and lethargy.  Patient found to be febrile to 100.7.  Does have a history of recent UTI.  No nausea or vomiting.  Also noted to be hypoxic to 87% on room air and was placed on 2 L nasal cannula.  Patient unable to provide any additional history.  She does arrive with a signed DNR.  Past Medical History:  Diagnosis Date  . A-fib (HCC)   . Allergic rhinitis   . Constipation   . Dyspepsia   . Esophageal reflux   . Generalized weakness   . Glaucoma   . Hypertension   . Osteoporosis   . Pulmonary fibrosis (HCC)    There is no evidence of pulmonary fibrosis on CT chest   Family History  Problem Relation Age of Onset  . Hypertension Other    Past Surgical History:  Procedure Laterality Date  . ABDOMINAL HYSTERECTOMY    . TOTAL HIP ARTHROPLASTY     Patient Active Problem List   Diagnosis Date Noted  . Acute respiratory failure with hypoxia (HCC) 10/13/2017  . Acute low back pain 10/12/2017  . Compression fracture of body of thoracic vertebra (HCC) 10/12/2017  . Acute respiratory failure with hypoxia and hypercapnia (HCC) 06/11/2016  . Acute diastolic CHF (congestive heart failure) (HCC) 06/11/2016  . Bilateral pleural effusion 06/11/2016  . Severe aortic stenosis 06/11/2016  . Acute posthemorrhagic anemia 06/11/2016  . Acute renal insufficiency 06/11/2016  . A. fib, RVR 06/11/2016  . Atrial fibrillation with RVR (HCC) 06/11/2016  . Elevated troponin 06/11/2016  . UTI (urinary tract infection) 06/11/2016  . DNR (do not resuscitate) 06/09/2016  . Palliative care by specialist 06/09/2016  . Weakness generalized 06/09/2016  . Dyspnea   . Bleeding  gastrointestinal 06/05/2016  . Malnutrition of moderate degree 12/06/2015  . Bradycardia 12/05/2015      Prior to Admission medications   Medication Sig Start Date End Date Taking? Authorizing Provider  bisacodyl (STIMULANT LAXATIVE) 5 MG EC tablet Take 5 mg by mouth 2 (two) times daily.  09/18/15  Yes [provider]  Calcium Carbonate-Vitamin D (CALCIUM-VITAMIN D) 500-200 MG-UNIT tablet Take 1 tablet by mouth 2 (two) times daily. 09/18/15  Yes [provider]  Cholecalciferol (VITAMIN D3) 1000 units CAPS Take 1 capsule by mouth daily. 09/18/15  Yes [provider]  ferrous sulfate 325 (65 FE) MG tablet Take 1 tablet by mouth daily. 09/18/15  Yes [provider]  furosemide (LASIX) 40 MG tablet Take 40 mg by mouth daily.  09/18/15  Yes [provider]  Ipratropium-Albuterol (COMBIVENT) 20-100 MCG/ACT AERS respimat Inhale 1 puff into the lungs 4 (four) times daily. 09/18/15  Yes [provider]  latanoprost (XALATAN) 0.005 % ophthalmic solution Apply 1 drop to eye at bedtime. 09/18/15  Yes [provider]  metoprolol tartrate (LOPRESSOR) 25 MG tablet Take 0.5 tablets (12.5 mg total) by mouth 2 (two) times daily. 06/11/16  Yes Katharina Caper, MD  ranitidine (ZANTAC) 150 MG tablet Take 1 tablet by mouth at bedtime.  09/18/15  Yes [provider]  albuterol (PROVENTIL) (2.5 MG/3ML) 0.083% nebulizer solution Take 3 mLs (2.5 mg total) by  nebulization every 6 (six) hours as needed for wheezing or shortness of breath. 10/15/17   Enedina Finner, MD  pantoprazole (PROTONIX) 40 MG tablet Take 1 tablet (40 mg total) by mouth 2 (two) times daily before a meal. Patient not taking: Reported on 12/24/2017 06/11/16   Katharina Caper, MD  polyvinyl alcohol (ARTIFICIAL TEARS) 1.4 % ophthalmic solution Apply 1 drop to eye 4 (four) times daily as needed. 09/18/15   [provider]    Allergies Ceclor [cefaclor]    Social History Social  History   Tobacco Use  . Smoking status: Never Smoker  . Smokeless tobacco: Never Used  Substance Use Topics  . Alcohol use: No    Alcohol/week: 0.0 oz  . Drug use: No    Review of Systems Patient denies headaches, rhinorrhea, blurry vision, numbness, shortness of breath, chest pain, edema, cough, abdominal pain, nausea, vomiting, diarrhea, dysuria, fevers, rashes or hallucinations unless otherwise stated above in HPI. ____________________________________________   PHYSICAL EXAM:  VITAL SIGNS: Vitals:   12/24/17 1012  BP: (!) 178/71  Pulse: (!) 103  Temp: 100 F (37.8 C)  SpO2: 100%    Constitutional: acutely ill and frail appearing Eyes: Conjunctivae are normal.  Head: Atraumatic. Nose: No congestion/rhinnorhea. Mouth/Throat: Mucous membranes are moist.   Neck: No stridor. Painless ROM.  Cardiovascular: Normal rate, regular rhythm. Grossly normal heart sounds.  Good peripheral circulation. Respiratory: Normal respiratory effort.  No retractions. Lungs with coarse bibasilar BS Gastrointestinal: Soft and nontender. No distention. No abdominal bruits. No CVA tenderness. Genitourinary: deferred Musculoskeletal: No lower extremity tenderness nor edema.  No joint effusions. Neurologic:   No gross focal neurologic deficits are appreciated. No facial droop Skin:  Skin is warm, dry and intact. No rash noted. Psychiatric: unable to assess ____________________________________________   LABS (all labs ordered are listed, but only abnormal results are displayed)  Results for orders placed or performed during the hospital encounter of 12/24/17 (from the past 24 hour(s))  Lactic acid, plasma     Status: None   Collection Time: 12/24/17 10:15 AM  Result Value Ref Range   Lactic Acid, Venous 1.5 0.5 - 1.9 mmol/L  Comprehensive metabolic panel     Status: Abnormal   Collection Time: 12/24/17 10:15 AM  Result Value Ref Range   Sodium 140 135 - 145 mmol/L   Potassium 3.8 3.5 - 5.1  mmol/L   Chloride 94 (L) 101 - 111 mmol/L   CO2 34 (H) 22 - 32 mmol/L   Glucose, Bld 129 (H) 65 - 99 mg/dL   BUN 23 (H) 6 - 20 mg/dL   Creatinine, Ser 1.61 0.44 - 1.00 mg/dL   Calcium 8.8 (L) 8.9 - 10.3 mg/dL   Total Protein 7.9 6.5 - 8.1 g/dL   Albumin 3.0 (L) 3.5 - 5.0 g/dL   AST 22 15 - 41 U/L   ALT 10 (L) 14 - 54 U/L   Alkaline Phosphatase 82 38 - 126 U/L   Total Bilirubin 0.8 0.3 - 1.2 mg/dL   GFR calc non Af Amer 53 (L) >60 mL/min   GFR calc Af Amer >60 >60 mL/min   Anion gap 12 5 - 15  Troponin I     Status: None   Collection Time: 12/24/17 10:15 AM  Result Value Ref Range   Troponin I <0.03 <0.03 ng/mL  CBC WITH DIFFERENTIAL     Status: Abnormal   Collection Time: 12/24/17 10:15 AM  Result Value Ref Range   WBC 14.3 (H) 3.6 -  11.0 K/uL   RBC 3.74 (L) 3.80 - 5.20 MIL/uL   Hemoglobin 12.3 12.0 - 16.0 g/dL   HCT 03.4 74.2 - 59.5 %   MCV 98.5 80.0 - 100.0 fL   MCH 33.0 26.0 - 34.0 pg   MCHC 33.5 32.0 - 36.0 g/dL   RDW 63.8 75.6 - 43.3 %   Platelets 283 150 - 440 K/uL   Neutrophils Relative % 94 %   Neutro Abs 13.3 (H) 1.4 - 6.5 K/uL   Lymphocytes Relative 4 %   Lymphs Abs 0.6 (L) 1.0 - 3.6 K/uL   Monocytes Relative 2 %   Monocytes Absolute 0.3 0.2 - 0.9 K/uL   Eosinophils Relative 0 %   Eosinophils Absolute 0.0 0 - 0.7 K/uL   Basophils Relative 0 %   Basophils Absolute 0.0 0 - 0.1 K/uL  Urinalysis, Complete w Microscopic     Status: Abnormal   Collection Time: 12/24/17 10:15 AM  Result Value Ref Range   Color, Urine YELLOW (A) YELLOW   APPearance CLOUDY (A) CLEAR   Specific Gravity, Urine 1.013 1.005 - 1.030   pH 7.0 5.0 - 8.0   Glucose, UA NEGATIVE NEGATIVE mg/dL   Hgb urine dipstick SMALL (A) NEGATIVE   Bilirubin Urine NEGATIVE NEGATIVE   Ketones, ur NEGATIVE NEGATIVE mg/dL   Protein, ur 30 (A) NEGATIVE mg/dL   Nitrite POSITIVE (A) NEGATIVE   Leukocytes, UA SMALL (A) NEGATIVE   RBC / HPF 0-5 0 - 5 RBC/hpf   WBC, UA TOO NUMEROUS TO COUNT 0 - 5 WBC/hpf    Bacteria, UA MANY (A) NONE SEEN   Squamous Epithelial / LPF 0-5 (A) NONE SEEN   WBC Clumps PRESENT    Mucus PRESENT    ____________________________________________ _______________________________  RADIOLOGY  I personally reviewed all radiographic images ordered to evaluate for the above acute complaints and reviewed radiology reports and findings.  These findings were personally discussed with the patient.  Please see medical record for radiology report.  ____________________________________________   PROCEDURES  Procedure(s) performed:  .Critical Care Performed by: Willy Eddy, MD Authorized by: Willy Eddy, MD   Critical care provider statement:    Critical care time (minutes):  35   Critical care time was exclusive of:  Separately billable procedures and treating other patients   Critical care was necessary to treat or prevent imminent or life-threatening deterioration of the following conditions:  Sepsis   Critical care was time spent personally by me on the following activities:  Development of treatment plan with patient or surrogate, discussions with consultants, evaluation of patient's response to treatment, examination of patient, obtaining history from patient or surrogate, ordering and performing treatments and interventions, ordering and review of laboratory studies, ordering and review of radiographic studies, pulse oximetry, re-evaluation of patient's condition and review of old charts      Critical Care performed: yes ____________________________________________   INITIAL IMPRESSION / ASSESSMENT AND PLAN / ED COURSE  Pertinent labs & imaging results that were available during my care of the patient were reviewed by me and considered in my medical decision making (see chart for details).  DDX: Dehydration, sepsis, pna, uti, hypoglycemia, cva, drug effect, withdrawal, encephalitis   Kelly Booth is a 84 y.o. who presents to the ED with symptoms  as described above.  Patient is critically ill-appearing with multiple comorbidities and frailty factors, getting her presentation.  Does have fever given her acute encephalopathy I am concerned for sepsis.  Will proceed with septic workup.  Blood work will be drawn.  Will provide gentle IV hydration.  Order chest x-ray.  The patient will be placed on continuous pulse oximetry and telemetry for monitoring.  Laboratory evaluation will be sent to evaluate for the above complaints.     Clinical Course as of Dec 24 1153  Fri Dec 24, 2017  1120 Pt with evidence of nitrite positive UTI.  Does have a history of E. coli UTI.  Given her sepsis will continue with IV fluids as well as start IV antibiotics for healthcare associated UTI.   [PR]  1139 Provide slow IV hydration as she is not hypotensive with negative lactate.  She can receive her resuscitation slowly over the course of her hospital stay   [PR]    Clinical Course User Index [PR] Willy Eddyobinson, Sheilah Rayos, MD     As part of my medical decision making, I reviewed the following data within the electronic MEDICAL RECORD NUMBER Nursing notes reviewed and incorporated, Labs reviewed, notes from prior ED visits and [place] Controlled Substance Database   ____________________________________________   FINAL CLINICAL IMPRESSION(S) / ED DIAGNOSES  Final diagnoses:  Sepsis, due to unspecified organism [facility](HCC)  Acute cystitis with hematuria      NEW MEDICATIONS STARTED DURING THIS VISIT:  New Prescriptions   No medications on file     Note:  This document was prepared using Dragon voice recognition software and may include unintentional dictation errors.    Willy Eddyobinson, Jonta Gastineau, MD 12/24/17 [phone]

## 2017-12-24 NOTE — ED Triage Notes (Signed)
Pt presents today from [facility] via [facility]. EMS reports pt was picked for unresponsive. EMS states pt was lethargic on arrival. Pt is alert but not oriented.

## 2017-12-24 NOTE — Progress Notes (Signed)
Family Meeting Note  Advance Directive:yes  Today a meeting took place with the Patient.     The following clinical team members were present during this meeting:MD  The following were discussed:Patient's diagnosis: Urosepsis, GERD, hypertension, vitamin D deficiency, treatment plan of care was discussed in detail  patient's progosis: Unable to determine and Goals for treatment: DNR, son Jodi GeraldsRonald Mclelland is a healthcare POA  Additional follow-up to be provided: hospitalist  Time spent during discussion:17 min  Ramonita LabAruna Brenlee Koskela, MD

## 2017-12-24 NOTE — ED Notes (Signed)
Code sepsis called to [facility], doug  1028

## 2017-12-24 NOTE — H&P (Signed)
Kelly Booth at [facility] NAME: Kelly Booth    MR#:  [id number]  DATE OF BIRTH:  28-Jun-1920  DATE OF ADMISSION:  12/24/2017  PRIMARY CARE PHYSICIAN: [facility], Jef[facility] Bryant   REQUESTING/REFERRING PHYSICIAN: Quentin Cornwall  CHIEF COMPLAINT:  Fever  HISTORY OF PRESENT ILLNESS:  Kelly Booth  is a 84 y.o. female with a known history of allergic rhinitis, GERD, hypertension and  other medical problems is sent over from [facility] [place] home for altered mental status and lethargy.  Patient was found to be febrile with 100.7 fever.  UA is abnormal.  PAST MEDICAL HISTORY:   Past Medical History:  Diagnosis Date  . A-fib ([place])   . Allergic rhinitis   . Constipation   . Dyspepsia   . Esophageal reflux   . Generalized weakness   . Glaucoma   . Hypertension   . Osteoporosis   . Pulmonary fibrosis (HCC)    There is no evidence of pulmonary fibrosis on CT chest    PAST SURGICAL HISTOIRY:   Past Surgical History:  Procedure Laterality Date  . ABDOMINAL HYSTERECTOMY    . TOTAL HIP ARTHROPLASTY      SOCIAL HISTORY:   Social History   Tobacco Use  . Smoking status: Never Smoker  . Smokeless tobacco: Never Used  Substance Use Topics  . Alcohol use: No    Alcohol/week: 0.0 oz    FAMILY HISTORY:   Family History  Problem Relation Age of Onset  . Hypertension Other     DRUG ALLERGIES:   Allergies  Allergen Reactions  . Ceclor [Cefaclor]     Passed out     REVIEW OF SYSTEMS:  Patient is lethargic review of system unobtainable  MEDICATIONS AT HOME:   Prior to Admission medications   Medication Sig Start Date End Date Taking? Authorizing Provider  bisacodyl (STIMULANT LAXATIVE) 5 MG EC tablet Take 5 mg by mouth 2 (two) times daily.  09/18/15  Yes [provider]  Calcium Carbonate-Vitamin D (CALCIUM-VITAMIN D) 500-200 MG-UNIT tablet Take 1 tablet by mouth 2 (two) times daily. 09/18/15  Yes [provider]   Cholecalciferol (VITAMIN D3) 1000 units CAPS Take 1 capsule by mouth daily. 09/18/15  Yes [provider]  ferrous sulfate 325 (65 FE) MG tablet Take 1 tablet by mouth daily. 09/18/15  Yes [provider]  furosemide (LASIX) 40 MG tablet Take 40 mg by mouth daily.  09/18/15  Yes [provider]  Ipratropium-Albuterol (COMBIVENT) 20-100 MCG/ACT AERS respimat Inhale 1 puff into the lungs 4 (four) times daily. 09/18/15  Yes [provider]  latanoprost (XALATAN) 0.005 % ophthalmic solution Apply 1 drop to eye at bedtime. 09/18/15  Yes [provider]  metoprolol tartrate (LOPRESSOR) 25 MG tablet Take 0.5 tablets (12.5 mg total) by mouth 2 (two) times daily. 06/11/16  Yes Theodoro Grist, MD  ranitidine (ZANTAC) 150 MG tablet Take 1 tablet by mouth at bedtime.  09/18/15  Yes [provider]  albuterol (PROVENTIL) (2.5 MG/3ML) 0.083% nebulizer solution Take 3 mLs (2.5 mg total) by nebulization every 6 (six) hours as needed for wheezing or shortness of breath. 10/15/17   Fritzi Mandes, MD  pantoprazole (PROTONIX) 40 MG tablet Take 1 tablet (40 mg total) by mouth 2 (two) times daily before a meal. Patient not taking: Reported on 12/24/2017 06/11/16   Theodoro Grist, MD  polyvinyl alcohol (ARTIFICIAL TEARS) 1.4 % ophthalmic solution Apply 1 drop to eye 4 (four) times daily as needed.  09/18/15   [provider]      VITAL SIGNS:  Blood pressure (!) 143/54, pulse 98, temperature 99 F (37.2 C), temperature source Oral, resp. rate (!) 32, height 5' (1.524 m), weight 41.6 kg (91 lb 12.8 oz), SpO2 98 %.  PHYSICAL EXAMINATION:  GENERAL:  84 y.o.-year-old patient lying in the bed with no acute distress.  EYES: Pupils equal, round, reactive to light and accommodation. No scleral icterus. Extraocular muscles intact.  HEENT: Head atraumatic, normocephalic. Oropharynx and nasopharynx clear.  NECK:  Supple, no jugular venous distention. No thyroid  enlargement, no tenderness.  LUNGS: Normal breath sounds bilaterally, no wheezing, rales,rhonchi or crepitation. No use of accessory muscles of respiration.  CARDIOVASCULAR: S1, S2 normal. No murmurs, rubs, or gallops.  ABDOMEN: Soft, nontender, nondistended. Bowel sounds present.  EXTREMITIES: No pedal edema, cyanosis, or clubbing.  NEUROLOGIC: Patient is delirious  pSYCHIATRIC: The patient is alert and disoriented SKIN: No obvious rash, lesion, or ulcer.   LABORATORY PANEL:   CBC Recent Labs  Lab 12/24/17 1328  WBC 13.5*  HGB 10.9*  HCT 30.9*  PLT 285   ------------------------------------------------------------------------------------------------------------------  Chemistries  Recent Labs  Lab 12/24/17 1015 12/24/17 1328  NA 140  --   K 3.8  --   CL 94*  --   CO2 34*  --   GLUCOSE 129*  --   BUN 23*  --   CREATININE 0.89 0.85  CALCIUM 8.8*  --   AST 22  --   ALT 10*  --   ALKPHOS 82  --   BILITOT 0.8  --    ------------------------------------------------------------------------------------------------------------------  Cardiac Enzymes Recent Labs  Lab 12/24/17 1015  TROPONINI <0.03   ------------------------------------------------------------------------------------------------------------------  RADIOLOGY:  Dg Chest Port 1 View  Result Date: 12/24/2017 CLINICAL DATA:  Weakness hypoxia unresponsive EXAM: PORTABLE CHEST 1 VIEW COMPARISON:  10/12/2017 FINDINGS: Suboptimal image due to rotation Chronic lung disease with apical scarring bilaterally unchanged. Bibasilar airspace disease and small pleural effusions. Possible atelectasis or pneumonia. Overall there has been some improvement in aeration in the lung bases since the prior study. Negative for heart failure Chronic bilateral rib fractures IMPRESSION: Chronic lung disease. Bibasilar airspace disease with interval improvement. Small bilateral effusions. Negative for edema. Electronically Signed   By:  Franchot Gallo M.D.   On: 12/24/2017 10:48    EKG:   Orders placed or performed during the hospital encounter of 08/16/16  . EKG 12-Lead  . EKG 12-Lead  . ED EKG  . ED EKG  . EKG 12-Lead  . EKG 12-Lead    IMPRESSION AND PLAN:      # sepsis 2/2 UTI Admit to [place] unit Patient met septic criteria with leukocytosis and tachycardia Blood cultures and urine cultures were done IV antibiotics and IV fluids for supportive treatment  #Essential hypertension Continue home medication metoprolol   #GERD Pepcid  #Vitamin D deficiency Vitamin D supplements     All the records are reviewed and case discussed with ED provider. Management plans discussed with the patient, family and they are in agreement.  CODE STATUS: dnr  TOTAL TIME TAKING CARE OF THIS PATIENT: 45 minutes.   Note: This dictation was prepared with Dragon dictation along with smaller phrase technology. Any transcriptional errors that result from this process are unintentional.  Nicholes Mango M.D on 12/24/2017 at 5:30 PM  Between 7am to 6pm - Pager - [phone]  After 6pm go to [email or web address] - password Child psychotherapist Hospitalists  Office  [phone]  CC: Primary care physician; Katheren Shams

## 2017-12-24 NOTE — Clinical Social Work Note (Signed)
Clinical Social Work Assessment  Patient Details  Name: Kelly ShaggyHilda C Sorbello MRN: [id number] Date of Birth: 06/22/1920  Date of referral:  12/24/17               Reason for consult:  Facility Placement                Permission sought to share information with:  Facility Medical sales representativeContact Representative, Family Supports Permission granted to share information::  Yes, Verbal Permission Granted  Name::     Rozann LeschesMauney,Ronald C Son [phone]  [phone] or Frederica KusterMauney,William Grandson [phone]   Agency::  SNF admissions  Relationship::     Contact Information:     Housing/Transportation Living arrangements for the past 2 months:  Skilled Nursing Facility Source of Information:  Adult Children, Medical Team Patient Interpreter Needed:  None Criminal Activity/Legal Involvement Pertinent to Current Situation/Hospitalization:  No - Comment as needed Significant Relationships:  Adult Children Lives with:  Facility Resident Do you feel safe going back to the place where you live?  Yes Need for family participation in patient care:  Yes (Comment)  Care giving concerns: Patient's family would like to have patient go to a different SNF in the future, but they are okay with patient returning [facility].   Social Worker assessment / plan:  Patient is a 84 year old female who is alert and oriented x4, but hard of hearing.  Patient's family was at bedside, patient has been [facility], for a few weeks, receiving rehab, but now patient is not making any more progress, and the family is in the process of transitioning patient [facility].  Patient's son expressed that [facility] has already helped start the process.  CSW informed the family and patient about the process of discharging from hospital and role of CSW.  Patient's family did not have any other questions.  Employment status:  Retired Health and safety inspectornsurance information:  Medicare PT Recommendations:  Not assessed at this  time Information / Referral to community resources:  Skilled Nursing Facility  Patient/Family's Response to care:  Patient and family are in agreement to having patient return to [facility].  Patient/Family's Understanding of and Emotional Response to Diagnosis, Current Treatment, and Prognosis:  Patient's family are aware of current treatment plan and prognosis.  Emotional Assessment Appearance:  Appears stated age Attitude/Demeanor/Rapport:    Affect (typically observed):  Appropriate, Calm, Stable Orientation:  Oriented to Self, Oriented to Place, Oriented to Situation Alcohol / Substance use:  Not Applicable Psych involvement (Current and /or in the community):  No (Comment)  Discharge Needs  Concerns to be addressed:  Care Coordination Readmission within the last 30 days:  No Current discharge risk:  Chronically ill Barriers to Discharge:  Continued Medical Work up   [place] [employer], Sheriff Rodenberg R, LCSWA 12/24/2017, 6:12 PM

## 2017-12-24 NOTE — Progress Notes (Signed)
CODE SEPSIS - PHARMACY COMMUNICATION  **Broad Spectrum Antibiotics should be administered within 1 hour of Sepsis diagnosis**  Time Code Sepsis Called/Page Received: 1030  Antibiotics Ordered: cefepime  Time of 1st antibiotic administration: 1145  Additional action taken by pharmacy: Called ED @ 1125 and 1136, RN unavailable/line busy.    If necessary, Name of Provider/Nurse Contacted:     Kelly CedarStephanie Jocelin Booth ,PharmD Pharmacy Resident  12/24/2017  11:36 AM

## 2017-12-24 NOTE — Progress Notes (Addendum)
Pharmacy Antibiotic Note  Kelly ShaggyHilda C Booth is a 84 y.o. female admitted on 12/24/2017 with UTI.  Pharmacy has been consulted for cefepime dosing.  Plan: Will start cefepime 1g IV q24h (CrCl 24.358ml/min)   Height: 5' (152.4 cm) Weight: 91 lb 12.8 oz (41.6 kg) IBW/kg (Calculated) : 45.5  Temp (24hrs), Avg:100 F (37.8 C), Min:100 F (37.8 C), Max:100 F (37.8 C)  Recent Labs  Lab 12/24/17 1015  WBC 14.3*  CREATININE 0.89  LATICACIDVEN 1.5    Estimated Creatinine Clearance: 24.8 mL/min (by C-G formula based on SCr of 0.89 mg/dL).    Allergies  Allergen Reactions  . Ceclor [Cefaclor]     Passed out     Antimicrobials this admission: 3/22 Cefepime>>  Dose adjustments this admission:  Microbiology results: 3/22 BCx: sent 3/22 UCx: sent  Thank you for allowing pharmacy to be a part of this patient's care.  Kelly CedarStephanie Antanasia Booth, PharmD Pharmacy Resident  12/24/2017 11:38 AM

## 2017-12-25 LAB — CBC
HCT: 26.6 % — ABNORMAL LOW (ref 35.0–47.0)
HEMOGLOBIN: 9.1 g/dL — AB (ref 12.0–16.0)
MCH: 33.9 pg (ref 26.0–34.0)
MCHC: 34.1 g/dL (ref 32.0–36.0)
MCV: 99.6 fL (ref 80.0–100.0)
PLATELETS: 243 10*3/uL (ref 150–440)
RBC: 2.67 MIL/uL — ABNORMAL LOW (ref 3.80–5.20)
RDW: 14.7 % — AB (ref 11.5–14.5)
WBC: 9.5 10*3/uL (ref 3.6–11.0)

## 2017-12-25 LAB — COMPREHENSIVE METABOLIC PANEL
ALBUMIN: 2.3 g/dL — AB (ref 3.5–5.0)
ALT: 9 U/L — ABNORMAL LOW (ref 14–54)
ANION GAP: 7 (ref 5–15)
AST: 19 U/L (ref 15–41)
Alkaline Phosphatase: 55 U/L (ref 38–126)
BUN: 19 mg/dL (ref 6–20)
CHLORIDE: 100 mmol/L — AB (ref 101–111)
CO2: 32 mmol/L (ref 22–32)
Calcium: 7.6 mg/dL — ABNORMAL LOW (ref 8.9–10.3)
Creatinine, Ser: 0.86 mg/dL (ref 0.44–1.00)
GFR calc Af Amer: >60 mL/min (ref >60)
GFR calc non Af Amer: 56 mL/min — ABNORMAL LOW (ref >60)
GLUCOSE: 94 mg/dL (ref 65–99)
Potassium: 3.1 mmol/L — ABNORMAL LOW (ref 3.5–5.1)
SODIUM: 139 mmol/L (ref 135–145)
Total Bilirubin: 0.9 mg/dL (ref 0.3–1.2)
Total Protein: 5.8 g/dL — ABNORMAL LOW (ref 6.5–8.1)

## 2017-12-25 MED ORDER — IPRATROPIUM-ALBUTEROL 0.5-2.5 (3) MG/3ML IN SOLN
3.0000 mL | Freq: Three times a day (TID) | RESPIRATORY_TRACT | Status: DC
  Administered 2017-12-25 – 2017-12-27 (×5): 3 mL via RESPIRATORY_TRACT
  Filled 2017-12-25 (×5): qty 3

## 2017-12-25 NOTE — Plan of Care (Signed)
  Problem: Clinical Measurements: Goal: Respiratory complications will improve Outcome: Progressing  -able to wean pt to room air this shift with O2 sats > 92% Problem: Clinical Measurements: Goal: Cardiovascular complication will be avoided Outcome: Progressing  -off unit Telemetry monitor discontinued by MD this shift Problem: Nutrition: Goal: Adequate nutrition will be maintained Outcome: Progressing  -pt able to feed self meals after staff set the meal trays up for her Problem: Skin Integrity: Goal: Risk for impaired skin integrity will decrease Outcome: Progressing  -pink foam pads remain in place for protection to bil heels, back and sacrum

## 2017-12-25 NOTE — Progress Notes (Signed)
[facility] - [place] [facility]   PATIENT NAME: Kelly Booth    MR#:  [id number]  DATE OF BIRTH:  06/20/1920  SUBJECTIVE:  CHIEF COMPLAINT:   Chief Complaint  Patient presents with  . Code Sepsis   More awake.  Family at bedside.  Afebrile.  REVIEW OF SYSTEMS:    Review of Systems  Unable to perform ROS: Dementia    DRUG ALLERGIES:   Allergies  Allergen Reactions  . Ceclor [Cefaclor]     Passed out     VITALS:  Blood pressure (!) 145/44, pulse 69, temperature (!) 97.4 F (36.3 C), temperature source Oral, resp. rate 18, height 5' (1.524 m), weight 41.6 kg (91 lb 12.8 oz), SpO2 92 %.  PHYSICAL EXAMINATION:   Physical Exam  GENERAL:  84 y.o.-year-old patient lying in the bed with no acute distress.  EYES: Pupils equal, round, reactive to light and accommodation. No scleral icterus. Extraocular muscles intact.  HEENT: Head atraumatic, normocephalic. Oropharynx and nasopharynx clear.  NECK:  Supple, no jugular venous distention. No thyroid enlargement, no tenderness.  LUNGS: Bibasilar crackles CARDIOVASCULAR: S1, S2 normal. No murmurs, rubs, or gallops.  ABDOMEN: Soft, nontender, nondistended. Bowel sounds present. No organomegaly or mass.  EXTREMITIES: No cyanosis, clubbing or edema b/l.    NEUROLOGIC: Cranial nerves II through XII are intact. No focal Motor or sensory deficits b/l.   PSYCHIATRIC: The patient is pleasantly confused SKIN: No obvious rash, lesion, or ulcer.   LABORATORY PANEL:   CBC Recent Labs  Lab 12/25/17 0554  WBC 9.5  HGB 9.1*  HCT 26.6*  PLT 243   ------------------------------------------------------------------------------------------------------------------ Chemistries  Recent Labs  Lab 12/25/17 0554  NA 139  K 3.1*  CL 100*  CO2 32  GLUCOSE 94  BUN 19  CREATININE 0.86  CALCIUM 7.6*  AST 19  ALT 9*  ALKPHOS 55  BILITOT 0.9    ------------------------------------------------------------------------------------------------------------------  Cardiac Enzymes Recent Labs  Lab 12/24/17 1015  TROPONINI <0.03   ------------------------------------------------------------------------------------------------------------------  RADIOLOGY:  Dg Chest Port 1 View  Result Date: 12/24/2017 CLINICAL DATA:  Weakness hypoxia unresponsive EXAM: PORTABLE CHEST 1 VIEW COMPARISON:  10/12/2017 FINDINGS: Suboptimal image due to rotation Chronic lung disease with apical scarring bilaterally unchanged. Bibasilar airspace disease and small pleural effusions. Possible atelectasis or pneumonia. Overall there has been some improvement in aeration in the lung bases since the prior study. Negative for heart failure Chronic bilateral rib fractures IMPRESSION: Chronic lung disease. Bibasilar airspace disease with interval improvement. Small bilateral effusions. Negative for edema. Electronically Signed   By: Marlan Palauharles  Clark M.D.   On: 12/24/2017 10:48     ASSESSMENT AND PLAN:   # UTI with sepsis Blood cultures and urine cultures are NGTD IV antibiotics  # Acute toxic encephalopathy over baseline dementia Improving  #Essential hypertension Continue metoprolol  #Pulmonary fibrosis.  As needed oxygen.  All the records are reviewed and case discussed with Care Management/Social Worker Management plans discussed with the patient, family and they are in agreement.  CODE STATUS: DNR  DVT Prophylaxis: SCDs  TOTAL TIME TAKING CARE OF THIS PATIENT: 30 minutes.   POSSIBLE D/C IN 1-2 DAYS, DEPENDING ON CLINICAL CONDITION.  Molinda BailiffSrikar R Nikkole Placzek M.D on 12/25/2017 at 1:34 PM  Between 7am to 6pm - Pager - [phone]  After 6pm go to [email or web address] - password [email or web address] [facility]  [facility] [place] [facility]  Office  [phone]  CC: Primary care physician; [facility], [employer]  Note: This dictation was prepared with Dragon dictation along  with  smaller phrase technology. Any transcriptional errors that result from this process are unintentional.

## 2017-12-26 LAB — URINE CULTURE

## 2017-12-26 MED ORDER — CIPROFLOXACIN HCL 250 MG PO TABS: 250.0000 mg | ORAL_TABLET | Freq: Two times a day (BID) | ORAL | 0 refills | Status: AC

## 2017-12-26 NOTE — Discharge Instructions (Signed)
Resume diet and activity as before ° ° °

## 2017-12-26 NOTE — Clinical Social Work Note (Signed)
CSW received update from the attending MD that the family is requesting a change in facility venue. CSW has begun the bed search referral process. CSW will update with bed offers as they become available.  Argentina PonderKaren Martha Mersadez Linden, MSW, Theresia MajorsLCSWA [phone]

## 2017-12-26 NOTE — Progress Notes (Signed)
[facility] - [place] [facility]   PATIENT NAME: Kelly Booth    MR#:  [id number]  DATE OF BIRTH:  03/04/1920  SUBJECTIVE:  CHIEF COMPLAINT:   Chief Complaint  Patient presents with  . Code Sepsis   Awake and alert.   Patient has no concerns  REVIEW OF SYSTEMS:    Review of Systems  Unable to perform ROS: Dementia    DRUG ALLERGIES:   Allergies  Allergen Reactions  . Ceclor [Cefaclor]     Passed out     VITALS:  Blood pressure (!) 141/53, pulse 94, temperature 98.2 F (36.8 C), temperature source Oral, resp. rate 20, height 5' (1.524 m), weight 41.6 kg (91 lb 12.8 oz), SpO2 97 %.  PHYSICAL EXAMINATION:   Physical Exam  GENERAL:  84 y.o.-year-old patient lying in the bed with no acute distress.  EYES: Pupils equal, round, reactive to light and accommodation. No scleral icterus. Extraocular muscles intact.  HEENT: Head atraumatic, normocephalic. Oropharynx and nasopharynx clear.  NECK:  Supple, no jugular venous distention. No thyroid enlargement, no tenderness.  LUNGS: Bibasilar crackles CARDIOVASCULAR: S1, S2 normal. No murmurs, rubs, or gallops.  ABDOMEN: Soft, nontender, nondistended. Bowel sounds present. No organomegaly or mass.  EXTREMITIES: No cyanosis, clubbing or edema b/l.    NEUROLOGIC: Cranial nerves II through XII are intact. No focal Motor or sensory deficits b/l.   PSYCHIATRIC: The patient is pleasantly confused SKIN: No obvious rash, lesion, or ulcer.   LABORATORY PANEL:   CBC Recent Labs  Lab 12/25/17 0554  WBC 9.5  HGB 9.1*  HCT 26.6*  PLT 243   ------------------------------------------------------------------------------------------------------------------ Chemistries  Recent Labs  Lab 12/25/17 0554  NA 139  K 3.1*  CL 100*  CO2 32  GLUCOSE 94  BUN 19  CREATININE 0.86  CALCIUM 7.6*  AST 19  ALT 9*  ALKPHOS 55  BILITOT 0.9    ------------------------------------------------------------------------------------------------------------------  Cardiac Enzymes Recent Labs  Lab 12/24/17 1015  TROPONINI <0.03   ------------------------------------------------------------------------------------------------------------------  RADIOLOGY:  No results found.   ASSESSMENT AND PLAN:   #E. coli UTI with sepsis Blood cultures and urine cultures are NGTD IV antibiotics  # Acute toxic encephalopathy over baseline dementia Improving  #Essential hypertension Continue metoprolol  #Pulmonary fibrosis.  As needed oxygen.  All the records are reviewed and case discussed with Care Management/Social Worker Management plans discussed with the patient, family and they are in agreement.  CODE STATUS: DNR  DVT Prophylaxis: SCDs  TOTAL TIME TAKING CARE OF THIS PATIENT: 30 minutes.   Discharge back to skilled nursing facility tomorrow on oral Keflex.  Family requesting a different skilled nursing facility at discharge.  Molinda BailiffSrikar R Verbon Giangregorio M.D on 12/26/2017 at 11:50 AM  Between 7am to 6pm - Pager - [phone]  After 6pm go to [email or web address] - password [email or web address]  [facility] [place] [facility]  Office  [phone]  CC: Primary care physician; [facility], [employer]  Note: This dictation was prepared with Dragon dictation along with smaller phrase technology. Any transcriptional errors that result from this process are unintentional.

## 2017-12-27 NOTE — Progress Notes (Signed)
Pt for discharge back to [facility]. Alert. No distress. Pt a current resident of [facility].  Report to julia  Regarding status.  slx2 d/cd. Pt in transfer pack.  Ems called and pt waiting for transfer

## 2017-12-27 NOTE — Plan of Care (Signed)
  Problem: Education: Goal: Knowledge of General Education information will improve Outcome: Progressing   Problem: Health Behavior/Discharge Planning: Goal: Ability to manage health-related needs will improve Outcome: Progressing   Problem: Clinical Measurements: Goal: Ability to maintain clinical measurements within normal limits will improve Outcome: Progressing Goal: Will remain free from infection Outcome: Progressing Goal: Diagnostic test results will improve Outcome: Progressing Goal: Respiratory complications will improve Outcome: Progressing Goal: Cardiovascular complication will be avoided Outcome: Progressing   Problem: Activity: Goal: Risk for activity intolerance will decrease Outcome: Progressing   Problem: Nutrition: Goal: Adequate nutrition will be maintained Outcome: Progressing   Problem: Coping: Goal: Level of anxiety will decrease Outcome: Progressing   Problem: Elimination: Goal: Will not experience complications related to bowel motility Outcome: Progressing Goal: Will not experience complications related to urinary retention Outcome: Progressing   Problem: Pain Managment: Goal: General experience of comfort will improve Outcome: Progressing   Problem: Safety: Goal: Ability to remain free from injury will improve Outcome: Progressing   Problem: Skin Integrity: Goal: Risk for impaired skin integrity will decrease Outcome: Progressing   Problem: Urinary Elimination: Goal: Signs and symptoms of infection will decrease Outcome: Progressing   

## 2017-12-27 NOTE — Progress Notes (Signed)
Clinical Social Worker (CSW) contacted patient's son [name] to make him aware that patient is stable for D/C today. Per son patient has been [facility] and [facility] 2019. Per son patient was suppose to move into a long term [facility] on Friday 12/24/17 before [facility]. Son reported that patient has a few months she can pay privately for SNF however she is doing the spend down and will get on medicaid. CSW explained that since patient is under long term care and is in the transition stage other SNF's will likely not accept her and she will have to return to [facility]. Per son he is agreeable for patient to return to [facility] and wants to follow up with [facility] about getting on a waiting list. [name] liaison is aware of above.   Patient is medically stable for D/C to [facility] today. [facility] patient can return today to long term care room C-12. RN will call report and arrange EMS for transport. CSW sent D[facility] orders [facility] via [facility]. Patient's son [name] is aware of above. Please reconsult if future social work needs arise. CSW signing off.   Baker Hughes IncorporatedBailey Maigen Mozingo, LCSW [phone]

## 2017-12-27 NOTE — [place] FL2 (Signed)
[place] MEDICAID FL2 LEVEL OF CARE SCREENING TOOL     IDENTIFICATION  Patient Name: Kelly Booth Birthdate: October 17, 1919 Sex: female Admission Date (Current Location): 12/24/2017  [place] and [place] Number:  Chiropodist and Address:  [facility], [address] [place], [place] [address]      Provider Number: [id number]  Attending Physician Name and Address:  Milagros Loll, MD  Relative Name and Phone Number:  Aritha, Huckeba [phone]  [phone] or Jakiera, Ehler [phone]     Current Level of Care: Hospital Recommended Level of Care: Skilled Nursing Facility Prior Approval Number:    Date Approved/Denied:   PASRR Number: [id number]  Discharge Plan: SNF    Current Diagnoses: Patient Active Problem List   Diagnosis Date Noted  . Sepsis (HCC) 12/24/2017  . Acute respiratory failure with hypoxia (HCC) 10/13/2017  . Acute low back pain 10/12/2017  . Compression fracture of body of thoracic vertebra (HCC) 10/12/2017  . Acute respiratory failure with hypoxia and hypercapnia (HCC) 06/11/2016  . Acute diastolic CHF (congestive heart failure) (HCC) 06/11/2016  . Bilateral pleural effusion 06/11/2016  . Severe aortic stenosis 06/11/2016  . Acute posthemorrhagic anemia 06/11/2016  . Acute renal insufficiency 06/11/2016  . A. fib, RVR 06/11/2016  . Atrial fibrillation with RVR (HCC) 06/11/2016  . Elevated troponin 06/11/2016  . UTI (urinary tract infection) 06/11/2016  . DNR (do not resuscitate) 06/09/2016  . Palliative care by specialist 06/09/2016  . Weakness generalized 06/09/2016  . Dyspnea   . Bleeding gastrointestinal 06/05/2016  . Malnutrition of moderate degree 12/06/2015  . Bradycardia 12/05/2015    Orientation RESPIRATION BLADDER Height & Weight     Self, Situation, Place  O2(1L) Continent Weight: 91 lb 12.8 oz (41.6 kg) Height:  5' (152.4 cm)  BEHAVIORAL SYMPTOMS/MOOD NEUROLOGICAL BOWEL NUTRITION  STATUS      Continent Diet(Regular diet)  AMBULATORY STATUS COMMUNICATION OF NEEDS Skin   Limited Assist Verbally Normal                       Personal Care Assistance Level of Assistance  Bathing, Feeding, Dressing Bathing Assistance: Limited assistance Feeding assistance: Independent Dressing Assistance: Limited assistance     Functional Limitations Info  Sight, Speech, Hearing Sight Info: Adequate Hearing Info: Impaired Speech Info: Adequate    SPECIAL CARE FACTORS FREQUENCY                       Contractures Contractures Info: Not present    Additional Factors Info  Code Status, Allergies Code Status Info: DNR Allergies Info: CECLOR CEFACLOR            Current Medications (12/27/2017):  This is the current hospital active medication list Current Facility-Administered Medications  Medication Dose Route Frequency Provider Last Rate Last Dose  . acetaminophen (TYLENOL) tablet 325 mg  325 mg Oral Q6H PRN Gouru, Aruna, MD       Or  . acetaminophen (TYLENOL) suppository 325 mg  325 mg Rectal Q6H PRN Gouru, Aruna, MD      . albuterol (PROVENTIL) (2.5 MG/3ML) 0.083% nebulizer solution 2.5 mg  2.5 mg Nebulization Q6H PRN Gouru, Aruna, MD      . bisacodyl (DULCOLAX) EC tablet 5 mg  5 mg Oral BID Gouru, Aruna, MD   5 mg at 12/26/17 2040  . cefTRIAXone (ROCEPHIN) 1 g in sodium chloride 0.9 % 100 mL IVPB  1 g Intravenous Daily Ramonita Lab, MD  Stopped at 12/26/17 1123  . cholecalciferol (VITAMIN D) tablet 1,000 Units  1,000 Units Oral Daily Ramonita LabGouru, Aruna, MD   1,000 Units at 12/26/17 0947  . enoxaparin (LOVENOX) injection 30 mg  30 mg Subcutaneous Q24H Gouru, Aruna, MD   30 mg at 12/26/17 2040  . famotidine (PEPCID) tablet 20 mg  20 mg Oral Daily Gouru, Aruna, MD   20 mg at 12/26/17 0947  . furosemide (LASIX) tablet 40 mg  40 mg Oral Daily Gouru, Aruna, MD   40 mg at 12/26/17 0947  . ipratropium-albuterol (DUONEB) 0.5-2.5 (3) MG/3ML nebulizer solution 3 mL  3 mL  Nebulization TID Ramonita LabGouru, Aruna, MD   3 mL at 12/27/17 0735  . latanoprost (XALATAN) 0.005 % ophthalmic solution 1 drop  1 drop Both Eyes QHS Gouru, Aruna, MD   1 drop at 12/26/17 2041  . metoprolol tartrate (LOPRESSOR) injection 5 mg  5 mg Intravenous Q4H PRN Gouru, Aruna, MD      . metoprolol tartrate (LOPRESSOR) tablet 12.5 mg  12.5 mg Oral BID Gouru, Aruna, MD   12.5 mg at 12/26/17 2040  . ondansetron (ZOFRAN) tablet 4 mg  4 mg Oral Q6H PRN Gouru, Aruna, MD       Or  . ondansetron (ZOFRAN) injection 4 mg  4 mg Intravenous Q6H PRN Gouru, Aruna, MD      . pantoprazole (PROTONIX) EC tablet 40 mg  40 mg Oral BID AC Gouru, Aruna, MD   40 mg at 12/26/17 1722  . polyvinyl alcohol (LIQUIFILM TEARS) 1.4 % ophthalmic solution 1 drop  1 drop Both Eyes QID PRN Gouru, Aruna, MD         Discharge Medications: Please see discharge summary for a list of discharge medications.  Relevant Imaging Results:  Relevant Lab Results:   Additional Information [id number]  Whyatt Klinger, Darleen CrockerBailey M, LCSW

## 2017-12-27 NOTE — Progress Notes (Signed)
Ems here to transport pt

## 2017-12-27 NOTE — Discharge Summary (Signed)
[facility] -  at [facility]   PATIENT NAME: Kelly Booth    MR#:  [id number]  DATE OF BIRTH:  March 28, 1920  DATE OF ADMISSION:  12/24/2017 ADMITTING PHYSICIAN: Ramonita Lab, MD  DATE OF DISCHARGE: 12/27/2017  PRIMARY CARE PHYSICIAN: [facility]   ADMISSION DIAGNOSIS:  Acute cystitis with hematuria [N30.01] Sepsis, due to unspecified organism (HCC) [A41.9]  DISCHARGE DIAGNOSIS:  Active Problems:   Sepsis (HCC)   SECONDARY DIAGNOSIS:   Past Medical History:  Diagnosis Date  . A-fib (HCC)   . Allergic rhinitis   . Constipation   . Dyspepsia   . Esophageal reflux   . Generalized weakness   . Glaucoma   . Hypertension   . Osteoporosis   . Pulmonary fibrosis (HCC)    There is no evidence of pulmonary fibrosis on CT chest     ADMITTING HISTORY  HISTORY OF PRESENT ILLNESS:  Kelly Booth  is a 84 y.o. female with a known history of allergic rhinitis, GERD, hypertension and  other medical problems is sent over from [place] home for altered mental status and lethargy.  Patient was found to be febrile with 100.7 fever.  UA is abnormal.  HOSPITAL COURSE:   #E. coli UTI with sepsis Sepsis resolved IV antibiotics will be changed to PO ciprofloxacin at discharge  # Acute toxic encephalopathy over baseline dementia Resolved  #Essential hypertension Continue metoprolol  #Pulmonary fibrosis.  As needed oxygen.  Stable for discharge to SNF for PT.  Palliative care to follow at SNF.  Transition to hospice if any worsening    CONSULTS OBTAINED:    DRUG ALLERGIES:   Allergies  Allergen Reactions  . Ceclor [Cefaclor]     Passed out     DISCHARGE MEDICATIONS:   Allergies as of 12/27/2017      Reactions   Ceclor [cefaclor]    Passed out       Medication List    TAKE these medications   albuterol (2.5 MG/3ML) 0.083% nebulizer solution Commonly known as:  PROVENTIL Take 3 mLs (2.5 mg total) by nebulization every 6 (six)  hours as needed for wheezing or shortness of breath.   ARTIFICIAL TEARS 1.4 % ophthalmic solution Generic drug:  polyvinyl alcohol Apply 1 drop to eye 4 (four) times daily as needed.   calcium-vitamin D 500-200 MG-UNIT tablet Take 1 tablet by mouth 2 (two) times daily.   ciprofloxacin 250 MG tablet Commonly known as:  CIPRO Take 1 tablet (250 mg total) by mouth 2 (two) times daily for 5 days.   ferrous sulfate 325 (65 FE) MG tablet Take 1 tablet by mouth daily.   furosemide 40 MG tablet Commonly known as:  LASIX Take 40 mg by mouth daily.   Ipratropium-Albuterol 20-100 MCG/ACT Aers respimat Commonly known as:  COMBIVENT Inhale 1 puff into the lungs 4 (four) times daily.   latanoprost 0.005 % ophthalmic solution Commonly known as:  XALATAN Apply 1 drop to eye at bedtime.   metoprolol tartrate 25 MG tablet Commonly known as:  LOPRESSOR Take 0.5 tablets (12.5 mg total) by mouth 2 (two) times daily.   pantoprazole 40 MG tablet Commonly known as:  PROTONIX Take 1 tablet (40 mg total) by mouth 2 (two) times daily before a meal.   ranitidine 150 MG tablet Commonly known as:  ZANTAC Take 1 tablet by mouth at bedtime.   STIMULANT LAXATIVE 5 MG EC tablet Generic drug:  bisacodyl Take 5 mg by mouth 2 (two) times daily.  Vitamin D3 1000 units Caps Take 1 capsule by mouth daily.       Today   VITAL SIGNS:  Blood pressure (!) 148/60, pulse 77, temperature 97.8 F (36.6 C), temperature source Oral, resp. rate 16, height 5' (1.524 m), weight 41.6 kg (91 lb 12.8 oz), SpO2 94 %.  I/O:    Intake/Output Summary (Last 24 hours) at 12/27/2017 0808 Last data filed at 12/26/2017 1144 Gross per 24 hour  Intake 220 ml  Output -  Net 220 ml    PHYSICAL EXAMINATION:  Physical Exam  GENERAL:  84 y.o.-year-old patient lying in the bed with no acute distress.  LUNGS: Normal breath sounds bilaterally, no wheezing, rales,rhonchi or crepitation. No use of accessory muscles of  respiration.  CARDIOVASCULAR: S1, S2 normal. No murmurs, rubs, or gallops.  ABDOMEN: Soft, non-tender, non-distended. Bowel sounds present. No organomegaly or mass.  NEUROLOGIC: Moves all 4 extremities. PSYCHIATRIC: The patient is pleasant SKIN: No obvious rash, lesion, or ulcer.   DATA REVIEW:   CBC Recent Labs  Lab 12/25/17 0554  WBC 9.5  HGB 9.1*  HCT 26.6*  PLT 243    Chemistries  Recent Labs  Lab 12/25/17 0554  NA 139  K 3.1*  CL 100*  CO2 32  GLUCOSE 94  BUN 19  CREATININE 0.86  CALCIUM 7.6*  AST 19  ALT 9*  ALKPHOS 55  BILITOT 0.9    Cardiac Enzymes Recent Labs  Lab 12/24/17 1015  TROPONINI <0.03    Microbiology Results  Results for orders placed or performed during the hospital encounter of 12/24/17  Urine culture     Status: Abnormal   Collection Time: 12/24/17 10:15 AM  Result Value Ref Range Status   Specimen Description   Final    URINE, RANDOM Performed at [facility], [address] [place], [place] [address]    Special Requests   Final    NONE Performed at [facility], [address] [place], [place] [address]    Culture >=100,000 COLONIES/mL ESCHERICHIA COLI (A)  Final   Report Status 12/26/2017 FINAL  Final   Organism ID, Bacteria ESCHERICHIA COLI (A)  Final      Susceptibility   Escherichia coli - MIC*    AMPICILLIN >=32 RESISTANT Resistant     CEFAZOLIN <=4 SENSITIVE Sensitive     CEFTRIAXONE <=1 SENSITIVE Sensitive     CIPROFLOXACIN <=0.25 SENSITIVE Sensitive     GENTAMICIN <=1 SENSITIVE Sensitive     IMIPENEM <=0.25 SENSITIVE Sensitive     NITROFURANTOIN <=16 SENSITIVE Sensitive     TRIMETH/SULFA <=20 SENSITIVE Sensitive     AMPICILLIN/SULBACTAM 16 INTERMEDIATE Intermediate     PIP/TAZO <=4 SENSITIVE Sensitive     Extended ESBL NEGATIVE Sensitive     * >=100,000 COLONIES/mL ESCHERICHIA COLI  Blood Culture (routine x 2)     Status: None (Preliminary result)   Collection Time: 12/24/17 10:48 AM   Result Value Ref Range Status   Specimen Description BLOOD BLOOD LEFT FOREARM  Final   Special Requests   Final    BOTTLES DRAWN AEROBIC AND ANAEROBIC Blood Culture adequate volume   Culture   Final    NO GROWTH 3 DAYS Performed at [facility], [address] [place], [place] [address]    Report Status PENDING  Incomplete  Blood Culture (routine x 2)     Status: None (Preliminary result)   Collection Time: 12/24/17 10:48 AM  Result Value Ref Range Status   Specimen  Description BLOOD LEFT ANTECUBITAL  Final   Special Requests   Final    BOTTLES DRAWN AEROBIC AND ANAEROBIC Blood Culture adequate volume   Culture   Final    NO GROWTH 3 DAYS Performed at [facility], [address] [place], [place] [address]    Report Status PENDING  Incomplete  MRSA PCR Screening     Status: None   Collection Time: 12/24/17  2:29 PM  Result Value Ref Range Status   MRSA by PCR NEGATIVE NEGATIVE Final    Comment:        The GeneXpert MRSA Assay (FDA approved for NASAL specimens only), is one component of a comprehensive MRSA colonization surveillance program. It is not intended to diagnose MRSA infection nor to guide or monitor treatment for MRSA infections. Performed at [facility], [address] [place], [place] [address]     RADIOLOGY:  No results found.  Follow up with PCP in 1 week.  Management plans discussed with the patient, family and they are in agreement.  CODE STATUS:     Code Status Orders  (From admission, onward)        Start     Ordered   12/24/17 1307  Do not attempt resuscitation (DNR)  Continuous    Question Answer Comment  In the event of cardiac or respiratory ARREST Do not call a "code blue"   In the event of cardiac or respiratory ARREST Do not perform Intubation, CPR, defibrillation or ACLS   In the event of cardiac or respiratory ARREST Use medication by any route, position, wound care, and other measures to relive  pain and suffering. May use oxygen, suction and manual treatment of airway obstruction as needed for comfort.   Comments RN May pronounce      12/24/17 1306    Code Status History    Date Active Date Inactive Code Status Order ID Comments User Context   10/12/2017 2103 10/16/2017 1717 DNR [id number]  Enedina Finner, MD Inpatient   06/05/2016 2345 06/07/2016 0938 DNR [id number]  Tonye Royalty, DO Inpatient   06/05/2016 2317 06/05/2016 2345 Full Code [id number]  Tonye Royalty, DO Inpatient   12/05/2015 1308 12/07/2015 1633 DNR [id number]  Milagros Loll, MD ED    Advance Directive Documentation     Most Recent Value  Type of Advance Directive  Healthcare Power of Attorney  Pre-existing out of facility DNR order (yellow form or pink MOST form)  -  "MOST" Form in Place?  -      TOTAL TIME TAKING CARE OF THIS PATIENT ON DAY OF DISCHARGE: more than 30 minutes.   Molinda Bailiff Kately Graffam M.D on 12/27/2017 at 8:08 AM  Between 7am to 6pm - Pager - [phone]  After 6pm go to [email or web address] - password [email or web address]  [facility]  Office  [phone]  CC: Primary care physician; Raynelle Bring  Note: This dictation was prepared with Dragon dictation along with smaller phrase technology. Any transcriptional errors that result from this process are unintentional.

## 2017-12-27 NOTE — Progress Notes (Signed)
PT Cancellation Note  Patient Details Name: Jaclyn ShaggyHilda C Magnone MRN: [id number] DOB: 11/13/1919   Cancelled Treatment:    Reason Eval/Treat Not Completed: Other (comment). No needs identified per CSW. Will sign off.   Danea Manter 12/27/2017, 10:15 AM  Elizabeth PalauStephanie Claretta Kendra, PT, DPT [phone]

## 2017-12-29 LAB — CULTURE, BLOOD (ROUTINE X 2)
CULTURE: NO GROWTH
Culture: NO GROWTH
Special Requests: ADEQUATE
Special Requests: ADEQUATE

## 2018-07-03 ENCOUNTER — Inpatient Hospital Stay: Payer: Medicare Other

## 2018-07-03 ENCOUNTER — Inpatient Hospital Stay
Admit: 2018-07-03 | Discharge: 2018-07-03 | Disposition: A | Discharge status: Home / Self Care | Payer: Medicare Other | Attending: Internal Medicine | Admitting: Internal Medicine

## 2018-07-03 ENCOUNTER — Encounter: Payer: Self-pay | Admitting: Emergency Medicine

## 2018-07-03 ENCOUNTER — Inpatient Hospital Stay
Admission: EM | Admit: 2018-07-03 | Discharge: 2018-07-07 | DRG: 871 | Disposition: A | Discharge status: Skilled Nursing Facility | Payer: Medicare Other | Attending: Internal Medicine | Admitting: Internal Medicine

## 2018-07-03 ENCOUNTER — Other Ambulatory Visit: Payer: Self-pay

## 2018-07-03 ENCOUNTER — Emergency Department: Payer: Medicare Other

## 2018-07-03 DIAGNOSIS — J440 Chronic obstructive pulmonary disease with acute lower respiratory infection: Secondary | ICD-10-CM | POA: Diagnosis present

## 2018-07-03 DIAGNOSIS — I10 Essential (primary) hypertension: Secondary | ICD-10-CM | POA: Diagnosis present

## 2018-07-03 DIAGNOSIS — Z515 Encounter for palliative care: Secondary | ICD-10-CM | POA: Diagnosis present

## 2018-07-03 DIAGNOSIS — Z7189 Other specified counseling: Secondary | ICD-10-CM | POA: Diagnosis not present

## 2018-07-03 DIAGNOSIS — E876 Hypokalemia: Secondary | ICD-10-CM | POA: Diagnosis present

## 2018-07-03 DIAGNOSIS — N390 Urinary tract infection, site not specified: Secondary | ICD-10-CM | POA: Diagnosis present

## 2018-07-03 DIAGNOSIS — I482 Chronic atrial fibrillation, unspecified: Secondary | ICD-10-CM | POA: Diagnosis present

## 2018-07-03 DIAGNOSIS — J189 Pneumonia, unspecified organism: Secondary | ICD-10-CM | POA: Diagnosis present

## 2018-07-03 DIAGNOSIS — I350 Nonrheumatic aortic (valve) stenosis: Secondary | ICD-10-CM | POA: Diagnosis present

## 2018-07-03 DIAGNOSIS — M810 Age-related osteoporosis without current pathological fracture: Secondary | ICD-10-CM | POA: Diagnosis present

## 2018-07-03 DIAGNOSIS — A419 Sepsis, unspecified organism: Secondary | ICD-10-CM | POA: Diagnosis not present

## 2018-07-03 DIAGNOSIS — K219 Gastro-esophageal reflux disease without esophagitis: Secondary | ICD-10-CM | POA: Diagnosis present

## 2018-07-03 DIAGNOSIS — J8410 Pulmonary fibrosis, unspecified: Secondary | ICD-10-CM | POA: Diagnosis present

## 2018-07-03 DIAGNOSIS — Z881 Allergy status to other antibiotic agents status: Secondary | ICD-10-CM

## 2018-07-03 DIAGNOSIS — A4150 Gram-negative sepsis, unspecified: Secondary | ICD-10-CM | POA: Diagnosis present

## 2018-07-03 DIAGNOSIS — Z9981 Dependence on supplemental oxygen: Secondary | ICD-10-CM

## 2018-07-03 DIAGNOSIS — D638 Anemia in other chronic diseases classified elsewhere: Secondary | ICD-10-CM | POA: Diagnosis present

## 2018-07-03 DIAGNOSIS — E43 Unspecified severe protein-calorie malnutrition: Secondary | ICD-10-CM | POA: Diagnosis present

## 2018-07-03 DIAGNOSIS — Z96649 Presence of unspecified artificial hip joint: Secondary | ICD-10-CM | POA: Diagnosis present

## 2018-07-03 DIAGNOSIS — Z79899 Other long term (current) drug therapy: Secondary | ICD-10-CM

## 2018-07-03 DIAGNOSIS — F0390 Unspecified dementia without behavioral disturbance: Secondary | ICD-10-CM | POA: Diagnosis present

## 2018-07-03 DIAGNOSIS — J9621 Acute and chronic respiratory failure with hypoxia: Secondary | ICD-10-CM | POA: Diagnosis present

## 2018-07-03 DIAGNOSIS — I214 Non-ST elevation (NSTEMI) myocardial infarction: Secondary | ICD-10-CM | POA: Diagnosis present

## 2018-07-03 DIAGNOSIS — R1310 Dysphagia, unspecified: Secondary | ICD-10-CM | POA: Diagnosis present

## 2018-07-03 DIAGNOSIS — Z66 Do not resuscitate: Secondary | ICD-10-CM

## 2018-07-03 DIAGNOSIS — J9690 Respiratory failure, unspecified, unspecified whether with hypoxia or hypercapnia: Secondary | ICD-10-CM

## 2018-07-03 DIAGNOSIS — Z7951 Long term (current) use of inhaled steroids: Secondary | ICD-10-CM

## 2018-07-03 DIAGNOSIS — H409 Unspecified glaucoma: Secondary | ICD-10-CM | POA: Diagnosis present

## 2018-07-03 DIAGNOSIS — Z681 Body mass index (BMI) 19 or less, adult: Secondary | ICD-10-CM

## 2018-07-03 DIAGNOSIS — I700 Atherosclerosis of aorta: Secondary | ICD-10-CM | POA: Diagnosis present

## 2018-07-03 DIAGNOSIS — E1165 Type 2 diabetes mellitus with hyperglycemia: Secondary | ICD-10-CM | POA: Diagnosis present

## 2018-07-03 HISTORY — DX: Unspecified dementia, unspecified severity, without behavioral disturbance, psychotic disturbance, mood disturbance, and anxiety: F03.90

## 2018-07-03 HISTORY — DX: Chronic obstructive pulmonary disease, unspecified: J44.9

## 2018-07-03 LAB — CBC WITH DIFFERENTIAL/PLATELET
BASOS PCT: 0 %
Basophils Absolute: 0.1 10*3/uL (ref 0–0.1)
EOS ABS: 0 10*3/uL (ref 0–0.7)
Eosinophils Relative: 0 %
HCT: 31.2 % — ABNORMAL LOW (ref 35.0–47.0)
HEMOGLOBIN: 11 g/dL — AB (ref 12.0–16.0)
Lymphocytes Relative: 9 %
Lymphs Abs: 1.8 10*3/uL (ref 1.0–3.6)
MCH: 34.9 pg — ABNORMAL HIGH (ref 26.0–34.0)
MCHC: 35.3 g/dL (ref 32.0–36.0)
MCV: 98.7 fL (ref 80.0–100.0)
Monocytes Absolute: 0.9 10*3/uL (ref 0.2–0.9)
Monocytes Relative: 5 %
NEUTROS PCT: 86 %
Neutro Abs: 17.5 10*3/uL — ABNORMAL HIGH (ref 1.4–6.5)
Platelets: 245 10*3/uL (ref 150–440)
RBC: 3.16 MIL/uL — AB (ref 3.80–5.20)
RDW: 13 % (ref 11.5–14.5)
WBC: 20.4 10*3/uL — AB (ref 3.6–11.0)

## 2018-07-03 LAB — BRAIN NATRIURETIC PEPTIDE: B Natriuretic Peptide: 1264 pg/mL — ABNORMAL HIGH (ref 0.0–100.0)

## 2018-07-03 LAB — COMPREHENSIVE METABOLIC PANEL
ALT: 9 U/L (ref 0–44)
AST: 27 U/L (ref 15–41)
Albumin: 3 g/dL — ABNORMAL LOW (ref 3.5–5.0)
Alkaline Phosphatase: 69 U/L (ref 38–126)
Anion gap: 11 (ref 5–15)
BUN: 24 mg/dL — ABNORMAL HIGH (ref 8–23)
CALCIUM: 9.1 mg/dL (ref 8.9–10.3)
CO2: 31 mmol/L (ref 22–32)
CREATININE: 0.77 mg/dL (ref 0.44–1.00)
Chloride: 97 mmol/L — ABNORMAL LOW (ref 98–111)
Glucose, Bld: 103 mg/dL — ABNORMAL HIGH (ref 70–99)
Potassium: 3.9 mmol/L (ref 3.5–5.1)
Sodium: 139 mmol/L (ref 135–145)
Total Bilirubin: 1.2 mg/dL (ref 0.3–1.2)
Total Protein: 7.2 g/dL (ref 6.5–8.1)

## 2018-07-03 LAB — URINALYSIS, COMPLETE (UACMP) WITH MICROSCOPIC
BILIRUBIN URINE: NEGATIVE
Glucose, UA: NEGATIVE mg/dL
Ketones, ur: NEGATIVE mg/dL
Nitrite: NEGATIVE
Protein, ur: 30 mg/dL — AB
Specific Gravity, Urine: 1.014 (ref 1.005–1.030)
WBC, UA: >50 WBC/hpf — ABNORMAL HIGH (ref 0–5)
pH: 5 (ref 5.0–8.0)

## 2018-07-03 LAB — PROCALCITONIN: Procalcitonin: 0.21 ng/mL

## 2018-07-03 LAB — TROPONIN I: TROPONIN I: 3.03 ng/mL — AB (ref <0.03)

## 2018-07-03 LAB — HEPARIN LEVEL (UNFRACTIONATED): Heparin Unfractionated: 0.29 IU/mL — ABNORMAL LOW (ref 0.30–0.70)

## 2018-07-03 LAB — ECHOCARDIOGRAM COMPLETE
Height: 60 in
WEIGHTICAEL: 1276.9 [oz_av]

## 2018-07-03 LAB — PROTIME-INR
INR: 1.1
PROTHROMBIN TIME: 14.1 s (ref 11.4–15.2)

## 2018-07-03 LAB — APTT: APTT: 35 s (ref 24–36)

## 2018-07-03 LAB — LACTIC ACID, PLASMA: LACTIC ACID, VENOUS: 0.8 mmol/L (ref 0.5–1.9)

## 2018-07-03 LAB — GLUCOSE, CAPILLARY: Glucose-Capillary: 87 mg/dL (ref 70–99)

## 2018-07-03 LAB — PHOSPHORUS: PHOSPHORUS: 3.2 mg/dL (ref 2.5–4.6)

## 2018-07-03 LAB — MRSA PCR SCREENING: MRSA BY PCR: POSITIVE — AB

## 2018-07-03 LAB — STREP PNEUMONIAE URINARY ANTIGEN: STREP PNEUMO URINARY ANTIGEN: NEGATIVE

## 2018-07-03 LAB — MAGNESIUM: Magnesium: 1.8 mg/dL (ref 1.7–2.4)

## 2018-07-03 LAB — LIPASE, BLOOD: Lipase: 26 U/L (ref 11–51)

## 2018-07-03 MED ORDER — VANCOMYCIN HCL 500 MG IV SOLR
500.0000 mg | INTRAVENOUS | Status: DC
  Filled 2018-07-03: qty 500

## 2018-07-03 MED ORDER — HEPARIN BOLUS VIA INFUSION
2500.0000 [IU] | Freq: Once | INTRAVENOUS | Status: AC
  Administered 2018-07-03: 2500 [IU] via INTRAVENOUS
  Filled 2018-07-03: qty 2500

## 2018-07-03 MED ORDER — BISACODYL 5 MG PO TBEC
5.0000 mg | DELAYED_RELEASE_TABLET | Freq: Every day | ORAL | Status: DC | PRN
  Filled 2018-07-03: qty 1

## 2018-07-03 MED ORDER — VANCOMYCIN HCL IN DEXTROSE 1-5 GM/200ML-% IV SOLN
1000.0000 mg | Freq: Once | INTRAVENOUS | Status: AC
  Administered 2018-07-03: 1000 mg via INTRAVENOUS
  Filled 2018-07-03: qty 200

## 2018-07-03 MED ORDER — FAMOTIDINE 20 MG PO TABS: 20.0000 mg | ORAL_TABLET | Freq: Every day | ORAL | Status: DC

## 2018-07-03 MED ORDER — ACETAMINOPHEN 325 MG PO TABS: 650.0000 mg | ORAL_TABLET | ORAL | Status: DC | PRN

## 2018-07-03 MED ORDER — SENNOSIDES-DOCUSATE SODIUM 8.6-50 MG PO TABS: 1.0000 | ORAL_TABLET | Freq: Every evening | ORAL | Status: DC | PRN

## 2018-07-03 MED ORDER — IPRATROPIUM-ALBUTEROL 0.5-2.5 (3) MG/3ML IN SOLN: 3.0000 mL | RESPIRATORY_TRACT | Status: DC | PRN

## 2018-07-03 MED ORDER — ONDANSETRON HCL 4 MG/2ML IJ SOLN: 4.0000 mg | Freq: Four times a day (QID) | INTRAMUSCULAR | Status: DC | PRN

## 2018-07-03 MED ORDER — SODIUM CHLORIDE 0.9 % IV SOLN
2.0000 g | Freq: Once | INTRAVENOUS | Status: AC
  Administered 2018-07-03: 2 g via INTRAVENOUS
  Filled 2018-07-03: qty 2

## 2018-07-03 MED ORDER — SODIUM CHLORIDE 0.9 % IV BOLUS (SEPSIS)
1000.0000 mL | Freq: Once | INTRAVENOUS | Status: AC
  Administered 2018-07-03: 1000 mL via INTRAVENOUS

## 2018-07-03 MED ORDER — NITROGLYCERIN 0.4 MG SL SUBL: 0.4000 mg | SUBLINGUAL_TABLET | SUBLINGUAL | Status: DC | PRN

## 2018-07-03 MED ORDER — VANCOMYCIN HCL 500 MG IV SOLR: 500.0000 mg | INTRAVENOUS | Status: DC

## 2018-07-03 MED ORDER — ATORVASTATIN CALCIUM 20 MG PO TABS: 40.0000 mg | ORAL_TABLET | Freq: Every day | ORAL | Status: DC

## 2018-07-03 MED ORDER — DEXTROSE IN LACTATED RINGERS 5 % IV SOLN
INTRAVENOUS | Status: DC
  Administered 2018-07-03 – 2018-07-07 (×4): via INTRAVENOUS

## 2018-07-03 MED ORDER — ASPIRIN EC 325 MG PO TBEC
325.0000 mg | DELAYED_RELEASE_TABLET | Freq: Once | ORAL | Status: DC
  Filled 2018-07-03: qty 1

## 2018-07-03 MED ORDER — MORPHINE SULFATE (PF) 2 MG/ML IV SOLN: 2.0000 mg | INTRAVENOUS | Status: DC | PRN

## 2018-07-03 MED ORDER — HEPARIN (PORCINE) IN NACL 100-0.45 UNIT/ML-% IJ SOLN
700.0000 [IU]/h | INTRAMUSCULAR | Status: DC
  Administered 2018-07-03: 600 [IU]/h via INTRAVENOUS
  Administered 2018-07-04: 14:00:00 700 [IU]/h via INTRAVENOUS
  Filled 2018-07-03 (×2): qty 250

## 2018-07-03 MED ORDER — SODIUM CHLORIDE 0.9 % IV SOLN
1.0000 g | INTRAVENOUS | Status: DC
  Administered 2018-07-03: 1 g via INTRAVENOUS
  Filled 2018-07-03 (×2): qty 1

## 2018-07-03 MED ORDER — IOHEXOL 350 MG/ML SOLN
75.0000 mL | Freq: Once | INTRAVENOUS | Status: AC | PRN
  Administered 2018-07-03: 75 mL via INTRAVENOUS

## 2018-07-03 MED ORDER — FAMOTIDINE IN NACL 20-0.9 MG/50ML-% IV SOLN
20.0000 mg | INTRAVENOUS | Status: DC
  Administered 2018-07-03 – 2018-07-04 (×2): 20 mg via INTRAVENOUS
  Filled 2018-07-03 (×2): qty 50

## 2018-07-03 MED ORDER — PANTOPRAZOLE SODIUM 40 MG PO TBEC: 40.0000 mg | DELAYED_RELEASE_TABLET | Freq: Two times a day (BID) | ORAL | Status: DC

## 2018-07-03 MED ORDER — VANCOMYCIN HCL IN DEXTROSE 750-5 MG/150ML-% IV SOLN: 750.0000 mg | INTRAVENOUS | Status: DC

## 2018-07-03 MED ORDER — DEXTROSE 5 % IV SOLN
500.0000 mg | Freq: Two times a day (BID) | INTRAVENOUS | Status: DC
  Filled 2018-07-03: qty 0.5

## 2018-07-03 MED ORDER — METOPROLOL TARTRATE 25 MG PO TABS
12.5000 mg | ORAL_TABLET | Freq: Two times a day (BID) | ORAL | Status: DC
  Administered 2018-07-03 – 2018-07-07 (×8): 12.5 mg via ORAL
  Filled 2018-07-03 (×9): qty 1

## 2018-07-03 MED ORDER — HEPARIN BOLUS VIA INFUSION
600.0000 [IU] | Freq: Once | INTRAVENOUS | Status: AC
  Administered 2018-07-03: 600 [IU] via INTRAVENOUS
  Filled 2018-07-03: qty 600

## 2018-07-03 MED ORDER — MUPIROCIN 2 % EX OINT
1.0000 "application " | TOPICAL_OINTMENT | Freq: Two times a day (BID) | CUTANEOUS | Status: DC
  Administered 2018-07-04 – 2018-07-07 (×7): 1 via NASAL
  Filled 2018-07-03 (×2): qty 22

## 2018-07-03 MED ORDER — CHLORHEXIDINE GLUCONATE CLOTH 2 % EX PADS
6.0000 | MEDICATED_PAD | Freq: Every day | CUTANEOUS | Status: DC
  Administered 2018-07-05 – 2018-07-07 (×3): 6 via TOPICAL

## 2018-07-03 MED ORDER — ASPIRIN 81 MG PO CHEW
81.0000 mg | CHEWABLE_TABLET | Freq: Every day | ORAL | Status: DC
  Administered 2018-07-03 – 2018-07-07 (×5): 81 mg via ORAL
  Filled 2018-07-03 (×5): qty 1

## 2018-07-03 NOTE — Progress Notes (Signed)
CODE SEPSIS - PHARMACY COMMUNICATION  **Broad Spectrum Antibiotics should be administered within 1 hour of Sepsis diagnosis**  Time Code Sepsis Called/Page Received: 1610  Antibiotics Ordered: vanc/cefepime  Time of 1st antibiotic administration: 0450  Additional action taken by pharmacy:   If necessary, Name of Provider/Nurse Contacted:     Thomasene Ripple ,PharmD Clinical Pharmacist  07/03/2018  6:04 AM

## 2018-07-03 NOTE — Progress Notes (Signed)
Advanced Care Plan.  Purpose of Encounter: Palliative care and comfort care. Parties in Attendance: The patient, her son and me. Patient's Decisional Capacity: No. Medical Story: Kelly Booth  is a 84 y.o. female with a known history of COPD, GERD, Afib (no AC, Hx GIB 06/2016), SSS (no PPM) p/w tachypnea/SOB, AMS.  She is admitted for acute respiratory failure, sepsis due to UTI and pneumonia, non-STEMI.  She is lethargic and unresponsive to stimuli.  I discussed with her son about her critical condition, very poor prognosis, palliative care and comfort care.  He said he will consider comfort care. Goals of Care Determinations: Comfort care or hospice care. Plan:  Code Status: DNR. Time spent discussing advance care planning: 18 minutes.

## 2018-07-03 NOTE — ED Provider Notes (Signed)
[facility] Emergency Department Provider Note  ____________________________________________   First MD Initiated Contact with Patient 07/03/18 [phone]     (approximate)  I have reviewed the triage vital signs and the nursing notes.   HISTORY  Chief Complaint Shortness of Breath  Level 5 caveat:  history/ROS limited by chronic dementia  HPI Kelly Booth is a 84 y.o. female with extensive chronic medical history and who has a DNR in place, confirmed by her son who is her healthcare power of attorney and is at her bedside.  Reportedly she was not acting herself over the last 24 hours with decreased responsiveness although reportedly her level of responsiveness does come and go due to her dementia.  Her level of consciousness has decreased over the course of the last 24 hours, gradually but has become severe.  She was also reported to have some shortness of breath and was found to be 85% on room air by EMS, however they thought it might have been somewhat positional due to chronic kyphosis.  The patient is not able to provide any history and is not responding to me although her son said that she smiled at him when he entered the room.  No additional history is available.  Past Medical History:  Diagnosis Date  . A-fib (HCC)   . Allergic rhinitis   . Constipation   . Dyspepsia   . Esophageal reflux   . Generalized weakness   . Glaucoma   . Hypertension   . Osteoporosis   . Pulmonary fibrosis (HCC)    There is no evidence of pulmonary fibrosis on CT chest    Patient Active Problem List   Diagnosis Date Noted  . Acute on chronic respiratory failure with hypoxemia (HCC) 07/03/2018  . Sepsis (HCC) 12/24/2017  . Acute respiratory failure with hypoxia (HCC) 10/13/2017  . Acute low back pain 10/12/2017  . Compression fracture of body of thoracic vertebra (HCC) 10/12/2017  . Acute respiratory failure with hypoxia and hypercapnia (HCC) 06/11/2016  . Acute  diastolic CHF (congestive heart failure) (HCC) 06/11/2016  . Bilateral pleural effusion 06/11/2016  . Severe aortic stenosis 06/11/2016  . Acute posthemorrhagic anemia 06/11/2016  . Acute renal insufficiency 06/11/2016  . A. fib, RVR 06/11/2016  . Atrial fibrillation with RVR (HCC) 06/11/2016  . Elevated troponin 06/11/2016  . UTI (urinary tract infection) 06/11/2016  . DNR (do not resuscitate) 06/09/2016  . Palliative care by specialist 06/09/2016  . Weakness generalized 06/09/2016  . Dyspnea   . Bleeding gastrointestinal 06/05/2016  . Malnutrition of moderate degree 12/06/2015  . Bradycardia 12/05/2015    Past Surgical History:  Procedure Laterality Date  . ABDOMINAL HYSTERECTOMY    . TOTAL HIP ARTHROPLASTY      Prior to Admission medications   Medication Sig Start Date End Date Taking? Authorizing Provider  acetaminophen (TYLENOL) 160 MG/5ML elixir Take 15 mg/kg by mouth every 4 (four) hours as needed for fever.   Yes [provider]  albuterol (PROVENTIL) (2.5 MG/3ML) 0.083% nebulizer solution Take 3 mLs (2.5 mg total) by nebulization every 6 (six) hours as needed for wheezing or shortness of breath. 10/15/17  Yes Enedina Finner, MD  bisacodyl (STIMULANT LAXATIVE) 5 MG EC tablet Take 5 mg by mouth 2 (two) times daily.  09/18/15  Yes [provider]  Calcium Carbonate-Vitamin D (CALCIUM-VITAMIN D) 500-200 MG-UNIT tablet Take 1 tablet by mouth 2 (two) times daily. 09/18/15  Yes [provider]  Cholecalciferol (VITAMIN D3) 1000 units  CAPS Take 1 capsule by mouth daily. 09/18/15  Yes [provider]  ferrous sulfate 325 (65 FE) MG tablet Take 1 tablet by mouth daily. 09/18/15  Yes [provider]  furosemide (LASIX) 40 MG tablet Take 40 mg by mouth daily.  09/18/15  Yes [provider]  Ipratropium-Albuterol (COMBIVENT) 20-100 MCG/ACT AERS respimat Inhale 1 puff into the lungs 4 (four) times daily. 09/18/15  Yes [provider]  latanoprost (XALATAN) 0.005 % ophthalmic solution Apply 1 drop to eye at bedtime. 09/18/15  Yes [provider]  metoprolol tartrate (LOPRESSOR) 25 MG tablet Take 0.5 tablets (12.5 mg total) by mouth 2 (two) times daily. 06/11/16  Yes Katharina Caper, MD  omeprazole (PRILOSEC) 20 MG capsule Take 20 mg by mouth 2 (two) times daily before a meal.   Yes [provider]  pantoprazole (PROTONIX) 40 MG tablet Take 1 tablet (40 mg total) by mouth 2 (two) times daily before a meal. 06/11/16  Yes Katharina Caper, MD  polyvinyl alcohol (ARTIFICIAL TEARS) 1.4 % ophthalmic solution Apply 1 drop to eye 4 (four) times daily as needed. 09/18/15  Yes [provider]  potassium chloride (KLOR-CON) 20 MEQ packet Take 20 mEq by mouth daily.   Yes [provider]  ranitidine (ZANTAC) 150 MG tablet Take 1 tablet by mouth at bedtime.  09/18/15  Yes [provider]    Allergies Ceclor [cefaclor]  Family History  Problem Relation Age of Onset  . Hypertension Other     Social History Social History   Tobacco Use  . Smoking status: Never Smoker  . Smokeless tobacco: Never Used  Substance Use Topics  . Alcohol use: No    Alcohol/week: 0.0 standard drinks  . Drug use: No    Review of Systems Level 5 caveat:  history/ROS limited by chronic dementia ____________________________________________   PHYSICAL EXAM:  VITAL SIGNS: ED Triage Vitals  Enc Vitals Group     BP 07/03/18 0312 (!) 141/55     Pulse Rate 07/03/18 0312 87     Resp 07/03/18 0312 (!) 40     Temp 07/03/18 0312 98.6 F (37 C)     Temp Source 07/03/18 0312 Oral     SpO2 07/03/18 0301 (!) 85 %     Weight 07/03/18 0313 41.7 kg (92 lb)     Height 07/03/18 0313 1.524 m (5')     Head Circumference --      Peak Flow --      Pain Score --      Pain Loc --      Pain Edu? --      Excl. in GC? --     Constitutional: Elderly, frail, ill-appearing Eyes: Conjunctivae are normal.  Head:  Atraumatic. Neck: No stridor.  No meningeal signs.   Cardiovascular: Normal rate, regular rhythm. Good peripheral circulation. Grossly normal heart sounds. Respiratory: Tachypnea but no increased work of breathing.  Left side wheezing and coarse breath sounds. Gastrointestinal: Cachectic.  Soft and nontender. No distention.  Musculoskeletal: No lower extremity tenderness nor edema. No gross deformities of extremities. Neurologic: Unable to participate in neurological exam Skin:  Skin is warm, dry and intact. No rash noted.   ____________________________________________   LABS (all labs ordered are listed, but only abnormal results are displayed)  Labs Reviewed  COMPREHENSIVE METABOLIC PANEL - Abnormal; Notable for the following components:      Result Value   Chloride 97 (*)    Glucose, Bld 103 (*)  BUN 24 (*)    Albumin 3.0 (*)    All other components within normal limits  CBC WITH DIFFERENTIAL/PLATELET - Abnormal; Notable for the following components:   WBC 20.4 (*)    RBC 3.16 (*)    Hemoglobin 11.0 (*)    HCT 31.2 (*)    MCH 34.9 (*)    Neutro Abs 17.5 (*)    All other components within normal limits  TROPONIN I - Abnormal; Notable for the following components:   Troponin I 3.03 (*)    All other components within normal limits  URINALYSIS, COMPLETE (UACMP) WITH MICROSCOPIC - Abnormal; Notable for the following components:   Color, Urine YELLOW (*)    APPearance CLOUDY (*)    Hgb urine dipstick MODERATE (*)    Protein, ur 30 (*)    Leukocytes, UA MODERATE (*)    WBC, UA >50 (*)    Bacteria, UA MANY (*)    All other components within normal limits  BRAIN NATRIURETIC PEPTIDE - Abnormal; Notable for the following components:   B Natriuretic Peptide 1,264.0 (*)    All other components within normal limits  CULTURE, BLOOD (ROUTINE X 2)  CULTURE, BLOOD (ROUTINE X 2)  URINE CULTURE  EXPECTORATED SPUTUM ASSESSMENT W REFEX TO RESP CULTURE  GRAM STAIN  LACTIC ACID,  PLASMA  LIPASE, BLOOD  PROCALCITONIN  PHOSPHORUS  MAGNESIUM  APTT  PROTIME-INR  HEPARIN LEVEL (UNFRACTIONATED)  STREP PNEUMONIAE URINARY ANTIGEN  LEGIONELLA PNEUMOPHILA SEROGP 1 UR AG   ____________________________________________  EKG  ED ECG REPORT I, Loleta Rose, the attending physician, personally viewed and interpreted this ECG.  Date: 07/03/2018 EKG Time: 03:25 Rate: 81 Rhythm: normal sinus rhythm QRS Axis: RAD Intervals: normal ST/T Wave abnormalities: Non-specific ST segment / T-wave changes, inverted T waves in V4-V6 and I & II.  Changed from prior  Narrative Interpretation: concerning for acute ischemia   ____________________________________________  RADIOLOGY I, Loleta Rose, personally viewed and evaluated these images (plain radiographs) as part of my medical decision making, as well as reviewing the written report by the radiologist.  ED MD interpretation: No focal consolidation  Official radiology report(s): Ct Angio Chest Pe W Or Wo Contrast  Result Date: 07/03/2018 CLINICAL DATA:  84 year old female with concern for pulmonary embolism. EXAM: CT ANGIOGRAPHY CHEST WITH CONTRAST TECHNIQUE: Multidetector CT imaging of the chest was performed using the standard protocol during bolus administration of intravenous contrast. Multiplanar CT image reconstructions and MIPs were obtained to evaluate the vascular anatomy. CONTRAST:  75mL OMNIPAQUE IOHEXOL 350 MG/ML SOLN COMPARISON:  Chest radiograph dated 07/03/2018 FINDINGS: Evaluation of this exam is limited due to respiratory motion artifact. Cardiovascular: There is no cardiomegaly or pericardial effusion. There is multi vessel coronary vascular calcification as well as calcification of the mitral annulus. Moderate calcified and noncalcified atherosclerotic plaques of the aorta. Evaluation of the pulmonary vasculature is limited due to respiratory motion artifact. Somewhat linear and nonocclusive peripheral density in  the left lower lobe pulmonary artery branch (series 8, image 95 and coronal series 10, image 45 and 46) most likely represent chronic clot or scarring. No definite CT evidence of acute or occlusive pulmonary artery embolism. Mediastinum/Nodes: No hilar or mediastinal adenopathy. No mediastinal fluid collection. Lungs/Pleura: Small bilateral pleural effusions. There are consolidative changes of the lower lobes, left greater right. Scattered ground-glass and nodular densities predominantly involving the lower lobe as well as lingula most consistent with pneumonia. Biapical subpleural scarring as well as an area of pleural thickening along the  left lateral chest wall. No pneumothorax. The central airways are patent. There is mucous plugging of multiple bilateral lower lobe bronchi. Upper Abdomen: Suboptimally evaluated due to streak artifact. Musculoskeletal: Old healed left rib fractures with associated deformity of the left lateral chest wall. Osteopenia with degenerative changes of the spine. T12 compression fracture with complete loss of vertebral body height and anterior wedging. Compression fractures of the T3, T4, T6, and T9. These fractures are age indeterminate but new since the study of 2015. There is approximately 3 mm retropulsion of the superior posterior cortex of T12. Review of the MIP images confirms the above findings. IMPRESSION: 1. No definite CT evidence of acute pulmonary embolism. Linear nonocclusive density in the left lower lobe pulmonary artery most likely chronic clot or scarring. 2. Small bilateral pleural effusions and bilateral lower lobe partial consolidative changes and findings of pneumonia. Aspiration is not excluded. Clinical correlation is recommended. 3. Mucous plugging or secretions in the bilateral lower lobe bronchi. 4. Multiple compression fractures of the thoracic spine, age indeterminate. Near complete loss of vertebral body height at T12. Electronically Signed   By: Elgie Collard M.D.   On: 07/03/2018 07:06   Dg Chest Portable 1 View  Result Date: 07/03/2018 CLINICAL DATA:  84 year old female with respiratory difficulty. EXAM: PORTABLE CHEST 1 VIEW COMPARISON:  Chest radiograph dated 12/24/2017 FINDINGS: The patient is rotated and tilted. There is emphysematous changes of the lungs with hyperexpansion and flattening of the diaphragms. Small bilateral pleural effusions may be present. No pneumothorax. No lobar consolidative changes. Stable cardiac silhouette. Atherosclerotic calcification of the aorta. Osteopenia. No acute osseous pathology. IMPRESSION: No focal consolidation.  No significant interval change. Electronically Signed   By: Elgie Collard M.D.   On: 07/03/2018 03:42    ____________________________________________   PROCEDURES  Critical Care performed: Yes, see critical care procedure note(s)   Procedure(s) performed:   .Critical Care Performed by: Loleta Rose, MD Authorized by: Loleta Rose, MD   Critical care provider statement:    Critical care time (minutes):  30   Critical care time was exclusive of:  Separately billable procedures and treating other patients   Critical care was necessary to treat or prevent imminent or life-threatening deterioration of the following conditions:  Sepsis and cardiac failure   Critical care was time spent personally by me on the following activities:  Development of treatment plan with patient or surrogate, discussions with consultants, evaluation of patient's response to treatment, examination of patient, obtaining history from patient or surrogate, ordering and performing treatments and interventions, ordering and review of laboratory studies, ordering and review of radiographic studies, pulse oximetry, re-evaluation of patient's condition and review of old charts     ____________________________________________   INITIAL IMPRESSION / ASSESSMENT AND PLAN / ED COURSE  As part of my medical  decision making, I reviewed the following data within the electronic MEDICAL RECORD NUMBER History obtained from family, Nursing notes reviewed and incorporated, EKG interpreted , Old EKG reviewed, Old chart reviewed and Discussed with admitting physician     Differential diagnosis includes, but is not limited to, sepsis most likely due to either urinary tract infection or pneumonia, acute respiratory failure with hypercarbia, ACS, CVA, acute intracranial bleeding.  My main concern is for sepsis.  Labs obtained initially demonstrated white count of more than 20 and the last blood work on record shows white count of only 9 so this is certainly change and quite concerning in a 84 year old.  Vital signs are generally  reassuring other than the tachypnea and the reported hypoxemia but she is currently on room air and does not have any hypoxemia.  I am making her code sepsis but I do not anticipate severe sepsis or septic shock.  I am giving 1 L normal saline because she looks dry as well as empiric antibiotics for healthcare associated pneumonia which include cefepime 2 g IV and vancomycin 1 g IV.  In And out catheterization will be performed for urine specimen.   (Note that documentation was delayed due to multiple ED patients requiring immediate care.)   Additional lab work came back, most notable the troponin of 3.  She also has a BNP of about 1200 and a grossly positive urinary tract infection.  Fortunately her lactic acid is normal.  I believe that she is intravascularly volume depleted and provided a liter normal saline with the understanding that she could develop pulmonary edema but I think it is important she get some fluids.  The cefepime should treat the urinary tract infection.  There is no sign of pneumonia on chest x-ray nor of pulmonary edema even though she does have an elevated BNP.  The troponin qualifies as an NSTEMI although this could be due to demand ischemia in the setting of sepsis.   Regardless the patient has a poor prognosis and I have advised her son of my concerns.  I discussed the case with the hospitalist who will admit.  I am holding off on heparin at this time because the son understands that aggressive and invasive treatment of ACS will likely not be beneficial for this particular patient.     ____________________________________________  FINAL CLINICAL IMPRESSION(S) / ED DIAGNOSES  Final diagnoses:  Sepsis, due to unspecified organism  Urinary tract infection without hematuria, site unspecified  NSTEMI (non-ST elevated myocardial infarction) (HCC)  Dementia without behavioral disturbance, unspecified dementia type (HCC)  DNR (do not resuscitate)     MEDICATIONS GIVEN DURING THIS VISIT:  Medications  aspirin chewable tablet 81 mg (has no administration in time range)  aspirin EC tablet 325 mg (325 mg Oral Not Given 07/03/18 0548)  morphine 2 MG/ML injection 2-4 mg (has no administration in time range)  nitroGLYCERIN (NITROSTAT) SL tablet 0.4 mg (has no administration in time range)  heparin ADULT infusion 100 units/mL (25000 units/281mL sodium chloride 0.45%) (600 Units/hr Intravenous Rate/Dose Verify 07/03/18 0639)  ceFEPIme (MAXIPIME) 500 mg in dextrose 5 % 50 mL IVPB (has no administration in time range)  vancomycin (VANCOCIN) 500 mg in sodium chloride 0.9 % 100 mL IVPB (has no administration in time range)  vancomycin (VANCOCIN) IVPB 1000 mg/200 mL premix (0 mg Intravenous Stopped 07/03/18 0743)  ceFEPIme (MAXIPIME) 2 g in sodium chloride 0.9 % 100 mL IVPB (0 g Intravenous Stopped 07/03/18 0520)  sodium chloride 0.9 % bolus 1,000 mL (0 mLs Intravenous Stopped 07/03/18 0544)  heparin bolus via infusion 2,500 Units (2,500 Units Intravenous Bolus from Bag 07/03/18 0635)  iohexol (OMNIPAQUE) 350 MG/ML injection 75 mL (75 mLs Intravenous Contrast Given 07/03/18 0615)    , ED Discharge Orders    None       Note:  This document was prepared using Dragon  voice recognition software and may include unintentional dictation errors.    Loleta Rose, MD 07/03/18 [phone]

## 2018-07-03 NOTE — Progress Notes (Signed)
ANTICOAGULATION CONSULT NOTE - Initial Consult  Pharmacy Consult for heparin Indication: chest pain/ACS  Allergies  Allergen Reactions  . Ceclor [Cefaclor]     Passed out     Patient Measurements: Height: 5' (152.4 cm) Weight: 79 lb 12.9 oz (36.2 kg) IBW/kg (Calculated) : 45.5 Heparin Dosing Weight: 41.7 kg  Vital Signs: Temp: 98.1 F (36.7 C) (09/29 0812) Temp Source: Axillary (09/29 0812) BP: 127/66 (09/29 1400) Pulse Rate: 69 (09/29 1400)  Labs: Recent Labs    07/03/18 0319 07/03/18 0602 07/03/18 1416  HGB 11.0*  --   --   HCT 31.2*  --   --   PLT 245  --   --   APTT  --  35  --   LABPROT  --  14.1  --   INR  --  1.10  --   HEPARINUNFRC  --   --  0.29*  CREATININE 0.77  --   --   TROPONINI 3.03*  --   --     Estimated Creatinine Clearance: 23.5 mL/min (by C-G formula based on SCr of 0.77 mg/dL).   Medical History: Past Medical History:  Diagnosis Date  . A-fib (HCC)   . Allergic rhinitis   . Constipation   . Dyspepsia   . Esophageal reflux   . Generalized weakness   . Glaucoma   . Hypertension   . Osteoporosis   . Pulmonary fibrosis (HCC)    There is no evidence of pulmonary fibrosis on CT chest    Medications:  Scheduled:  . aspirin  81 mg Oral Daily  . heparin  600 Units Intravenous Once  . metoprolol tartrate  12.5 mg Oral BID    Assessment: Patient admitted for SOB w/ trops 3.03, EKG showing T-wave inversion No PTA anticoagulation Patient being started on heparin drip for possible ACS/UA  Goal of Therapy:  Heparin level 0.3-0.7 units/ml Monitor platelets by anticoagulation protocol: Yes   Plan:  Will bolus heparin 2500 units IV x 1 Will start heparin drip @ 600 units/hr Baseline labs drawn Will check anti-Xa level at 1400 Will monitor daily CBC's and adjust per anti-Xa levels  9/29:  HL @ 1416 = 0.29 Will order Heparin 600 units IV X 1 bolus and increase drip rate to 700 units/hr. Will recheck HL 8 hrs after rate change.    Ian Castagna D Clinical Pharmacist 07/03/2018

## 2018-07-03 NOTE — Progress Notes (Signed)
Pharmacy Antibiotic Note  Kelly Booth is a 84 y.o. female admitted on 07/03/2018 with pneumonia.  Pharmacy has been consulted for vanc/cefepime dosing.  Plan: Will continue vanc 500 mg IV q24h  Will draw trough prior to 4th dose. Will continue cefepime 500 mg IV q12h  Ke 0.0269 T1/2 24 hours Goal trough 15 - 20 mcg/mL  Height: 5' (152.4 cm) Weight: 92 lb (41.7 kg) IBW/kg (Calculated) : 45.5  Temp (24hrs), Avg:98.6 F (37 C), Min:98.6 F (37 C), Max:98.6 F (37 C)  Recent Labs  Lab 07/03/18 0319  WBC 20.4*  CREATININE 0.77    Estimated Creatinine Clearance: 27.1 mL/min (by C-G formula based on SCr of 0.77 mg/dL).    Allergies  Allergen Reactions  . Ceclor [Cefaclor]     Passed out    Thank you for allowing pharmacy to be a part of this patient's care.  Thomasene Ripple, PharmD, BCPS Clinical Pharmacist 07/03/2018

## 2018-07-03 NOTE — Progress Notes (Signed)
Pharmacy Antibiotic Note  Kelly Booth is a 84 y.o. female admitted on 07/03/2018 with UTI.  Pharmacy has been consulted for cefepime dosing.  Plan: Cefepime 1 gm IV Q24H  ADDENDUM: pharmacy also consulted to dose vancomycin for sepsis. Patient received vancomycin 1000 mg IV x 1 this morning. Will space out approximately 41 hours (stacked dosing) then start vancomycin 500 mg IV Q36H, predicted trough 15 mcg/ml. Pharmacy will continue to follow and adjust as needed to maintain trough 15 to 20 mcg/ml. Vd 25.3 L, Ke 0.024 hr-1, T1/2 29 hr  Height: 5' (152.4 cm) Weight: 79 lb 12.9 oz (36.2 kg) IBW/kg (Calculated) : 45.5  Temp (24hrs), Avg:98.4 F (36.9 C), Min:98.1 F (36.7 C), Max:98.6 F (37 C)  Recent Labs  Lab 07/03/18 0319 07/03/18 0408  WBC 20.4*  --   CREATININE 0.77  --   LATICACIDVEN  --  0.8    Estimated Creatinine Clearance: 23.5 mL/min (by C-G formula based on SCr of 0.77 mg/dL).    Allergies  Allergen Reactions  . Ceclor [Cefaclor]     Passed out     Antimicrobials this admission:   Dose adjustments this admission:   Microbiology results:  BCx:   UCx:    Sputum:    MRSA PCR:   Thank you for allowing pharmacy to be a part of this patient's care.  Carola Frost, Pharm.D., BCPS Clinical Pharmacist 07/03/2018 2:42 PM

## 2018-07-03 NOTE — Consult Note (Signed)
PULMONARY/CCM CONSULT NOTE  Requesting MD/Service: Hospitalist Date of initial consultation: 9/29 Reason for consultation: Altered mental status, dyspnea, suspected sepsis  PT PROFILE: 84 y.o. female very frail SNF resident with extensive PMH brought to [facility] ED with altered mental status.  Patient is DNR    MICRO DATA: MRSA PCR 9/29 >> POS Urine 9/29 >>  Blood 9/29 >>   ANTIMICROBIALS:  Vanc 9/29 >>  Cefepime 9/29 >>   HPI:  As above.  Her son notes progressive altered mental status over several days.  It progressed to markedly decreased level of responsiveness and she was transported to the emergency department at the recommendation of SNF personnel.  Patient is unable to provide any history.  Past Medical History:  Diagnosis Date  . A-fib (HCC)   . Allergic rhinitis   . Constipation   . Dyspepsia   . Esophageal reflux   . Generalized weakness   . Glaucoma   . Hypertension   . Osteoporosis   . Pulmonary fibrosis (HCC)    There is no evidence of pulmonary fibrosis on CT chest    Past Surgical History:  Procedure Laterality Date  . ABDOMINAL HYSTERECTOMY    . TOTAL HIP ARTHROPLASTY      MEDICATIONS: I have reviewed all medications and confirmed regimen as documented  Social History   Socioeconomic History  . Marital status: Widowed    Spouse name: Not on file  . Number of children: Not on file  . Years of education: Not on file  . Highest education level: Not on file  Occupational History  . Not on file  Social Needs  . Financial resource strain: Not on file  . Food insecurity:    Worry: Not on file    Inability: Not on file  . Transportation needs:    Medical: Not on file    Non-medical: Not on file  Tobacco Use  . Smoking status: Never Smoker  . Smokeless tobacco: Never Used  Substance and Sexual Activity  . Alcohol use: No    Alcohol/week: 0.0 standard drinks  . Drug use: No  . Sexual activity: Not Currently    Birth control/protection: None   Lifestyle  . Physical activity:    Days per week: Not on file    Minutes per session: Not on file  . Stress: Not on file  Relationships  . Social connections:    Talks on phone: Not on file    Gets together: Not on file    Attends religious service: Not on file    Active member of club or organization: Not on file    Attends meetings of clubs or organizations: Not on file    Relationship status: Not on file  . Intimate partner violence:    Fear of current or ex partner: Not on file    Emotionally abused: Not on file    Physically abused: Not on file    Forced sexual activity: Not on file  Other Topics Concern  . Not on file  Social History Narrative  . Not on file    Family History  Problem Relation Age of Onset  . Hypertension Other     ROS: Level 5 caveat   Vitals:   07/03/18 0812 07/03/18 0900 07/03/18 1000 07/03/18 1200  BP: (!) 156/63 (!) 133/47 128/61 (!) 115/51  Pulse:  69 68 61  Resp:  (!) 24 (!) 28 (!) 26  Temp: 98.1 F (36.7 C)     TempSrc: Axillary  SpO2: 96% 100% 100% 100%  Weight: 36.2 kg     Height: 5' (1.524 m)        EXAM:  Gen: Somnolent, very frail appearing, NAD on [place] O2 HEENT: NCAT, sclera white Neck: Supple, JVP not visualized Lungs: BS generally diminished, few basilar crackles, no wheezes Cardiovascular: Regular, high-pitched systolic murmur radiating to neck Abdomen: Soft, nontender, normal BS Ext: Warm without clubbing, cyanosis, edema Neuro: Depressed LOC, CNs intact, moves all extremities Skin: Limited exam, no lesions noted  DATA:   BMP Latest Ref Rng & Units 07/03/2018 12/25/2017 12/24/2017  Glucose 70 - 99 mg/dL 409(W) 94 -  BUN 8 - 23 mg/dL 11(B) 19 -  Creatinine 0.44 - 1.00 mg/dL 1.47 8.29 5.62  Sodium 135 - 145 mmol/L 139 139 -  Potassium 3.5 - 5.1 mmol/L 3.9 3.1(L) -  Chloride 98 - 111 mmol/L 97(L) 100(L) -  CO2 22 - 32 mmol/L 31 32 -  Calcium 8.9 - 10.3 mg/dL 9.1 1.3(Y) -    CBC Latest Ref Rng & Units  07/03/2018 12/25/2017 12/24/2017  WBC 3.6 - 11.0 K/uL 20.4(H) 9.5 13.5(H)  Hemoglobin 12.0 - 16.0 g/dL 11.0(L) 9.1(L) 10.9(L)  Hematocrit 35.0 - 47.0 % 31.2(L) 26.6(L) 30.9(L)  Platelets 150 - 440 K/uL 245 243 285    CXR: Chronic changes with severe kyphosis and chronically decreased volume in left hemithorax (due to prior chest trauma with multiple rib fractures).  There also appears to be left lower lobe atelectasis.  Small right pleural effusion.  CT chest: No PE, small bilateral pleural effusions, bilateral basilar atelectasis versus infiltrate, mucous plugging in bilateral lower lobe airways  I have personally reviewed all chest radiographs reported above including CXRs and CT chest unless otherwise indicated  IMPRESSION:   Advanced age Advanced frailty SNF resident Acute alteration in mental status Possible pneumonia Pyuria Suspected sepsis Elevated troponin I - likely demand ischemia  She is not a candidate for aggressive intervention History of severe aortic stenosis  PLAN:  Continue supplemental oxygen We can use BiPAP as needed for respiratory distress as long as it does not create more discomfort than it relieves Continue current antibiotic Monitor temp, WBC count Micro and abx as above  DNR is appropriate Maintenance fluid ordered until able to take sufficient p.o. Heparin per pharmacy for ACS  I spoke with patient's son and grandson and confirmed DNR status.  We discussed goals of care.  She has used BiPAP in a previous illness in the past couple of years.  They are willing to use BiPAP if it does not add to her discomfort.  Billy Fischer, MD PCCM service Mobile [phone] Pager [phone] 07/03/2018 2:25 PM

## 2018-07-03 NOTE — ED Notes (Signed)
admitting Provider at bedside. 

## 2018-07-03 NOTE — Progress Notes (Signed)
Pharmacy Antibiotic Note  Kelly Booth is a 84 y.o. female admitted on 07/03/2018 with UTI.  Pharmacy has been consulted for cefepime dosing.  Plan: Cefepime 1 gm IV Q24H  Height: 5' (152.4 cm) Weight: 79 lb 12.9 oz (36.2 kg) IBW/kg (Calculated) : 45.5  Temp (24hrs), Avg:98.4 F (36.9 C), Min:98.1 F (36.7 C), Max:98.6 F (37 C)  Recent Labs  Lab 07/03/18 0319 07/03/18 0408  WBC 20.4*  --   CREATININE 0.77  --   LATICACIDVEN  --  0.8    Estimated Creatinine Clearance: 23.5 mL/min (by C-G formula based on SCr of 0.77 mg/dL).    Allergies  Allergen Reactions  . Ceclor [Cefaclor]     Passed out     Antimicrobials this admission:   Dose adjustments this admission:   Microbiology results:  BCx:   UCx:    Sputum:    MRSA PCR:   Thank you for allowing pharmacy to be a part of this patient's care.  Carola Frost, Pharm.D., BCPS Clinical Pharmacist 07/03/2018 10:10 AM

## 2018-07-03 NOTE — ED Triage Notes (Signed)
Patient presents to Emergency Department via EMS with complaints of SOB that started approx morning of 9/28, pt is from [place] and per EMS staff c/o decreased LOC for pt.  Audible wheeze heard, espicially left side, pt bent over and son, at bedside, reports this as normal.  Pt has hx of "respiratory abnormalities", AFIB.

## 2018-07-03 NOTE — Progress Notes (Signed)
ANTICOAGULATION CONSULT NOTE - Initial Consult  Pharmacy Consult for heparin Indication: chest pain/ACS  Allergies  Allergen Reactions  . Ceclor [Cefaclor]     Passed out     Patient Measurements: Height: 5' (152.4 cm) Weight: 92 lb (41.7 kg) IBW/kg (Calculated) : 45.5 Heparin Dosing Weight: 41.7 kg  Vital Signs: Temp: 98.6 F (37 C) (09/29 0312) Temp Source: Oral (09/29 0312) BP: 154/61 (09/29 0500) Pulse Rate: 74 (09/29 0500)  Labs: Recent Labs    07/03/18 0319  HGB 11.0*  HCT 31.2*  PLT 245  CREATININE 0.77  TROPONINI 3.03*    Estimated Creatinine Clearance: 27.1 mL/min (by C-G formula based on SCr of 0.77 mg/dL).   Medical History: Past Medical History:  Diagnosis Date  . A-fib (HCC)   . Allergic rhinitis   . Constipation   . Dyspepsia   . Esophageal reflux   . Generalized weakness   . Glaucoma   . Hypertension   . Osteoporosis   . Pulmonary fibrosis (HCC)    There is no evidence of pulmonary fibrosis on CT chest    Medications:  Scheduled:  . aspirin  81 mg Oral Daily  . aspirin EC  325 mg Oral Once  . ceFEPime (MAXIPIME) IV  500 mg Intravenous Q12H  . heparin  2,500 Units Intravenous Once    Assessment: Patient admitted for SOB w/ trops 3.03, EKG showing T-wave inversion No PTA anticoagulation Patient being started on heparin drip for possible ACS/UA  Goal of Therapy:  Heparin level 0.3-0.7 units/ml Monitor platelets by anticoagulation protocol: Yes   Plan:  Will bolus heparin 2500 units IV x 1 Will start heparin drip @ 600 units/hr Baseline labs drawn Will check anti-Xa level at 1400 Will monitor daily CBC's and adjust per anti-Xa levels  Thomasene Ripple, PharmD, BCPS Clinical Pharmacist 07/03/2018

## 2018-07-03 NOTE — Progress Notes (Signed)
Kelly Booth  is a 84 y.o. female with a known history of COPD, GERD, Afib (no AC, Hx GIB 06/2016), SSS (no PPM) p/w tachypnea/SOB, AMS.  She is very lethargic, responsive to stimuli, on oxygen by nasal cannula 2 L, on heparin drip. Vital signs and labs reviewed. Continue current treatment.  Follow-up CBC, BMP and cultures.  I discussed with RN and her son.  Time spent about 25 minutes.

## 2018-07-03 NOTE — ED Notes (Signed)
Patient transported to CT 

## 2018-07-03 NOTE — Progress Notes (Signed)
CODE SEPSIS - PHARMACY COMMUNICATION  **Broad Spectrum Antibiotics should be administered within 1 hour of Sepsis diagnosis**  Time Code Sepsis Called/Page Received: 07:57  Antibiotics Ordered: cefepime/vanc  Time of 1st antibiotic administration: 04:50  Additional action taken by pharmacy:   If necessary, Name of Provider/Nurse Contacted:     Carola Frost ,PharmD Clinical Pharmacist  07/03/2018  8:01 AM

## 2018-07-03 NOTE — H&P (Signed)
[facility] [place] [facility]   PATIENT NAME: Kelly Booth    MR#:  [id number]  DATE OF BIRTH:  1920-09-20  DATE OF ADMISSION:  07/03/2018  PRIMARY CARE PHYSICIAN: Raynelle Bring   REQUESTING/REFERRING PHYSICIAN: Loleta Rose, MD  CHIEF COMPLAINT:   Chief Complaint  Patient presents with  . Shortness of Breath    HISTORY OF PRESENT ILLNESS:  Kelly Booth  is a 84 y.o. female with a known history of COPD, GERD, Afib (no AC, Hx GIB 06/2016), SSS (no PPM) p/w tachypnea/SOB, AMS. She is sleeping soundly at the time of my assessment. Her son is at bedside, and provides Hx. He states pt resides at [facility]. He states he visits her twice per day on weekdays, and once per day on Saturday and Sunday. He had seen pt @~1100AM on Saturday 07/02/2018, and had noticed she appeared generally unwell. She was tachypneic and not as responsive as usual. He states that she has not been thinking clearly for approximately one week. He states she was not diaphoretic at the time. Vitals demonstrated normal BP, HR in 90s, T 99.16F and SpO2 91% on RA. He states he went back to visit the pt @~1630PM, but she was largely unchanged. There was some suspicion of urinary tract infection, as she has apparently had similar changes in mentation w/ UTI in the past. Pt's son received a call from the facility @~0200AM on Sunday 07/03/2018 stating pt had AMS, and was to be transferred to the hospital. U/A (+) UTI, tachypnea + leukocytosis (WBC 20.4), SIRS (+), (+) sepsis. Lactate 0.8, BP stable. Troponin-I 3.03, BNP 1264, NSTEMI/ACS vs. PE. Pt is DNR/DNI. Pt's son is realistic with regards to pt's prognosis.  PAST MEDICAL HISTORY:   Past Medical History:  Diagnosis Date  . A-fib (HCC)   . Allergic rhinitis   . Constipation   . Dyspepsia   . Esophageal reflux   . Generalized weakness   . Glaucoma   . Hypertension   . Osteoporosis   . Pulmonary fibrosis (HCC)    There is no  evidence of pulmonary fibrosis on CT chest    PAST SURGICAL HISTORY:   Past Surgical History:  Procedure Laterality Date  . ABDOMINAL HYSTERECTOMY    . TOTAL HIP ARTHROPLASTY      SOCIAL HISTORY:   Social History   Tobacco Use  . Smoking status: Never Smoker  . Smokeless tobacco: Never Used  Substance Use Topics  . Alcohol use: No    Alcohol/week: 0.0 standard drinks    FAMILY HISTORY:   Family History  Problem Relation Age of Onset  . Hypertension Other     DRUG ALLERGIES:   Allergies  Allergen Reactions  . Ceclor [Cefaclor]     Passed out     REVIEW OF SYSTEMS:   Review of Systems  Unable to perform ROS: Patient nonverbal  Respiratory: Positive for shortness of breath.    MEDICATIONS AT HOME:   Prior to Admission medications   Medication Sig Start Date End Date Taking? Authorizing Provider  acetaminophen (TYLENOL) 160 MG/5ML elixir Take 15 mg/kg by mouth every 4 (four) hours as needed for fever.   Yes [provider]  albuterol (PROVENTIL) (2.5 MG/3ML) 0.083% nebulizer solution Take 3 mLs (2.5 mg total) by nebulization every 6 (six) hours as needed for wheezing or shortness of breath. 10/15/17  Yes Enedina Finner, MD  bisacodyl (STIMULANT LAXATIVE) 5 MG EC tablet Take 5 mg by mouth 2 (two)  times daily.  09/18/15  Yes [provider]  Calcium Carbonate-Vitamin D (CALCIUM-VITAMIN D) 500-200 MG-UNIT tablet Take 1 tablet by mouth 2 (two) times daily. 09/18/15  Yes [provider]  Cholecalciferol (VITAMIN D3) 1000 units CAPS Take 1 capsule by mouth daily. 09/18/15  Yes [provider]  ferrous sulfate 325 (65 FE) MG tablet Take 1 tablet by mouth daily. 09/18/15  Yes [provider]  furosemide (LASIX) 40 MG tablet Take 40 mg by mouth daily.  09/18/15  Yes [provider]  Ipratropium-Albuterol (COMBIVENT) 20-100 MCG/ACT AERS respimat Inhale 1 puff into the lungs 4 (four) times daily. 09/18/15  Yes [provider]  latanoprost (XALATAN) 0.005 % ophthalmic solution Apply 1 drop to eye at bedtime. 09/18/15  Yes [provider]  metoprolol tartrate (LOPRESSOR) 25 MG tablet Take 0.5 tablets (12.5 mg total) by mouth 2 (two) times daily. 06/11/16  Yes Katharina Caper, MD  omeprazole (PRILOSEC) 20 MG capsule Take 20 mg by mouth 2 (two) times daily before a meal.   Yes [provider]  pantoprazole (PROTONIX) 40 MG tablet Take 1 tablet (40 mg total) by mouth 2 (two) times daily before a meal. 06/11/16  Yes Katharina Caper, MD  polyvinyl alcohol (ARTIFICIAL TEARS) 1.4 % ophthalmic solution Apply 1 drop to eye 4 (four) times daily as needed. 09/18/15  Yes [provider]  potassium chloride (KLOR-CON) 20 MEQ packet Take 20 mEq by mouth daily.   Yes [provider]  ranitidine (ZANTAC) 150 MG tablet Take 1 tablet by mouth at bedtime.  09/18/15  Yes [provider]      VITAL SIGNS:  Blood pressure (!) 159/63, pulse 90, temperature 98.6 F (37 C), temperature source Oral, resp. rate 18, height 5' (1.524 m), weight 41.7 kg, SpO2 98 %.  PHYSICAL EXAMINATION:  Physical Exam  Constitutional: She appears well-developed. She is sleeping. She is easily aroused. She has a sickly appearance. She appears ill. No distress. She is not intubated.  HENT:  Head: Normocephalic and atraumatic.  Eyes: Conjunctivae, EOM and lids are normal. No scleral icterus.  Neck: Neck supple. No JVD present. No thyromegaly present.  Cardiovascular: Normal rate, regular rhythm, S1 normal, S2 normal and normal heart sounds.  No extrasystoles are present. Exam reveals no gallop, no S3, no S4, no distant heart sounds and no friction rub.  No murmur heard. Pulmonary/Chest: Effort normal. No accessory muscle usage or stridor. Tachypnea noted. No apnea and no bradypnea. She is not intubated. No respiratory distress. She has decreased breath sounds in the right upper field, the right middle field,  the right lower field, the left upper field, the left middle field and the left lower field. She has no wheezes. She has rhonchi in the right lower field and the left lower field. She has no rales.  Abdominal: Soft. Bowel sounds are normal. She exhibits no distension. There is no tenderness. There is no rigidity, no rebound and no guarding.  Musculoskeletal: She exhibits no edema or tenderness.       Right lower leg: Normal. She exhibits no tenderness and no edema.       Left lower leg: Normal. She exhibits no tenderness and no edema.  Lymphadenopathy:    She has no cervical adenopathy.  Neurological: She is easily aroused.  Sleeping. Arousable.  Skin: Skin is warm and dry. She is not diaphoretic. No erythema. No pallor.  Psychiatric: Her behavior is normal.  Sleeping. Arousable.   Diffusely diminished w/  bibasilar rales. LABORATORY PANEL:   CBC Recent Labs  Lab 07/03/18 0319  WBC 20.4*  HGB 11.0*  HCT 31.2*  PLT 245   ------------------------------------------------------------------------------------------------------------------  Chemistries  Recent Labs  Lab 07/03/18 0319  NA 139  K 3.9  CL 97*  CO2 31  GLUCOSE 103*  BUN 24*  CREATININE 0.77  CALCIUM 9.1  MG 1.8  AST 27  ALT 9  ALKPHOS 69  BILITOT 1.2   ------------------------------------------------------------------------------------------------------------------  Cardiac Enzymes Recent Labs  Lab 07/03/18 0319  TROPONINI 3.03*   ------------------------------------------------------------------------------------------------------------------  RADIOLOGY:  Dg Chest Portable 1 View  Result Date: 07/03/2018 CLINICAL DATA:  84 year old female with respiratory difficulty. EXAM: PORTABLE CHEST 1 VIEW COMPARISON:  Chest radiograph dated 12/24/2017 FINDINGS: The patient is rotated and tilted. There is emphysematous changes of the lungs with hyperexpansion and flattening of the diaphragms. Small bilateral  pleural effusions may be present. No pneumothorax. No lobar consolidative changes. Stable cardiac silhouette. Atherosclerotic calcification of the aorta. Osteopenia. No acute osseous pathology. IMPRESSION: No focal consolidation.  No significant interval change. Electronically Signed   By: Elgie Collard M.D.   On: 07/03/2018 03:42   IMPRESSION AND PLAN:   A/P: 24F sepsis, UTI, possible pneumonia. Troponin-I elevation, suspect ACS/NSTEMI. BNP elevation. Hypochloremia, hyperglycemia, hypoalbuminemia, leukocytosis, normocytic anemia. -Sepsis, UTI, possible pneumonia: Tachypnea, leukocytosis, SIRS (+). U/A (+) UTI. Sepsis 2/2 UTI. Lactate 0.8. (-) hypotension. CTA chest (-) definite acute PE, (+) small B/L effusion + partial consolidation, mucus plugging. Possible pneumonia. No recent hospitalizations, but resides at a facility. Given acuity/severity of presentation, initiated broad-spectrum ABx (Cefepime + Vancomycin) in ED. PCT 0.21. BCx, UCx, Sputum Cx/gram, UStrep + ULegionella Ag pending. -Troponin elevation, suspected ACS/NSTEMI: Pt follows w/ Dr. Darrold Junker as outpt. Hx Afib (no AC), SSS (no PPM), significant valvulopathy (including moderate/severe AS) on 12/15/2016 Echo. Trop-I 3.03 on present admission. EKG (-) STEMI. I interpreted the EKG as (+) S1-Q3-T3, however CTA chest does not demonstrate definite acute PE. Likely cause ACS/NSTEMI vs. demand. Heparin gtt. ASA, statin, c/w beta blocker. Echo pending. Cardiac monitoring. -BNP elevation: BNP 1264. EF > 55% as of 12/15/2016 Echo, though the study did not significant valvulopathy. Elevation likely 2/2 demand in the setting of sepsis + ACS/NSTEMI, as above. Pt not clinically in failure. Labwork suggests intravascular volume depletion. Received IVF in ED 2/2 sepsis, monitor for flash pulmonary edema. Pulse oximetry. Echo pending. -Hyperglycemia: Hx DM per prior outpt documentation. Monitor FSG. -Hypoalbuminemia: Prealbumin. -Normocytic anemia: Likely  2/2 anemia of chronic disease. No evidence of acute blood loss at present time. -c/w other home meds. -FEN/GI: NPO, ADAT (regular diet). -DVT PPx: Heparin gtt. -Code status: DNR/DNI. -Disposition: Admission, > 2 midnights. High risk of mortality, family aware.   All the records are reviewed and case discussed with ED provider. Management plans discussed with the patient, family and they are in agreement.  CODE STATUS: DNR/DNI.  TOTAL TIME TAKING CARE OF THIS PATIENT: 90 minutes.    Barbaraann Rondo M.D on 07/03/2018 at 6:57 AM  Between 7am to 6pm - Pager - [phone]  After 6pm go to [email or web address] - Social research officer, government  [facility] [place] [facility]  Office  [phone]  CC: Primary care physician; Raynelle Bring   Note: This dictation was prepared with Dragon dictation along with smaller phrase technology. Any transcriptional errors that result from this process are unintentional.

## 2018-07-04 DIAGNOSIS — E43 Unspecified severe protein-calorie malnutrition: Secondary | ICD-10-CM

## 2018-07-04 DIAGNOSIS — J9621 Acute and chronic respiratory failure with hypoxia: Secondary | ICD-10-CM

## 2018-07-04 LAB — CBC
HEMATOCRIT: 31.6 % — AB (ref 35.0–47.0)
HEMOGLOBIN: 10.9 g/dL — AB (ref 12.0–16.0)
MCH: 34.3 pg — AB (ref 26.0–34.0)
MCHC: 34.4 g/dL (ref 32.0–36.0)
MCV: 99.9 fL (ref 80.0–100.0)
Platelets: 202 10*3/uL (ref 150–440)
RBC: 3.17 MIL/uL — ABNORMAL LOW (ref 3.80–5.20)
RDW: 13.3 % (ref 11.5–14.5)
WBC: 15.9 10*3/uL — ABNORMAL HIGH (ref 3.6–11.0)

## 2018-07-04 LAB — HEPARIN LEVEL (UNFRACTIONATED)
HEPARIN UNFRACTIONATED: 0.38 [IU]/mL (ref 0.30–0.70)
Heparin Unfractionated: 0.41 IU/mL (ref 0.30–0.70)

## 2018-07-04 LAB — BASIC METABOLIC PANEL
ANION GAP: 8 (ref 5–15)
BUN: 19 mg/dL (ref 8–23)
CHLORIDE: 102 mmol/L (ref 98–111)
CO2: 30 mmol/L (ref 22–32)
Calcium: 8.6 mg/dL — ABNORMAL LOW (ref 8.9–10.3)
Creatinine, Ser: 0.82 mg/dL (ref 0.44–1.00)
GFR calc non Af Amer: 59 mL/min — ABNORMAL LOW (ref >60)
GLUCOSE: 120 mg/dL — AB (ref 70–99)
POTASSIUM: 3.1 mmol/L — AB (ref 3.5–5.1)
Sodium: 140 mmol/L (ref 135–145)

## 2018-07-04 LAB — POTASSIUM: POTASSIUM: 4.1 mmol/L (ref 3.5–5.1)

## 2018-07-04 LAB — PROCALCITONIN: Procalcitonin: 0.24 ng/mL

## 2018-07-04 LAB — GLUCOSE, CAPILLARY: GLUCOSE-CAPILLARY: 108 mg/dL — AB (ref 70–99)

## 2018-07-04 LAB — TROPONIN I: Troponin I: 0.94 ng/mL (ref <0.03)

## 2018-07-04 MED ORDER — FAMOTIDINE 20 MG PO TABS
20.0000 mg | ORAL_TABLET | Freq: Every day | ORAL | Status: DC
  Administered 2018-07-05 – 2018-07-07 (×3): 20 mg via ORAL
  Filled 2018-07-04 (×3): qty 1

## 2018-07-04 MED ORDER — ENSURE ENLIVE PO LIQD
237.0000 mL | Freq: Three times a day (TID) | ORAL | Status: DC
  Administered 2018-07-04 – 2018-07-07 (×5): 237 mL via ORAL

## 2018-07-04 MED ORDER — AZITHROMYCIN 500 MG PO TABS
500.0000 mg | ORAL_TABLET | Freq: Once | ORAL | Status: AC
  Administered 2018-07-04: 500 mg via ORAL
  Filled 2018-07-04: qty 1

## 2018-07-04 MED ORDER — POTASSIUM CHLORIDE 20 MEQ PO PACK
60.0000 meq | PACK | Freq: Once | ORAL | Status: AC
  Administered 2018-07-04: 60 meq via ORAL
  Filled 2018-07-04: qty 3

## 2018-07-04 MED ORDER — AZITHROMYCIN 500 MG PO TABS
250.0000 mg | ORAL_TABLET | Freq: Every day | ORAL | Status: DC
  Administered 2018-07-06: 17:00:00 250 mg via ORAL
  Filled 2018-07-04 (×2): qty 1

## 2018-07-04 MED ORDER — SODIUM CHLORIDE 0.9 % IV SOLN
1.0000 g | INTRAVENOUS | Status: DC
  Administered 2018-07-04: 1 g via INTRAVENOUS
  Filled 2018-07-04: qty 10
  Filled 2018-07-04: qty 1

## 2018-07-04 MED ORDER — POTASSIUM CHLORIDE 10 MEQ/100ML IV SOLN
10.0000 meq | INTRAVENOUS | Status: AC
  Administered 2018-07-04 (×2): 10 meq via INTRAVENOUS
  Filled 2018-07-04 (×2): qty 100

## 2018-07-04 MED ORDER — ADULT MULTIVITAMIN W/MINERALS CH
1.0000 | ORAL_TABLET | Freq: Every day | ORAL | Status: DC
  Administered 2018-07-05 – 2018-07-07 (×3): 1 via ORAL
  Filled 2018-07-04 (×3): qty 1

## 2018-07-04 MED ORDER — MAGNESIUM SULFATE IN D5W 1-5 GM/100ML-% IV SOLN
1.0000 g | Freq: Once | INTRAVENOUS | Status: AC
  Administered 2018-07-04: 1 g via INTRAVENOUS
  Filled 2018-07-04: qty 100

## 2018-07-04 NOTE — Progress Notes (Signed)
Initial Nutrition Assessment  DOCUMENTATION CODES:   Severe malnutrition in context of chronic illness, Underweight  INTERVENTION:  Agree with regular diet.  Provide Ensure Enlive po TID, each supplement provides 350 kcal and 20 grams of protein.  Provide daily MVI.  Monitor magnesium, potassium, and phosphorus daily for at least 3 days, MD to replete as needed, as pt is at risk for refeeding syndrome given severe malnutrition.  NUTRITION DIAGNOSIS:   Severe Malnutrition related to chronic illness(pulmonary fibrosis, COPD, dementia) as evidenced by severe fat depletion, severe muscle depletion, 12 percent weight loss over the past 6 months.  GOAL:   Patient will meet greater than or equal to 90% of their needs   MONITOR:   PO intake, Supplement acceptance, Labs, Weight trends, I & O's  REASON FOR ASSESSMENT:   Other (Comment)(Low BMI)    ASSESSMENT:   84 year old female with PMHx of A-fib, glaucoma, HTN, pulmonary fibrosis, esophageal reflux, constipation, OP, dementia, COPD admitted with AMS, possible PNA, pyuria, suspected sepsis.   -Pending PMT consult to discuss goals of care.  Met with patient at bedside as she was eating her breakfast. She is not a reliable historian and there are no family members available at time of RD assessment. Patient denies any issues with chewing and reports she enjoys Ensure. She is unable to report her nutrition history or weight history. Per review of weight history in chart patient was 41.6 kg on 12/24/2017 and is currently 36.6 kg (80.7 lbs). She has lost 5 kg (12% body weight) over the past 6 months, which is significant for time frame.  Meal Completion: 0-20%  Medications reviewed and include: cefepime, D5-LR at 35 mL/hr, famotidine, heparin gtt.  Labs reviewed: CBG 87, Potassium 3.1.  Discussed with RN and on rounds.  NUTRITION - FOCUSED PHYSICAL EXAM:    Most Recent Value  Orbital Region  Severe depletion  Upper Arm Region   Severe depletion  Thoracic and Lumbar Region  Severe depletion  Buccal Region  Severe depletion  Temple Region  Severe depletion  Clavicle Bone Region  Severe depletion  Clavicle and Acromion Bone Region  Severe depletion  Scapular Bone Region  Severe depletion  Dorsal Hand  Severe depletion  Patellar Region  Severe depletion  Anterior Thigh Region  Severe depletion  Posterior Calf Region  Severe depletion  Edema (RD Assessment)  None  Hair  Reviewed  Eyes  Reviewed  Mouth  Unable to assess  Skin  Reviewed  Nails  Reviewed     Diet Order:   Diet Order            Diet regular Room service appropriate? Yes; Fluid consistency: Thin  Diet effective now              EDUCATION NEEDS:   Not appropriate for education at this time  Skin:  Skin Assessment: Reviewed RN Assessment  Last BM:  Unknown/PTA  Height:   Ht Readings from Last 1 Encounters:  07/04/18 5' (1.524 m)    Weight:   Wt Readings from Last 1 Encounters:  07/04/18 36.6 kg    Ideal Body Weight:  45.5 kg  BMI:  Body mass index is 15.76 kg/m.  Estimated Nutritional Needs:   Kcal:  1200-1400  Protein:  60-70 grams  Fluid:  1.2-1.4 L/day  Willey Blade, MS, RD, LDN Office: [phone] Pager: [phone] After Hours/Weekend Pager: [phone]

## 2018-07-04 NOTE — Progress Notes (Signed)
ANTICOAGULATION CONSULT NOTE - Initial Consult  Pharmacy Consult for heparin Indication: chest pain/ACS  Allergies  Allergen Reactions  . Ceclor [Cefaclor]     Passed out     Patient Measurements: Height: 5' (152.4 cm) Weight: 79 lb 12.9 oz (36.2 kg) IBW/kg (Calculated) : 45.5 Heparin Dosing Weight: 36.2 kg  Vital Signs: Temp: 97.8 F (36.6 C) (09/30 0800) Temp Source: Oral (09/30 0800) BP: 138/64 (09/30 1000) Pulse Rate: 70 (09/30 1100)  Labs: Recent Labs    07/03/18 0319 07/03/18 0602 07/03/18 1416 07/04/18 0109 07/04/18 0918  HGB 11.0*  --   --  10.9*  --   HCT 31.2*  --   --  31.6*  --   PLT 245  --   --  202  --   APTT  --  35  --   --   --   LABPROT  --  14.1  --   --   --   INR  --  1.10  --   --   --   HEPARINUNFRC  --   --  0.29* 0.38 0.41  CREATININE 0.77  --   --  0.82  --   TROPONINI 3.03*  --   --   --   --     Estimated Creatinine Clearance: 22.9 mL/min (by C-G formula based on SCr of 0.82 mg/dL).   Medical History: Past Medical History:  Diagnosis Date  . A-fib (HCC)   . Allergic rhinitis   . Constipation   . COPD (chronic obstructive pulmonary disease) (HCC)   . Dementia   . Dyspepsia   . Esophageal reflux   . Generalized weakness   . Glaucoma   . Hypertension   . Osteoporosis   . Pulmonary fibrosis (HCC)    There is no evidence of pulmonary fibrosis on CT chest    Medications:  Scheduled:  . aspirin  81 mg Oral Daily  . Chlorhexidine Gluconate Cloth  6 each Topical Q0600  . metoprolol tartrate  12.5 mg Oral BID  . mupirocin ointment  1 application Nasal BID    Assessment: Patient admitted for SOB w/ trops 3.03, EKG showing T-wave inversion. No PTA anticoagulation. Pharmacy consulted for heparin.  Goal of Therapy:  Heparin level 0.3-0.7 units/ml Monitor platelets by anticoagulation protocol: Yes   Plan:  9/30 0918 HL 0.41. Heparin level therapeutic x 2. Will continue heparin drip at 700 units/hr. Heparin level and CBC  ordered with morning labs.  Pricilla Riffle, PharmD Pharmacy Resident  07/04/2018 11:35 AM

## 2018-07-04 NOTE — Progress Notes (Signed)
Pharmacy Electrolyte Monitoring Consult:  Pharmacy consulted to assist in monitoring and replacing electrolytes in this 83 y.o. female admitted on 07/03/2018 with Shortness of Breath   Labs:  Sodium (mmol/L)  Date Value  07/04/2018 140  08/21/2014 138   Potassium (mmol/L)  Date Value  07/04/2018 4.1  08/21/2014 3.8   Magnesium (mg/dL)  Date Value  16/07/9603 1.8  06/26/2013 2.0   Phosphorus (mg/dL)  Date Value  54/06/8118 3.2  10/18/2013 2.8   Calcium (mg/dL)  Date Value  14/78/2956 8.6 (L)   Calcium, Total (mg/dL)  Date Value  21/30/8657 9.7   Albumin (g/dL)  Date Value  84/69/6295 3.0 (L)  08/21/2014 3.7    Assessment/Plan: Patient received potassium chloride PO x 1 at 0315 and potassium IV Q1hr x 2 doses at 0630.   In setting of elevated troponins will order magnesium 1g IV x 1.   Goal potassium ~ 4 and goal magnesium ~ 2.   Will recheck potassium at 1800 and follow along with ordered electrolytes with am labs on 10/1.    9/30 @ 1745 K+ resulted at 4.1.  Will monitor labs with AM draw.  Pharmacy will continue to monitor and adjust per consult.   Orinda Kenner, PharmD 07/04/2018 7:57 PM

## 2018-07-04 NOTE — Progress Notes (Signed)
Report given to 1C RN. Patient taken off tele monitoring. CCMD notified. Patient transferred to 1C vis bed. Son called and updated.

## 2018-07-04 NOTE — Progress Notes (Signed)
Pharmacy Antibiotic Note  Kelly Booth is a 84 y.o. female admitted on 07/03/2018 with acute on chronic respiratory failure with hypoxemia. Questionable pneumonia vs. UTI. Pharmacy has been consulted for vancomycin and cefepime dosing.  Plan: Per CCM on ICU rounds 9/30 am, will discontinue vancomycin. MRSA PCR positive; however, urine culture with Klebsiella pneumoniae. With positive source of infection (urine), will narrow to ceftriaxone with next scheduled dose.   Continue ceftriaxone 1 g IV q24h  Height: 5' (152.4 cm) Weight: 80 lb 11.2 oz (36.6 kg) IBW/kg (Calculated) : 45.5  Temp (24hrs), Avg:98.3 F (36.8 C), Min:97.8 F (36.6 C), Max:99.1 F (37.3 C)  Recent Labs  Lab 07/03/18 0319 07/03/18 0408 07/04/18 0109  WBC 20.4*  --  15.9*  CREATININE 0.77  --  0.82  LATICACIDVEN  --  0.8  --     Estimated Creatinine Clearance: 23.2 mL/min (by C-G formula based on SCr of 0.82 mg/dL).    Allergies  Allergen Reactions  . Ceclor [Cefaclor]     Passed out     Antimicrobials this admission: Vancomycin 9/29 x 1 dose Cefepime 9/29 >> 9/30  Ceftriaxone 9/20 >>   Dose adjustments this admission: N/A  Microbiology results: 9/29 BCx: NG 12 hours 9/29 UCx: Klebsiella pneumoniae (susceptibilities pending)  9/29 MRSA PCR: positive  Thank you for allowing pharmacy to be a part of this patient's care.  Zyaire Dumas L, 07/04/2018 2:58 PM

## 2018-07-04 NOTE — Progress Notes (Signed)
ANTICOAGULATION CONSULT NOTE - Initial Consult  Pharmacy Consult for heparin Indication: chest pain/ACS  Allergies  Allergen Reactions  . Ceclor [Cefaclor]     Passed out     Patient Measurements: Height: 5' (152.4 cm) Weight: 79 lb 12.9 oz (36.2 kg) IBW/kg (Calculated) : 45.5 Heparin Dosing Weight: 41.7 kg  Vital Signs: Temp: 99.1 F (37.3 C) (09/29 1955) Temp Source: Axillary (09/29 1955) BP: 139/50 (09/30 0200) Pulse Rate: 69 (09/30 0200)  Labs: Recent Labs    07/03/18 0319 07/03/18 0602 07/03/18 1416 07/04/18 0109  HGB 11.0*  --   --  10.9*  HCT 31.2*  --   --  31.6*  PLT 245  --   --  202  APTT  --  35  --   --   LABPROT  --  14.1  --   --   INR  --  1.10  --   --   HEPARINUNFRC  --   --  0.29* 0.38  CREATININE 0.77  --   --  0.82  TROPONINI 3.03*  --   --   --     Estimated Creatinine Clearance: 22.9 mL/min (by C-G formula based on SCr of 0.82 mg/dL).   Medical History: Past Medical History:  Diagnosis Date  . A-fib (HCC)   . Allergic rhinitis   . Constipation   . COPD (chronic obstructive pulmonary disease) (HCC)   . Dementia   . Dyspepsia   . Esophageal reflux   . Generalized weakness   . Glaucoma   . Hypertension   . Osteoporosis   . Pulmonary fibrosis (HCC)    There is no evidence of pulmonary fibrosis on CT chest    Medications:  Scheduled:  . aspirin  81 mg Oral Daily  . Chlorhexidine Gluconate Cloth  6 each Topical Q0600  . metoprolol tartrate  12.5 mg Oral BID  . mupirocin ointment  1 application Nasal BID  . potassium chloride  60 mEq Oral Once    Assessment: Patient admitted for SOB w/ trops 3.03, EKG showing T-wave inversion No PTA anticoagulation Patient being started on heparin drip for possible ACS/UA  Goal of Therapy:  Heparin level 0.3-0.7 units/ml Monitor platelets by anticoagulation protocol: Yes   Plan:  09/30 @ 0100 HL 0.38 therapeutic. Will continue rate at 700 units/hr and will recheck HL @ 0900. hgb stable,  will continue to monitor.  Thomasene Ripple, PharmD, BCPS Clinical Pharmacist 07/04/2018

## 2018-07-04 NOTE — Progress Notes (Signed)
Pharmacy Electrolyte Monitoring Consult:  Pharmacy consulted to assist in monitoring and replacing electrolytes in this 84 y.o. female admitted on 07/03/2018 with Shortness of Breath   Labs:  Sodium (mmol/L)  Date Value  07/04/2018 140  08/21/2014 138   Potassium (mmol/L)  Date Value  07/04/2018 3.1 (L)  08/21/2014 3.8   Magnesium (mg/dL)  Date Value  16/07/9603 1.8  06/26/2013 2.0   Phosphorus (mg/dL)  Date Value  54/06/8118 3.2  10/18/2013 2.8   Calcium (mg/dL)  Date Value  14/78/2956 8.6 (L)   Calcium, Total (mg/dL)  Date Value  21/30/8657 9.7   Albumin (g/dL)  Date Value  84/69/6295 3.0 (L)  08/21/2014 3.7    Assessment/Plan: Patient received potassium chloride PO x 1 at 0315 and potassium IV Q1hr x 2 doses at 0630.   In setting of elevated troponins will order magnesium 1g IV x 1.   Goal potassium ~ 4 and goal magnesium ~ 2.   Will recheck potassium at 1800 and follow along with ordered electrolytes with am labs on 10/1.    Pharmacy will continue to monitor and adjust per consult.   Howell Groesbeck L 07/04/2018 2:51 PM

## 2018-07-04 NOTE — Progress Notes (Signed)
Pharmacy Antibiotic Note  Kelly Booth is a 84 y.o. female admitted on 07/03/2018 with acute on chronic respiratory failure with hypoxemia. Questionable pneumonia vs. UTI. Pharmacy has been consulted for vancomycin and cefepime dosing.  Plan: Per CCM on ICU rounds 9/30 am, will discontinue vancomycin. MRSA PCR positive; however, urine culture with Klebsiella pneumoniae. With positive source of infection (urine), will continue with cefepime for now  Continue cefepime 1 g IV q24h  Height: 5' (152.4 cm) Weight: 79 lb 12.9 oz (36.2 kg) IBW/kg (Calculated) : 45.5  Temp (24hrs), Avg:98.3 F (36.8 C), Min:97.8 F (36.6 C), Max:99.1 F (37.3 C)  Recent Labs  Lab 07/03/18 0319 07/03/18 0408 07/04/18 0109  WBC 20.4*  --  15.9*  CREATININE 0.77  --  0.82  LATICACIDVEN  --  0.8  --     Estimated Creatinine Clearance: 22.9 mL/min (by C-G formula based on SCr of 0.82 mg/dL).    Allergies  Allergen Reactions  . Ceclor [Cefaclor]     Passed out     Antimicrobials this admission: Vancomycin 9/29 x 1 dose Cefepime 9/29 >>  Dose adjustments this admission: N/A  Microbiology results: 9/29 BCx: NG 12 hours 9/29 UCx: Klebsiella pneumoniae (susceptibilities pending)  9/29 MRSA PCR: positive  Thank you for allowing pharmacy to be a part of this patient's care.  Pricilla Riffle, PharmD Pharmacy Resident  07/04/2018 11:46 AM

## 2018-07-04 NOTE — Progress Notes (Addendum)
[facility] - [place] at [facility]   PATIENT NAME: Kelly Booth    MR#:  [id number]  DATE OF BIRTH:  06-08-1920  SUBJECTIVE:  Patient states she is doing fine this morning. Her shortness of breath has improved. Her mouth feels dry. She is having a mild cough. No other concerns.  REVIEW OF SYSTEMS:  Review of Systems  Constitutional: Negative for chills and fever.  HENT: Negative for congestion and sore throat.   Eyes: Negative for blurred vision and double vision.  Respiratory: Positive for cough. Negative for shortness of breath.   Cardiovascular: Negative for chest pain, palpitations and leg swelling.  Gastrointestinal: Negative for abdominal pain, nausea and vomiting.  Genitourinary: Negative for dysuria and frequency.  Musculoskeletal: Negative for back pain and neck pain.  Neurological: Negative for dizziness and headaches.  Psychiatric/Behavioral: Negative for depression. The patient is not nervous/anxious.     DRUG ALLERGIES:   Allergies  Allergen Reactions  . Ceclor [Cefaclor]     Passed out    VITALS:  Blood pressure 125/62, pulse 69, temperature 97.9 F (36.6 C), temperature source Oral, resp. rate 18, height 5' (1.524 m), weight 36.6 kg, SpO2 93 %. PHYSICAL EXAMINATION:  Physical Exam  Constitutional: She is oriented to person, place, and time.  Thin-appearing  HENT:  Head: Normocephalic and atraumatic.  Mildly dry mucous membranes  Eyes: Pupils are equal, round, and reactive to light. EOM are normal.  Neck: Normal range of motion. Neck supple.  Cardiovascular: Normal rate and regular rhythm.  +loud, high-pitched systolic murmur  Pulmonary/Chest: Effort normal and breath sounds normal. No respiratory distress.  Abdominal: Soft. Bowel sounds are normal. She exhibits no distension. There is no tenderness.  Musculoskeletal: Normal range of motion.       Right lower leg: She exhibits no edema.       Left lower leg: She exhibits no edema.    Neurological: She is alert and oriented to person, place, and time. No cranial nerve deficit.  Skin: Skin is warm and dry. Capillary refill takes less than 2 seconds. No rash noted.  Psychiatric: She has a normal mood and affect. Her behavior is normal.   LABORATORY PANEL:  Female CBC Recent Labs  Lab 07/04/18 0109  WBC 15.9*  HGB 10.9*  HCT 31.6*  PLT 202   ------------------------------------------------------------------------------------------------------------------ Chemistries  Recent Labs  Lab 07/03/18 0319 07/04/18 0109  NA 139 140  K 3.9 3.1*  CL 97* 102  CO2 31 30  GLUCOSE 103* 120*  BUN 24* 19  CREATININE 0.77 0.82  CALCIUM 9.1 8.6*  MG 1.8  --   AST 27  --   ALT 9  --   ALKPHOS 69  --   BILITOT 1.2  --    RADIOLOGY:  No results found. ASSESSMENT AND PLAN:   Acute on chronic hypoxic respiratory failure- secondary to bilateral lower lobe pneumonia (CAP vs aspiration). Initially using BiPAP, but now on room air. Leukocytosis improving. - has been on ceftriaxone, will add azithromycin for coverage of CAP - blood cultures with no growth so far - SLP eval for swallowing function due to concern for aspiration pneumonia on CTA chest - palliative care consult  Klebsiella UTI- urine cultures growing >100,000 CFU Klebsiella - f/u urine culture susceptibilities - continue ceftriaxone  Elevated troponin- likely due to demand ischemia, no active chest pain - trend troponins - continue heparin drip for now - not a candidate for aggressive intervention  Severe aortic stenosis- stable -  palliative care consult  Paroxysmal atrial fibrillation- in NSR - continue metoprolol   Osteoporosis- CTA chest showed multiple compression fractures of the thoracic spine, age indeterminate. Not having acute back pain  Severe protein-calorie malnutrition - nutrition consult  Normocytic anemia- hgb at baseline - monitor  Plan for discharge back to SNF in 1-2 days  All  the records are reviewed and case discussed with Care Management/Social Worker. Management plans discussed with the patient, family and they are in agreement.  CODE STATUS: DNR  TOTAL TIME TAKING CARE OF THIS PATIENT: 40 minutes.   More than 50% of the time was spent in counseling/coordination of care: YES  POSSIBLE D/C IN 1-2 DAYS, DEPENDING ON CLINICAL CONDITION.   Kelly Booth M.D on 07/04/2018 at 5:11 PM  Between 7am to 6pm - Pager [phone]  After 6pm go to [email or web address] - Social research officer, government  [facility]  Hospitalists  Office  [phone]  CC: Primary care physician; Raynelle Bring  Note: This dictation was prepared with Dragon dictation along with smaller phrase technology. Any transcriptional errors that result from this process are unintentional.

## 2018-07-05 LAB — URINE CULTURE: Culture: >=100000 — AB

## 2018-07-05 LAB — CBC
HCT: 31.1 % — ABNORMAL LOW (ref 35.0–47.0)
HEMOGLOBIN: 11.2 g/dL — AB (ref 12.0–16.0)
MCH: 35.8 pg — AB (ref 26.0–34.0)
MCHC: 36.1 g/dL — ABNORMAL HIGH (ref 32.0–36.0)
MCV: 99.2 fL (ref 80.0–100.0)
PLATELETS: 212 10*3/uL (ref 150–440)
RBC: 3.14 MIL/uL — AB (ref 3.80–5.20)
RDW: 13.2 % (ref 11.5–14.5)
WBC: 12.8 10*3/uL — AB (ref 3.6–11.0)

## 2018-07-05 LAB — BASIC METABOLIC PANEL
ANION GAP: 8 (ref 5–15)
BUN: 11 mg/dL (ref 8–23)
CALCIUM: 8.4 mg/dL — AB (ref 8.9–10.3)
CO2: 28 mmol/L (ref 22–32)
CREATININE: 0.82 mg/dL (ref 0.44–1.00)
Chloride: 104 mmol/L (ref 98–111)
GFR, EST NON AFRICAN AMERICAN: 59 mL/min — AB (ref >60)
Glucose, Bld: 116 mg/dL — ABNORMAL HIGH (ref 70–99)
Potassium: 3.8 mmol/L (ref 3.5–5.1)
SODIUM: 140 mmol/L (ref 135–145)

## 2018-07-05 LAB — LEGIONELLA PNEUMOPHILA SEROGP 1 UR AG: L. PNEUMOPHILA SEROGP 1 UR AG: NEGATIVE

## 2018-07-05 LAB — TROPONIN I
TROPONIN I: 0.98 ng/mL — AB (ref <0.03)
Troponin I: 0.83 ng/mL (ref <0.03)

## 2018-07-05 LAB — PROCALCITONIN: PROCALCITONIN: 0.15 ng/mL

## 2018-07-05 LAB — PHOSPHORUS: PHOSPHORUS: 2.4 mg/dL — AB (ref 2.5–4.6)

## 2018-07-05 LAB — HEPARIN LEVEL (UNFRACTIONATED): Heparin Unfractionated: 0.37 IU/mL (ref 0.30–0.70)

## 2018-07-05 LAB — MAGNESIUM: Magnesium: 2.1 mg/dL (ref 1.7–2.4)

## 2018-07-05 MED ORDER — AMOXICILLIN-POT CLAVULANATE 500-125 MG PO TABS
500.0000 mg | ORAL_TABLET | Freq: Two times a day (BID) | ORAL | Status: DC
  Filled 2018-07-05: qty 1

## 2018-07-05 MED ORDER — AMOXICILLIN-POT CLAVULANATE 400-57 MG/5ML PO SUSR
500.0000 mg | Freq: Two times a day (BID) | ORAL | Status: DC
  Administered 2018-07-05 – 2018-07-07 (×4): 500 mg via ORAL
  Filled 2018-07-05: qty 6.3
  Filled 2018-07-05: qty 10
  Filled 2018-07-05 (×4): qty 6.3

## 2018-07-05 MED ORDER — ENOXAPARIN SODIUM 30 MG/0.3ML ~~LOC~~ SOLN
30.0000 mg | SUBCUTANEOUS | Status: DC
  Administered 2018-07-05 – 2018-07-06 (×2): 30 mg via SUBCUTANEOUS
  Filled 2018-07-05 (×2): qty 0.3

## 2018-07-05 NOTE — Progress Notes (Signed)
[facility] - [place] at [facility]   PATIENT NAME: Kelly Booth    MR#:  [id number]  DATE OF BIRTH:  01-Sep-1920  SUBJECTIVE:  Doing well this morning. No shortness of breath or chest pain. Son considering hospice.  REVIEW OF SYSTEMS:  Review of Systems  Constitutional: Negative for chills and fever.  HENT: Negative for congestion and sore throat.   Eyes: Negative for blurred vision and double vision.  Respiratory: Positive for cough. Negative for shortness of breath.   Cardiovascular: Negative for chest pain, palpitations and leg swelling.  Gastrointestinal: Negative for abdominal pain, nausea and vomiting.  Genitourinary: Negative for dysuria and frequency.  Musculoskeletal: Negative for back pain and neck pain.  Neurological: Negative for dizziness and headaches.  Psychiatric/Behavioral: Negative for depression. The patient is not nervous/anxious.     DRUG ALLERGIES:   Allergies  Allergen Reactions  . Ceclor [Cefaclor]     Passed out    VITALS:  Blood pressure (!) 115/54, pulse 70, temperature 98.1 F (36.7 C), temperature source Oral, resp. rate 18, height 5' (1.524 m), weight 36.6 kg, SpO2 95 %. PHYSICAL EXAMINATION:  Physical Exam  Constitutional:  Thin-appearing, resting comfortably in bed  HENT:  Head: Normocephalic and atraumatic.  Mildly dry mucous membranes  Eyes: Pupils are equal, round, and reactive to light. EOM are normal.  Neck: Normal range of motion. Neck supple.  Cardiovascular: Normal rate and regular rhythm.  +loud, high-pitched systolic murmur  Pulmonary/Chest: Effort normal and breath sounds normal. No respiratory distress.  +scattered rhonchi, no wheezing  Abdominal: Soft. Bowel sounds are normal. She exhibits no distension. There is no tenderness.  Musculoskeletal: Normal range of motion.       Right lower leg: She exhibits no edema.       Left lower leg: She exhibits no edema.  Neurological: She is alert. No cranial nerve  deficit.  Skin: Skin is warm and dry. Capillary refill takes less than 2 seconds. No rash noted.  Psychiatric: She has a normal mood and affect. Her behavior is normal.   LABORATORY PANEL:  Female CBC Recent Labs  Lab 07/05/18 0646  WBC 12.8*  HGB 11.2*  HCT 31.1*  PLT 212   ------------------------------------------------------------------------------------------------------------------ Chemistries  Recent Labs  Lab 07/03/18 0319  07/05/18 0646  NA 139   < > 140  K 3.9   < > 3.8  CL 97*   < > 104  CO2 31   < > 28  GLUCOSE 103*   < > 116*  BUN 24*   < > 11  CREATININE 0.77   < > 0.82  CALCIUM 9.1   < > 8.4*  MG 1.8  --  2.1  AST 27  --   --   ALT 9  --   --   ALKPHOS 69  --   --   BILITOT 1.2  --   --    < > = values in this interval not displayed.   RADIOLOGY:  No results found. ASSESSMENT AND PLAN:   Acute on chronic hypoxic respiratory failure- secondary to bilateral lower lobe pneumonia (CAP vs aspiration). Initially using BiPAP, but now stable on room air - continue augmentin and azithromycin - blood cultures with no growth  - seen by SLP- started dysphagia I diet - palliative care consult pending- son considering hospice  Klebsiella UTI- urine cultures growing >100,000 CFU Klebsiella - on augmentin for pneumonia  Elevated troponin- likely due to demand ischemia, patient has  not had any active chest pain this admission. Troponins have downtrended - previously on heparin, will discontinue and start DVT prophylaxis - not a candidate for aggressive intervention  Severe aortic stenosis- stable - palliative care consult  Paroxysmal atrial fibrillation- in NSR - continue metoprolol   Osteoporosis- CTA chest showed multiple compression fractures of the thoracic spine, age indeterminate. Not having acute back pain. - tylenol prn  Severe protein-calorie malnutrition - nutrition consult  Normocytic anemia- hgb at baseline - monitor  Awaiting palliative  consult for dispo to home with hospice or to SNF.  All the records are reviewed and case discussed with Care Management/Social Worker. Management plans discussed with the patient, family and they are in agreement.  CODE STATUS: DNR  TOTAL TIME TAKING CARE OF THIS PATIENT: 35 minutes.   More than 50% of the time was spent in counseling/coordination of care: YES  POSSIBLE D/C IN 1-2 DAYS, DEPENDING ON CLINICAL CONDITION.   Jinny Blossom Mayo M.D on 07/05/2018 at 4:43 PM  Between 7am to 6pm - Pager - [phone]  After 6pm go to [email or web address] - Social research officer, government  [facility] [place] [facility]  Office  [phone]  CC: Primary care physician; Raynelle Bring  Note: This dictation was prepared with Dragon dictation along with smaller phrase technology. Any transcriptional errors that result from this process are unintentional.

## 2018-07-05 NOTE — Clinical Social Work Note (Signed)
Clinical Social Work Assessment  Patient Details  Name: Kelly Booth MRN: [id number] Date of Birth: June 16, 1920  Date of referral:  07/05/18               Reason for consult:  Facility Placement                Permission sought to share information with:  Case Manager, Magazine features editor, Family Supports Permission granted to share information::  Yes, Verbal Permission Granted  Name::        Agency::     Relationship::     Contact Information:     Housing/Transportation Living arrangements for the past 2 months:  Skilled Building surveyor of Information:  Adult Children Patient Interpreter Needed:  None Criminal Activity/Legal Involvement Pertinent to Current Situation/Hospitalization:  No - Comment as needed Significant Relationships:  Adult Children Lives with:  Facility Resident Do you feel safe going back to the place where you live?  Yes Need for family participation in patient care:  Yes (Comment)  Care giving concerns:  Patient is a long term resident at [facility] assessment / plan:  CSW consulted for facility placement. CSW attempted to meet with patient but she was asleep. CSW contacted patient's son Devona Holmes [phone]. Son states that patient is a long term resident at [facility]. Son states that patient is comfort care and they would like hospice home in [place]. Palliative consult is pending. CSW will follow for discharge planning.   Employment status:  Retired Health and safety inspector:  Medicare PT Recommendations:  Not assessed at this time Information / Referral to community resources:     Patient/Family's Response to care:  Son thanked CSW for assistance   Patient/Family's Understanding of and Emotional Response to Diagnosis, Current Treatment, and Prognosis:  Son in agreement with discharge plan   Emotional Assessment Appearance:  Appears stated age Attitude/Demeanor/Rapport:  Unable to Assess Affect  (typically observed):  Unable to Assess Orientation:  Oriented to Self Alcohol / Substance use:  Not Applicable Psych involvement (Current and /or in the community):  No (Comment)  Discharge Needs  Concerns to be addressed:  Discharge Planning Concerns Readmission within the last 30 days:  No Current discharge risk:  None Barriers to Discharge:  Continued Medical Work up   [employer], LCSWA 07/05/2018, 4:43 PM

## 2018-07-05 NOTE — Plan of Care (Signed)
  Problem: Nutrition: Goal: Adequate nutrition will be maintained Note:  Eating small amts. Fed by nurse and nt.  Speech saw today changed to dys 1 with nectar thick liqs slept this pm

## 2018-07-05 NOTE — Progress Notes (Signed)
ANTICOAGULATION CONSULT NOTE - Initial Consult  Pharmacy Consult for heparin Indication: chest pain/ACS  Allergies  Allergen Reactions  . Ceclor [Cefaclor]     Passed out     Patient Measurements: Height: 5' (152.4 cm) Weight: 80 lb 11.2 oz (36.6 kg) IBW/kg (Calculated) : 45.5 Heparin Dosing Weight: 41.7 kg  Vital Signs: Temp: 97.5 F (36.4 C) (10/01 0500) Temp Source: Oral (10/01 0500) BP: 166/57 (10/01 0500) Pulse Rate: 81 (10/01 0500)  Labs: Recent Labs    07/03/18 0319 07/03/18 0602  07/04/18 0109 07/04/18 0918 07/04/18 1745 07/04/18 2313 07/05/18 0646  HGB 11.0*  --   --  10.9*  --   --   --  11.2*  HCT 31.2*  --   --  31.6*  --   --   --  31.1*  PLT 245  --   --  202  --   --   --  212  APTT  --  35  --   --   --   --   --   --   LABPROT  --  14.1  --   --   --   --   --   --   INR  --  1.10  --   --   --   --   --   --   HEPARINUNFRC  --   --    < > 0.38 0.41  --   --  0.37  CREATININE 0.77  --   --  0.82  --   --   --   --   TROPONINI 3.03*  --   --   --   --  0.94* 0.98*  --    < > = values in this interval not displayed.    Estimated Creatinine Clearance: 23.2 mL/min (by C-G formula based on SCr of 0.82 mg/dL).   Medical History: Past Medical History:  Diagnosis Date  . A-fib (HCC)   . Allergic rhinitis   . Constipation   . COPD (chronic obstructive pulmonary disease) (HCC)   . Dementia   . Dyspepsia   . Esophageal reflux   . Generalized weakness   . Glaucoma   . Hypertension   . Osteoporosis   . Pulmonary fibrosis (HCC)    There is no evidence of pulmonary fibrosis on CT chest    Medications:  Scheduled:  . aspirin  81 mg Oral Daily  . azithromycin  250 mg Oral Daily  . Chlorhexidine Gluconate Cloth  6 each Topical Q0600  . famotidine  20 mg Oral Daily  . feeding supplement (ENSURE ENLIVE)  237 mL Oral TID BM  . metoprolol tartrate  12.5 mg Oral BID  . multivitamin with minerals  1 tablet Oral Daily  . mupirocin ointment  1  application Nasal BID    Assessment: Patient admitted for SOB w/ trops 3.03, EKG showing T-wave inversion No PTA anticoagulation Patient being started on heparin drip for possible ACS/UA  Goal of Therapy:  Heparin level 0.3-0.7 units/ml Monitor platelets by anticoagulation protocol: Yes   Plan:  09/30 @ 0100 HL 0.38 therapeutic. Will continue rate at 700 units/hr and will recheck HL @ 0900. hgb stable, will continue to monitor.  10/01 AM heparin level 0.37. Continue current regimen. Recheck heparin level and CBC with tomorrow AM labs.  Fulton Reek, PharmD, BCPS  07/05/18 7:10 AM

## 2018-07-05 NOTE — Evaluation (Signed)
Clinical/Bedside Swallow Evaluation Patient Details  Name: Kelly Booth MRN: [id number] Date of Birth: 1920-06-03  Today's Date: 07/05/2018 Time: SLP Start Time (ACUTE ONLY): 1120 SLP Stop Time (ACUTE ONLY): 1215 SLP Time Calculation (min) (ACUTE ONLY): 55 min  Past Medical History:  Past Medical History:  Diagnosis Date  . A-fib (HCC)   . Allergic rhinitis   . Constipation   . COPD (chronic obstructive pulmonary disease) (HCC)   . Dementia   . Dyspepsia   . Esophageal reflux   . Generalized weakness   . Glaucoma   . Hypertension   . Osteoporosis   . Pulmonary fibrosis (HCC)    There is no evidence of pulmonary fibrosis on CT chest   Past Surgical History:  Past Surgical History:  Procedure Laterality Date  . ABDOMINAL HYSTERECTOMY    . TOTAL HIP ARTHROPLASTY     HPI:  Pt is a 84 y.o. female with a known history of moderate malnutrition per PCP notes, COPD, aortic stenosis, pulmonary fibrosis, GERD on PPIs, Afib (no AC, Hx GIB 06/2016), SSS (no PPM) who p/w increased tachypnea/SOB, AMS. Unable to give information; Son at bedside in ED stated pt resides at [facility]. Unsure of pt's baseline Cognitive status. Per Chest CT, Small bilateral pleural effusions and bilateral lower lobe partial consolidative changes and findings of pneumonia; Mucous plugging or secretions in the bilateral lower lobe bronchi.   Assessment / Plan / Recommendation Clinical Impression  Pt appears to present w/ overt presentation of oropharyngeal phase dysphagia w/ increased risk for aspiration thus impact on the Pulmonary status; also risk for inability to sufficiently, safely meet her nutritional needs. Pt consumed po trials of Nectar consistency liquids via TSP then puree foods no immediate, overt s/s of aspiration noted; no decline in respiratory status post trials. However, suspect delay in pharyngeal swallowing and noted multiple swallows to clear boluses. During the oral phase, pt exhibited  prolonged oral phase time w/ oral holding and min residue post swallow; overall decreased attention to bolus/task. Over time and given verbal/tactile cues, she achieved A-P transfer and cleared boluses w/ no increased of bolus residue in the buccal areas orally. Gave mod. verbal/tactile cues frequently during the po trials to promote oral attention to task/po's. Much support was given to achieve a more mid-line head positioning for safer, comfortable swallowing as pt was in a more fetal position w/ head downward. Pt was unable to follow through w/ instructions effectively. OM exam revealed overall weakness w/ open-mouth posture at rest. Due to pt's increased risk for aspiration from Dysphagia at this time in light of her declined Cognitive presentation(unsure of her baseline status), recommend a Dysphagia level 1 w/ Nectar liquids diet w/ aspiration precautions; Pills in Puree - Crushed. Feeding Support at all meals; Supervision. NSG updated.  SLP Visit Diagnosis: Dysphagia, oropharyngeal phase (R13.12)    Aspiration Risk  Moderate aspiration risk;Risk for inadequate nutrition/hydration    Diet Recommendation  Dysphagia level 1 (PUREE) w/ Nectar consistency liquids by TSP; strict aspiration precautions; feeding support and Supervision at all meals.  Medication Administration: Crushed with puree(as able for safer swallowing)    Other  Recommendations Recommended Consults: (Dietician f/u; Palliative care f/u) Oral Care Recommendations: Oral care BID;Staff/trained caregiver to provide oral care Other Recommendations: Order thickener from pharmacy;Prohibited food (jello, ice cream, thin soups);Remove water pitcher;Have oral suction available   Follow up Recommendations Skilled Nursing facility      Frequency and Duration min 2x/week  1 week  Prognosis Prognosis for Safe Diet Advancement: Guarded Barriers to Reach Goals: Cognitive deficits;Time post onset;Severity of deficits      Swallow  Study   General Date of Onset: 07/03/18 HPI: Pt is a 84 y.o. female with a known history of moderate malnutrition per PCP notes, COPD, aortic stenosis, pulmonary fibrosis, GERD on PPIs, Afib (no AC, Hx GIB 06/2016), SSS (no PPM) who p/w increased tachypnea/SOB, AMS. Unable to give information; Son at bedside in ED stated pt resides at [facility]. Unsure of pt's baseline Cognitive status. Per Chest CT, Small bilateral pleural effusions and bilateral lower lobe partial consolidative changes and findings of pneumonia; Mucous plugging or secretions in the bilateral lower lobe bronchi. Type of Study: Bedside Swallow Evaluation Previous Swallow Assessment: unknown Diet Prior to this Study: Regular;Thin liquids Temperature Spikes Noted: No(wbc lowering to 12.8) Respiratory Status: Room air History of Recent Intubation: No Behavior/Cognition: Cooperative;Confused;Distractible;Requires cueing(Awake but required cues) Oral Cavity Assessment: Dry(sticky) Oral Care Completed by SLP: Yes Oral Cavity - Dentition: Missing dentition Vision: (n/a) Self-Feeding Abilities: Total assist Patient Positioning: Postural control adequate for testing(head forward; min. on side d/t fetal positioning) Baseline Vocal Quality: (no verbalizations) Volitional Cough: Cognitively unable to elicit Volitional Swallow: Unable to elicit    Oral/Motor/Sensory Function Overall Oral Motor/Sensory Function: Generalized oral weakness(open mouth posture at rest)   Ice Chips Ice chips: Not tested Other Comments: d/t Cognitive decline and presentation   Thin Liquid Thin Liquid: Not tested Other Comments: d/t Cognitive decline and presentation    Nectar Thick Nectar Thick Liquid: Impaired Presentation: Spoon(fed; 10 tsp trials) Oral Phase Impairments: Reduced labial seal;Reduced lingual movement/coordination;Poor awareness of bolus Oral phase functional implications: Prolonged oral transit;Oral residue(slight) Pharyngeal  Phase Impairments: Suspected delayed Swallow;Multiple swallows Other Comments: impact from declined cognitive status    Honey Thick Honey Thick Liquid: Not tested   Puree Puree: Impaired Presentation: Spoon(fed; 8 tsp trials) Oral Phase Impairments: Reduced labial seal;Reduced lingual movement/coordination;Poor awareness of bolus Oral Phase Functional Implications: Prolonged oral transit;Oral residue;Oral holding(min) Pharyngeal Phase Impairments: Suspected delayed Swallow;Multiple swallows Other Comments: impact from declined cognitive status   Solid     Solid: Not tested Other Comments: d/t declined cognitive status       Jerilynn Som, MS, CCC-SLP Watson,Katherine 07/05/2018,5:04 PM

## 2018-07-05 NOTE — Progress Notes (Signed)
Pharmacy Electrolyte Monitoring Consult:  Pharmacy consulted to assist in monitoring and replacing electrolytes in this 84 y.o. female admitted on 07/03/2018 with Shortness of Breath   Labs:  Sodium (mmol/L)  Date Value  07/05/2018 140  08/21/2014 138   Potassium (mmol/L)  Date Value  07/05/2018 3.8  08/21/2014 3.8   Magnesium (mg/dL)  Date Value  16/07/9603 2.1  06/26/2013 2.0   Phosphorus (mg/dL)  Date Value  54/06/8118 2.4 (L)  10/18/2013 2.8   Calcium (mg/dL)  Date Value  14/78/2956 8.4 (L)   Calcium, Total (mg/dL)  Date Value  21/30/8657 9.7   Albumin (g/dL)  Date Value  84/69/6295 3.0 (L)  08/21/2014 3.7    Assessment/Plan: electrolytes acceptable. Pharmacy will sign off.   Olene Floss, PharmD 07/05/2018 3:34 PM

## 2018-07-06 LAB — BASIC METABOLIC PANEL
Anion gap: 6 (ref 5–15)
BUN: 12 mg/dL (ref 8–23)
CHLORIDE: 106 mmol/L (ref 98–111)
CO2: 29 mmol/L (ref 22–32)
CREATININE: 0.73 mg/dL (ref 0.44–1.00)
Calcium: 8.4 mg/dL — ABNORMAL LOW (ref 8.9–10.3)
GFR calc Af Amer: >60 mL/min (ref >60)
GLUCOSE: 95 mg/dL (ref 70–99)
Potassium: 3.8 mmol/L (ref 3.5–5.1)
SODIUM: 141 mmol/L (ref 135–145)

## 2018-07-06 LAB — CBC
HCT: 28.8 % — ABNORMAL LOW (ref 35.0–47.0)
Hemoglobin: 10.2 g/dL — ABNORMAL LOW (ref 12.0–16.0)
MCH: 35.6 pg — ABNORMAL HIGH (ref 26.0–34.0)
MCHC: 35.5 g/dL (ref 32.0–36.0)
MCV: 100.2 fL — AB (ref 80.0–100.0)
PLATELETS: 199 10*3/uL (ref 150–440)
RBC: 2.88 MIL/uL — ABNORMAL LOW (ref 3.80–5.20)
RDW: 13.1 % (ref 11.5–14.5)
WBC: 9 10*3/uL (ref 3.6–11.0)

## 2018-07-06 LAB — HEPARIN LEVEL (UNFRACTIONATED): Heparin Unfractionated: 0.24 IU/mL — ABNORMAL LOW (ref 0.30–0.70)

## 2018-07-06 NOTE — Progress Notes (Signed)
PALLIATIVE NOTE:  Planned to have GOC discussion with son today who originally expressed potential need for hospice. Son requested to meet at 0830 and contacted and request to meet at 10am. Unfortunately he was not availble at this time either. I contacted him via phone and expressed he would rather meet tomorrow morning at 0930 because he felt that his wife and another family member should be present and they were not available today.   Son reports patient is not comfort care at this time and that he did not want to make any changes until we meet on tomorrow. He did verbalized that he did not want any aggressive measures performed and also confirmed DNR/DNI.   Detailed note and recommendations after discussion on tomorrow. I have spoken with Diannia Ruder, RN [facility]) she reported that she has several open beds and patient could most likely be transferred there tomorrow if family decides to discharge to hospice home.   Thank you for referral.   Willette Alma, AGPCNP-BC Palliative Medicine Team  Phone: [phone] Fax: [phone] Pager: [phone] Amion: Dorris Carnes. Cousar

## 2018-07-06 NOTE — Care Management Important Message (Signed)
Important Message  Patient Details  Name: BRADLEY HANDYSIDE MRN: [id number] Date of Birth: [birth year]   Medicare Important Message Given:       Gwenette Greet, RN 07/06/2018, 12:04 PM

## 2018-07-06 NOTE — Progress Notes (Signed)
  Speech Language Pathology Treatment: Dysphagia  Patient Details Name: Kelly Booth MRN: [id number] DOB: 09/13/1920 Today's Date: 07/06/2018 Time: 0830-0905 SLP Time Calculation (min) (ACUTE ONLY): 35 min  Assessment / Plan / Recommendation Clinical Impression  Pt seen for ongoing assessment of toleration of least restrictive oral diet; education w/ family, caregivers. Son and family present in room helping to feed pt her breakfast meal this morning. Pt was awake but noted eyes were closed often necessitating verbal/tactile cues to realert her to the po task. No verbalizations noted.  Pt continues to appear to present w/ overt presentation of oropharyngeal phase dysphagia w/ increased risk for aspiration thus impact on the Pulmonary status; also risk for inability to sufficiently, and safely, meet her nutritional needs adequately as she exhibits fatigue, weakness, and closed eyes often during the meal - she is a dependent feeder as well. Pt consumed po trials of Nectar consistency liquids via TSP then puree foods no immediate, overt s/s of aspiration noted; no decline in respiratory status post trials. However, suspect delay in pharyngeal swallowing and noted multiple swallows to clear boluses. Pt also exhibited a mild, delayed cough x1 episode post trial of Nectar liquids. During the oral phase, pt exhibited prolonged oral phase time w/ po trials. Given time and min verbal/tactile cues when needed, she achieved A-P transfer and cleared boluses w/ no increased oral bolus residue noted. Son gave min verbal/tactile cues frequently during the po trials to promote oral attention to task/po's.  Due to pt's oropharyngeal phase dysphagia, declined Cognitive status, and increased risk for aspiration, recommend a Dysphagia level 1 w/ Nectar liquids diet w/ aspiration precautions; Pills in Puree - Crushed. Feeding Support at all meals; Supervision. NSG updated.  Education given to family present on aspiration  precautions; dysphagia; impact of illness and cognitive decline on swallowing; risk for Pulmonary decline from prandial aspiration; food consistency(puree) and preparation. Family agreed.    HPI HPI: Pt is a 84 y.o. female with a known history of moderate malnutrition per PCP notes, COPD, aortic stenosis, pulmonary fibrosis, GERD on PPIs, Afib (no AC, Hx GIB 06/2016), SSS (no PPM) who p/w increased tachypnea/SOB, AMS. Unable to give information; Son at bedside in ED stated pt resides at [facility]. Unsure of pt's baseline Cognitive status. Per Chest CT, Small bilateral pleural effusions and bilateral lower lobe partial consolidative changes and findings of pneumonia; Mucous plugging or secretions in the bilateral lower lobe bronchi.      SLP Plan  Continue with current plan of care       Recommendations  Diet recommendations: Dysphagia 1 (puree);Nectar-thick liquid Liquids provided via: Teaspoon(possibly Cup w/ monitoring by feeder) Medication Administration: Crushed with puree(as able to ) Supervision: Staff to assist with self feeding;Full supervision/cueing for compensatory strategies Compensations: Minimize environmental distractions;Slow rate;Small sips/bites;Lingual sweep for clearance of pocketing;Follow solids with liquid;Multiple dry swallows after each bite/sip Postural Changes and/or Swallow Maneuvers: Seated upright 90 degrees;Upright 30-60 min after meal                General recommendations: (Dietician f/u; Palliative Care f/u) Oral Care Recommendations: Oral care BID;Staff/trained caregiver to provide oral care Follow up Recommendations: Skilled Nursing facility(TBD) SLP Visit Diagnosis: Dysphagia, oropharyngeal phase (R13.12) Plan: Continue with current plan of care       GO                Jerilynn Som, MS, CCC-SLP Nevaeh Korte 07/06/2018, 1:11 PM

## 2018-07-06 NOTE — Progress Notes (Signed)
[employer] [facility] [facility] [place] [facility]   PATIENT NAME: Kelly Booth    MR#:  [id number]  DATE OF BIRTH:  1919/10/11  SUBJECTIVE:   waiting for palliative care consult final disposition.  Discussed the same with patient's son.  REVIEW OF SYSTEMS:  Unable to obtain review of systems because of dementia  DRUG ALLERGIES:   Allergies  Allergen Reactions  . Ceclor [Cefaclor]     Passed out    VITALS:  Blood pressure (!) 150/48, pulse 73, temperature 98.8 F (37.1 C), resp. rate (!) 30, height 5' (1.524 m), weight 36.6 kg, SpO2 (!) 88 %. PHYSICAL EXAMINATION:  Physical Exam  Constitutional:  Thin-appearing, resting comfortably in bed  HENT:  Head: Normocephalic and atraumatic.  Mildly dry mucous membranes  Eyes: Pupils are equal, round, and reactive to light. EOM are normal.  Neck: Normal range of motion. Neck supple.  Cardiovascular: Normal rate and regular rhythm.  +loud, high-pitched systolic murmur  Pulmonary/Chest: Effort normal and breath sounds normal. No respiratory distress.  +scattered rhonchi, no wheezing  Abdominal: Soft. Bowel sounds are normal. She exhibits no distension. There is no tenderness.  Musculoskeletal: Normal range of motion.       Right lower leg: She exhibits no edema.       Left lower leg: She exhibits no edema.  Neurological: She is alert. No cranial nerve deficit.  Skin: Skin is warm and dry. Capillary refill takes less than 2 seconds. No rash noted.  Psychiatric: She has a normal mood and affect. Her behavior is normal.   LABORATORY PANEL:  Female CBC Recent Labs  Lab 07/06/18 0328  WBC 9.0  HGB 10.2*  HCT 28.8*  PLT 199   ------------------------------------------------------------------------------------------------------------------ Chemistries  Recent Labs  Lab 07/03/18 0319  07/05/18 0646 07/06/18 0328  NA 139   < > 140 141  K 3.9   < > 3.8 3.8  CL 97*   < > 104 106  CO2 31   < > 28 29  GLUCOSE 103*   < >  116* 95  BUN 24*   < > 11 12  CREATININE 0.77   < > 0.82 0.73  CALCIUM 9.1   < > 8.4* 8.4*  MG 1.8  --  2.1  --   AST 27  --   --   --   ALT 9  --   --   --   ALKPHOS 69  --   --   --   BILITOT 1.2  --   --   --    < > = values in this interval not displayed.   RADIOLOGY:  No results found. ASSESSMENT AND PLAN:   Acute on chronic hypoxic respiratory failure- secondary to bilateral lower lobe pneumonia (CAP vs aspiration). Initially using BiPAP, but now stable on room air - continue augmentin and azithromycin - blood cultures with no growth  - seen by SLP- started dysphagia I diet - palliative care consult pending- son considering hospice Discussed with son today.  Appreciate palliative care team following today.  Klebsiella UTI- urine cultures growing >100,000 CFU Klebsiella - on augmentin for pneumonia  Elevated troponin- likely due to demand ischemia, patient has not had any active chest pain this admission. Troponins have downtrended - previously on heparin, will discontinue and start DVT prophylaxis - not a candidate for aggressive intervention  Severe aortic stenosis- stable - palliative care consult  Paroxysmal atrial fibrillation- in NSR - continue metoprolol   Osteoporosis-  CTA chest showed multiple compression fractures of the thoracic spine, age indeterminate. Not having acute back pain. - tylenol prn  Severe protein-calorie malnutrition Patient not eating much as per son .ideal candidate for hospice home  Normocytic anemia- hgb at baseline - monitor  Awaiting palliative consult for dispo to home with hospice or to SNF.  All the records are reviewed and case discussed with Care Management/Social Worker. Management plans discussed with the patient, family and they are in agreement.  CODE STATUS: DNR  TOTAL TIME TAKING CARE OF THIS PATIENT: 35 minutes.   More than 50% of the time was spent in counseling/coordination of care: YES  POSSIBLE D/C IN 1-2  DAYS, DEPENDING ON CLINICAL CONDITION.   Katha Hamming M.D on 07/06/2018 at 8:36 AM  Between 7am to 6pm - Pager - [phone]  After 6pm go to [email or web address] - Social research officer, government  [facility]  Office  [phone]  CC: Primary care physician; Raynelle Bring  Note: This dictation was prepared with Dragon dictation along with smaller phrase technology. Any transcriptional errors that result from this process are unintentional.

## 2018-07-07 DIAGNOSIS — Z7189 Other specified counseling: Secondary | ICD-10-CM

## 2018-07-07 DIAGNOSIS — E43 Unspecified severe protein-calorie malnutrition: Secondary | ICD-10-CM

## 2018-07-07 DIAGNOSIS — F0390 Unspecified dementia without behavioral disturbance: Secondary | ICD-10-CM

## 2018-07-07 DIAGNOSIS — Z515 Encounter for palliative care: Secondary | ICD-10-CM

## 2018-07-07 DIAGNOSIS — Z66 Do not resuscitate: Secondary | ICD-10-CM

## 2018-07-07 MED ORDER — AMOXICILLIN-POT CLAVULANATE 400-57 MG/5ML PO SUSR: 500.0000 mg | Freq: Two times a day (BID) | ORAL | 0 refills | Status: AC

## 2018-07-07 MED ORDER — AZITHROMYCIN 250 MG PO TABS: 250.0000 mg | ORAL_TABLET | Freq: Every day | ORAL | 0 refills | Status: DC

## 2018-07-07 MED ORDER — ENSURE ENLIVE PO LIQD: 237.0000 mL | Freq: Three times a day (TID) | ORAL | 12 refills | Status: AC

## 2018-07-07 MED ORDER — MUPIROCIN 2 % EX OINT: 1.0000 "application " | TOPICAL_OINTMENT | Freq: Two times a day (BID) | CUTANEOUS | 0 refills | Status: AC

## 2018-07-07 MED ORDER — ADULT MULTIVITAMIN W/MINERALS CH: 1.0000 | ORAL_TABLET | Freq: Every day | ORAL | 0 refills | Status: AC

## 2018-07-07 NOTE — Consult Note (Signed)
Consultation Note Date: 07/07/2018   Patient Name: Kelly Booth  DOB: Mar 25, 1920  MRN: [id number]  Age / Sex: 84 y.o., female  PCP: Katheren Shams Referring Physician: Epifanio Lesches, MD  Reason for Consultation: Establishing goals of care  HPI/Patient Profile: 84 y.o. female admitted on 07/03/2018 from [place] facility with complaints of generalized weakness and altered mental status. She has a past medical history significant for atrial fibrillation (no current anticoagulants due to history of GIB), GERD, COPD, altered mental status, hypertension, osteoporosis, glaucoma, and SSS.  Patient son was at the bedside and provided history.  During ED course son reports that patient appeared to not be feeling well for several days.  He generally visits his mother twice a day during the week and once on Saturdays and Sundays.  Son reported that she was not thinking clearly for approximately a week.  He also reports that one patient has a urinary tract infection she generally has a similar presentation of altered mental status.  On presentation her vital signs were normal with a temperature of 99.9 and her oxygen saturations were 91% on room air.  UA was positive for UTI.  BBC 20.4.  Troponin III 0.03.  BNP 1264.  Since admission chest x-ray showed bilateral lower lobe pneumonia and aspiration.  She was placed on BiPAP but later able to wean to room air.  She received antibiotics for her UTI.  Patient was seen by SLP and started on a dysphagia 1 diet with nectar thick liquids due to high risk of aspiration, and also seen by cardiology which deemed she was not a candidate for aggressive interventions.  Troponins trended down.  Palliative medicine team consulted for goals of care.  Clinical Assessment and Goals of Care: I have reviewed medical records including lab results, imaging, Epic notes, and MAR, received  report from the bedside RN, and assessed the patient. I then met at the bedside with patient's son Kelly Booth and his wife to discuss diagnosis prognosis, GOC, EOL wishes, disposition and options.  Patient is awake and alert to herself and son.  He is currently feeding herself breakfast.  She is unable to appropriately engage in goals of care discussion with her son and I.  I introduced Palliative Medicine as specialized medical care for people living with serious illness. It focuses on providing relief from the symptoms and stress of a serious illness. The goal is to improve quality of life for both the patient and the family.  We discussed a brief life review of the patient.  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  States that he is the only child.  As far as functional and nutritional status son reports patient has been a long-term resident at [facility].  Prior to this patient is a resident there in their independent living area.  She transition to skilled nursing after having reoccurring UTIs and suffering from a fall back in December.  Son reports he has noticed over the past several months she has exhibited signs of  generalized weakness and decreased strength.  He reports that she has not ambulated independently in several months and generally spends the day in a wheelchair.  He states her appetite continues to decline and she would generally eat a healthy breakfast and refused to eat lunch or dinner or if she did she would only eat small bites.  We discussed her current illness and what it means in the larger context of her on-going co-morbidities.  Natural disease trajectory and expectations at EOL were discussed.  Family verbalizes understanding of patient's current illness and comorbidities.  Son reports when she initially came in he felt that she would be most appropriate for hospice services and potentially hospice home.  He now states he remains more hopeful seeing that she  has now more awake and alert and able to feed herself.  He states that he feels this is the mother that he is familiar with.  Son reports his wishes for his mother to be able to have a regular diet with regular liquids versus nectar thick and dysphasia.  I educated son and his wife in great detail in regards to patient being a high risk of aspiration and continue with signs of aspiration while drinking such as coughing.  We also discussed the risk of aspiration such as recurrent pneumonia and more severe complications leading to death.  Son verbalized understanding and the risk for his mother to continue to drink thin liquids and regular foods.  He states "I want her to be able to have what ever it is that she wants to have as long as she is eating" I explained to him that based on his wishes for his mother it sounds as though he would like her to have more of a comfort care approach allowing her to have comfort feedings with awareness of risk of aspiration and further medical complications.  Son verbalizes he wants her to be able to have what she wants however he is not prepared for her to receive full comfort care based on her improvement in mental status.  I attempted to elicit values and goals of care important to the patient and her family.  The difference between aggressive medical intervention and comfort care was considered in light of the patient's goals of care.  At this time son would like to continue to treat the treatable while hospitalized.  He states he is not prepared to shift care to comfort however he knows that that may be expected in the near future.  Advanced directives, concepts specific to code status, artifical feeding and hydration, and rehospitalization were considered and discussed.  Son confirms patient is a DNR/DNI.  He also expressed that patient would not want any forms of artificial feedings such as PEG tube placement.  Again he continues to express that she only wants to eat  whatever it is that she can tolerate and wants.  Hospice and Palliative Care services outpatient were explained and offered.  Given patient's comorbidities, recurrent UTIs, and high risk of aspiration patient be a candidate for outpatient hospice services.  Explained to son that patient could return to home feels with hospice services versus palliative.  He expressed at this time he cannot make a decision to place his mother on the hospice services after seeing that she has shown signs of improvement and prefers to have palliative services follow her back at her facility.  Son and wife educated if patient continued to show signs of decline that he may discussed transitioning patient  to outpatient hospice services with his outpatient palliative team and they can assist with this transition.  Family verbalized understanding and appreciation.  Questions and concerns were addressed.  Hard Choices booklet left for review. The family was encouraged to call with questions or concerns.  PMT will continue to support holistically.  HCPOA/son-Ronald Blanford    SUMMARY OF RECOMMENDATIONS    DNR/DNI-as confirmed by son/POA  Continue to treat the treatable while hospitalized without aggressive measures.  Son verbalizes he would like for patient to have outpatient palliative care services at [facility].  He declines to initiate hospice services given patient is more awake and alert today and able to feed herself.  CSW is aware of family's request for outpatient palliative services.  PMT will continue to follow and support as needed.  Code Status/Advance Care Planning:  DNR/DNI   Palliative Prophylaxis:   Aspiration, Bowel Regimen, Delirium Protocol, Oral Care and Turn Reposition  Additional Recommendations (Limitations, Scope, Preferences):  Full Scope Treatment-we will treat the treatable without aggressive measures.  Psycho-social/Spiritual:   Desire for further Chaplaincy support: No  Additional  Recommendations: Education on Hospice  Prognosis:   Unable to determine-guarded to poor in the setting of acute on chronic respiratory failure with hypoxemia, hypertension, COPD, dementia, GERD, generalized weakness, severe protein calorie malnutrition, decreased mobility, poor p.o. intake, osteoporosis, and pulmonary fibrosis.  Discharge Planning: Family requesting patient return back to SNF with outpatient palliative services.      Primary Diagnoses: Present on Admission: . Acute on chronic respiratory failure with hypoxemia ([place])   I have reviewed the medical record, interviewed the patient and family, and examined the patient. The following aspects are pertinent.  Past Medical History:  Diagnosis Date  . A-fib ([place])   . Allergic rhinitis   . Constipation   . COPD (chronic obstructive pulmonary disease) ([place])   . Dementia   . Dyspepsia   . Esophageal reflux   . Generalized weakness   . Glaucoma   . Hypertension   . Osteoporosis   . Pulmonary fibrosis ([facility])    There is no evidence of pulmonary fibrosis on CT chest   Social History   Socioeconomic History  . Marital status: Widowed    Spouse name: Not on file  . Number of children: Not on file  . Years of education: Not on file  . Highest education level: Not on file  Occupational History  . Not on file  Social Needs  . Financial resource strain: Not on file  . Food insecurity:    Worry: Not on file    Inability: Not on file  . Transportation needs:    Medical: Not on file    Non-medical: Not on file  Tobacco Use  . Smoking status: Never Smoker  . Smokeless tobacco: Never Used  Substance and Sexual Activity  . Alcohol use: No    Alcohol/week: 0.0 standard drinks  . Drug use: No  . Sexual activity: Not Currently    Birth control/protection: None  Lifestyle  . Physical activity:    Days per week: Not on file    Minutes per session: Not on file  . Stress: Not on file  Relationships  . Social  connections:    Talks on phone: Not on file    Gets together: Not on file    Attends religious service: Not on file    Active member of club or organization: Not on file    Attends meetings of clubs or organizations: Not on  file    Relationship status: Not on file  Other Topics Concern  . Not on file  Social History Narrative  . Not on file   Family History  Problem Relation Age of Onset  . Hypertension Other    Scheduled Meds: . amoxicillin-clavulanate  500 mg Oral Q12H  . aspirin  81 mg Oral Daily  . azithromycin  250 mg Oral Daily  . Chlorhexidine Gluconate Cloth  6 each Topical Q0600  . enoxaparin (LOVENOX) injection  30 mg Subcutaneous Q24H  . famotidine  20 mg Oral Daily  . feeding supplement (ENSURE ENLIVE)  237 mL Oral TID BM  . metoprolol tartrate  12.5 mg Oral BID  . multivitamin with minerals  1 tablet Oral Daily  . mupirocin ointment  1 application Nasal BID   Continuous Infusions: . dextrose 5% lactated ringers 35 mL/hr at 07/07/18 0509   PRN Meds:.acetaminophen, bisacodyl, ipratropium-albuterol, morphine injection, nitroGLYCERIN, ondansetron (ZOFRAN) IV, senna-docusate Medications Prior to Admission:  Prior to Admission medications   Medication Sig Start Date End Date Taking? Authorizing Provider  acetaminophen (TYLENOL) 160 MG/5ML elixir Take 15 mg/kg by mouth every 4 (four) hours as needed for fever.   Yes [provider]  albuterol (PROVENTIL) (2.5 MG/3ML) 0.083% nebulizer solution Take 3 mLs (2.5 mg total) by nebulization every 6 (six) hours as needed for wheezing or shortness of breath. 10/15/17  Yes Fritzi Mandes, MD  bisacodyl (STIMULANT LAXATIVE) 5 MG EC tablet Take 5 mg by mouth 2 (two) times daily.  09/18/15  Yes [provider]  Calcium Carbonate-Vitamin D (CALCIUM-VITAMIN D) 500-200 MG-UNIT tablet Take 1 tablet by mouth 2 (two) times daily. 09/18/15  Yes [provider]  Cholecalciferol (VITAMIN D3) 1000 units CAPS Take 1  capsule by mouth daily. 09/18/15  Yes [provider]  ferrous sulfate 325 (65 FE) MG tablet Take 1 tablet by mouth daily. 09/18/15  Yes [provider]  furosemide (LASIX) 40 MG tablet Take 40 mg by mouth daily.  09/18/15  Yes [provider]  Ipratropium-Albuterol (COMBIVENT) 20-100 MCG/ACT AERS respimat Inhale 1 puff into the lungs 4 (four) times daily. 09/18/15  Yes [provider]  latanoprost (XALATAN) 0.005 % ophthalmic solution Apply 1 drop to eye at bedtime. 09/18/15  Yes [provider]  metoprolol tartrate (LOPRESSOR) 25 MG tablet Take 0.5 tablets (12.5 mg total) by mouth 2 (two) times daily. 06/11/16  Yes Theodoro Grist, MD  omeprazole (PRILOSEC) 20 MG capsule Take 20 mg by mouth 2 (two) times daily before a meal.   Yes [provider]  pantoprazole (PROTONIX) 40 MG tablet Take 1 tablet (40 mg total) by mouth 2 (two) times daily before a meal. 06/11/16  Yes Theodoro Grist, MD  polyvinyl alcohol (ARTIFICIAL TEARS) 1.4 % ophthalmic solution Apply 1 drop to eye 4 (four) times daily as needed. 09/18/15  Yes [provider]  potassium chloride (KLOR-CON) 20 MEQ packet Take 20 mEq by mouth daily.   Yes [provider]  ranitidine (ZANTAC) 150 MG tablet Take 1 tablet by mouth at bedtime.  09/18/15  Yes [provider]  amoxicillin-clavulanate (AUGMENTIN) 400-57 MG/5ML suspension Take 6.3 mLs (500 mg total) by mouth every 12 (twelve) hours for 7 days. 07/07/18 07/14/18  Epifanio Lesches, MD  feeding supplement, ENSURE ENLIVE, (ENSURE ENLIVE) LIQD Take 237 mLs by mouth 3 (three) times daily between meals. 07/07/18   Epifanio Lesches, MD  Multiple Vitamin (MULTIVITAMIN WITH MINERALS) TABS tablet Take 1 tablet by mouth daily.  07/07/18   Epifanio Lesches, MD  mupirocin ointment (BACTROBAN) 2 % Place 1 application into the nose 2 (two) times daily. 07/07/18   Epifanio Lesches, MD   Allergies  Allergen Reactions    . Ceclor [Cefaclor]     Passed out    Review of Systems  Unable to perform ROS: Dementia    Physical Exam  Constitutional: Vital signs are normal. She appears ill.  Thin and chronically ill appearing   Cardiovascular: Normal rate, regular rhythm, normal heart sounds and normal pulses.  Pulmonary/Chest: Effort normal. She has decreased breath sounds.  Abdominal: Soft. Normal appearance and bowel sounds are normal.  Neurological: She is alert. She is disoriented.  Skin: Skin is warm, dry and intact. Bruising noted.  Psychiatric: Cognition and memory are impaired. She expresses inappropriate judgment.  Nursing note and vitals reviewed.   Vital Signs: BP (!) 144/65 (BP Location: Left Arm)   Pulse 82   Temp 97.7 F (36.5 C)   Resp (!) 25   Ht 5' (1.524 m)   Wt 36.6 kg   SpO2 (!) 89%   BMI 15.76 kg/m  Pain Scale: PAINAD   Pain Score: 0-No pain   SpO2: SpO2: (!) 89 % O2 Device:SpO2: (!) 89 % O2 Flow Rate: .O2 Flow Rate (L/min): 2 L/min  IO: Intake/output summary:   Intake/Output Summary (Last 24 hours) at 07/07/2018 1222 Last data filed at 07/07/2018 0509 Gross per 24 hour  Intake 2094.76 ml  Output 750 ml  Net 1344.76 ml    LBM: Last BM Date: 07/06/18 Baseline Weight: Weight: 41.7 kg Most recent weight: Weight: 36.6 kg     Palliative Assessment/Data: PPS 20 %   Time In: 0930 Time Out: 1045 Time Total: 75 min.   Greater than 50%  of this time was spent counseling and coordinating care related to the above assessment and plan.  Signed by:  Alda Lea, AGPCNP-BC Palliative Medicine Team  Phone: [phone] Fax: [phone] Pager: [phone] Amion: Bjorn Pippin    Please contact Palliative Medicine Team phone at [phone] for questions and concerns.  For individual provider: See Shea Evans

## 2018-07-07 NOTE — Progress Notes (Signed)
Speech Language Pathology Treatment: Dysphagia  Patient Details Name: Kelly Booth MRN: [id number] DOB: 04/14/1920 Today's Date: 07/07/2018 Time: 0930-1010 SLP Time Calculation (min) (ACUTE ONLY): 40 min  Assessment / Plan / Recommendation Clinical Impression  Pt seen for ongoing assessment of toleration of least restrictive oral diet; trials to upgrade diet consistency(liquids); education w/ family, staff. Son and family present in room after meeting w/ Palliative Care this morning. Pt was more awake than at any other session since evaluation. No verbalizations noted. Pt had been holding a supplement drink and drinking from a straw(family later stated she used straws at the NH). Son/family are eager to see pt upgrade her oral diet to thin liquids.  Pt continues to appear to present w/ overt presentation of oropharyngeal phase dysphagia w/ increased risk for aspiration thus impact on the Pulmonary status; also risk for inability to sufficiently, and safely, meet her nutritional needs adequately as she exhibits fatigue, weakness, and overt s/s of aspiration w/ trials of thin liquids - she is a dependent feeder as well. Pt consumed po trials of Nectar consistency liquids via Cup then Straw w/ no immediate, overt s/s of aspiration noted. Pt was given trials of Thin liquids via Cup then Straw w/ mild Coughing noted x1 w/ each; pt consumed ~8-9 sips total of the Thin liquids. Pt's Cough was reduced in effort - suspect decreased in strength d/t pt's overall weakness and bed status. No decline in respiratory status noted post trials. Suspect pt has a delay in pharyngeal swallowing; educated family on any laryngeal Penetration and/or Aspiration can increase risk for Pulmonary decline/Pneumonia. During the oral phase, pt exhibited min prolonged oral phase time w/ po trials of liquids but overall functional. Pt achieved oral clearing post trials. She fed self holding the Cup/Straw.   Due to pt's oropharyngeal  phase dysphagia, declined Medical/Cognitive status', and increased risk for aspiration, recommend a Dysphagia level 1 w/ Nectar liquids diet w/ aspiration precautions; Pills in Puree - Crushed. Feeding Support at all meals; Supervision. Son/family have met w/ Palliative Care and request pt to have Thin liquids w/ her meals. Palliative Care stated family's wishes. Pt is being discharged to SNF today. NSG updated.  Education given to family present on aspiration precautions; dysphagia; risks for aspiration; impact of medical and cognitive decline on the swallowing; risk for Pulmonary decline from prandial aspiration; food consistency(puree) and preparation; food consistencies and preparation; liquid consistencies in foods; use of straw. Family agreed.    HPI HPI: Pt is a 84 y.o. female with a known history of moderate malnutrition per PCP notes, COPD, aortic stenosis, pulmonary fibrosis, GERD on PPIs, Afib (no AC, Hx GIB 06/2016), SSS (no PPM) who p/w increased tachypnea/SOB, AMS. Unable to give information; Son at bedside in ED stated pt resides at [facility]. Unsure of pt's baseline Cognitive status. Per Chest CT, Small bilateral pleural effusions and bilateral lower lobe partial consolidative changes and findings of pneumonia; Mucous plugging or secretions in the bilateral lower lobe bronchi.      SLP Plan  Discharge SLP treatment due to (comment)(family is choosing to upgrade diet consistency(liquids))       Recommendations  Diet recommendations: Dysphagia 1 (puree);Thin liquid(per family choice) Liquids provided via: Cup;Straw(w/ monitoring) Medication Administration: Crushed with puree(as able to for easier swallowing) Supervision: Staff to assist with self feeding;Full supervision/cueing for compensatory strategies Compensations: Minimize environmental distractions;Slow rate;Small sips/bites;Lingual sweep for clearance of pocketing;Follow solids with liquid;Multiple dry swallows after  each bite/sip Postural Changes and/or Swallow  Maneuvers: Seated upright 90 degrees;Upright 30-60 min after meal                General recommendations: (Dietician f/u; Palliative Care services) Oral Care Recommendations: Oral care BID;Staff/trained caregiver to provide oral care Follow up Recommendations: Skilled Nursing facility(per family) SLP Visit Diagnosis: Dysphagia, oropharyngeal phase (R13.12) Plan: Discharge SLP treatment due to (comment)(family is choosing to upgrade diet consistency(liquids))       GO                Orinda Kenner, [place], CCC-SLP Jessi Jessop 07/07/2018, 11:10 AM

## 2018-07-07 NOTE — Discharge Summary (Addendum)
ULDINE FUSTER, is a 84 y.o. female  DOB [birth year]  MRN [id number].  Admission date:  07/03/2018  Admitting Physician  Barbaraann Rondo, MD  Discharge Date:  07/07/2018   Primary MD  Raynelle Bring  Recommendations for primary care physician for things to follow:   Follow-up with PCP in 1 week   Admission Diagnosis  DNR (do not resuscitate) [Z66] NSTEMI (non-ST elevated myocardial infarction) (HCC) [I21.4] Sepsis, due to unspecified organism [A41.9] Urinary tract infection without hematuria, site unspecified [N39.0] Dementia without behavioral disturbance, unspecified dementia type (HCC) [F03.90]   Discharge Diagnosis  DNR (do not resuscitate) [Z66] NSTEMI (non-ST elevated myocardial infarction) (HCC) [I21.4] Sepsis, due to unspecified organism [A41.9] Urinary tract infection without hematuria, site unspecified [N39.0] Dementia without behavioral disturbance, unspecified dementia type (HCC) [F03.90]    Active Problems:   Acute on chronic respiratory failure with hypoxemia (HCC)   Protein-calorie malnutrition, severe      Past Medical History:  Diagnosis Date  . A-fib (HCC)   . Allergic rhinitis   . Constipation   . COPD (chronic obstructive pulmonary disease) (HCC)   . Dementia   . Dyspepsia   . Esophageal reflux   . Generalized weakness   . Glaucoma   . Hypertension   . Osteoporosis   . Pulmonary fibrosis (HCC)    There is no evidence of pulmonary fibrosis on CT chest    Past Surgical History:  Procedure Laterality Date  . ABDOMINAL HYSTERECTOMY    . TOTAL HIP ARTHROPLASTY         History of present illness and  Hospital Course:     Kindly see H&P for history of present illness and admission details, please review complete Labs, Consult reports and Test reports for all details in  brief  HPI  from the history and physical done on the day of admission 84 year old female patient with history of aortic stenosis, chronic A. fib, dementia brought in from nursing home because of altered mental status, shortness of breath, suspected sepsis.  Patient MRSA screen has been positive.   Hospital Course   #1 .acute on chronic respiratory failure secondary to bilateral pneumonia community-acquired versus aspiration pneumonia: Initially required BiPAP support so admitted to stepdown unit later on transferred to regular floor, patient initially received vancomycin, cefepime but now on Augmentin, azithromycin.  Will be seen in the palliative care team regarding hospice services at nursing home.  Patient is from hospital.  Will continue Augmentin for 7 days, patient already finished 5 days of azithromycin, so she does not need any more azithromycin at discharge.  Patient initial WBC elevated at 20.4 but decreased.  Patient is on oxygen chronically, continue 2 L of oxygen.,  Continue bronchodilators, Lasix, potassium supplement #2 Klebsiella UTI, urine cultures are showing more than 100,000 colonies of Klebsiella, continue Augmentin to cover for UTI as well. 3.  Severe aortic stenosis: Stable 4.  Proximal atrial fibrillation: Continue metoprolol, patient is not on full anticoagulation due to advanced age, falls 5.  Severe protein calorie malnutrition, continue Ensure 3 times daily between meals. 6.  Palliative care team fall at [facility] a nursing home.  Discussed the plan with patient's son. 7.  Dysphagia, seen by speech therapy recommended dysphagia 1 diet with nectar thick liquids. #8 slightly elevated troponins due to demand ischemia, troponin peaked up to 3 but decreased to 0.94.  Echocardiogram showed EF more than 50 to 55%. 9.  Hypokalemia: Replaced, pota CMP ssium improved from 3.1-4.1   Discharge  Condition: Stable   Follow UP  Follow-up Information    [facility]. Schedule  an appointment as soon as possible for a visit in 1 week(s).   Contact information: [address] [place] [address] [phone]             Discharge Instructions  and  Discharge Medications  If Follow-up with palliative care and hospice.   Diet recommendations: Dysphagia 1 (puree);Nectar-thick liquid Liquids provided via: Teaspoon(possibly Cup w/ monitoring by feeder) Medication Administration: Crushed with puree(as able to ) Supervision: Staff to assist with self feeding;Full supervision/cueing for compensatory strategies Compensations: Minimize environmental distractions;Slow rate;Small sips/bites;Lingual sweep for clearance of pocketing;Follow solids with liquid;Multiple dry swallows after each bite/sip Postural Changes and/or Swallow Maneuvers: Seated upright 90 degrees;Upright 30-60 min after meal          Allergies as of 07/07/2018      Reactions   Ceclor [cefaclor]    Passed out       Medication List    TAKE these medications   acetaminophen 160 MG/5ML elixir Commonly known as:  TYLENOL Take 15 mg/kg by mouth every 4 (four) hours as needed for fever.   albuterol (2.5 MG/3ML) 0.083% nebulizer solution Commonly known as:  PROVENTIL Take 3 mLs (2.5 mg total) by nebulization every 6 (six) hours as needed for wheezing or shortness of breath.   amoxicillin-clavulanate 400-57 MG/5ML suspension Commonly known as:  AUGMENTIN Take 6.3 mLs (500 mg total) by mouth every 12 (twelve) hours for 7 days.   ARTIFICIAL TEARS 1.4 % ophthalmic solution Generic drug:  polyvinyl alcohol Apply 1 drop to eye 4 (four) times daily as needed.   azithromycin 250 MG tablet Commonly known as:  ZITHROMAX Take 1 tablet (250 mg total) by mouth daily.   calcium-vitamin D 500-200 MG-UNIT tablet Take 1 tablet by mouth 2 (two) times daily.   feeding supplement (ENSURE ENLIVE) Liqd Take 237 mLs by mouth 3 (three) times daily between meals.   ferrous sulfate 325 (65 FE)  MG tablet Take 1 tablet by mouth daily.   furosemide 40 MG tablet Commonly known as:  LASIX Take 40 mg by mouth daily.   Ipratropium-Albuterol 20-100 MCG/ACT Aers respimat Commonly known as:  COMBIVENT Inhale 1 puff into the lungs 4 (four) times daily.   latanoprost 0.005 % ophthalmic solution Commonly known as:  XALATAN Apply 1 drop to eye at bedtime.   metoprolol tartrate 25 MG tablet Commonly known as:  LOPRESSOR Take 0.5 tablets (12.5 mg total) by mouth 2 (two) times daily.   multivitamin with minerals Tabs tablet Take 1 tablet by mouth daily.   mupirocin ointment 2 % Commonly known as:  BACTROBAN Place 1 application into the nose 2 (two) times daily.   omeprazole 20 MG capsule Commonly known as:  PRILOSEC Take 20 mg by mouth 2 (two) times daily before a meal.   pantoprazole 40 MG tablet Commonly known as:  PROTONIX Take 1 tablet (40 mg total) by mouth 2 (two) times daily before a meal.   potassium chloride 20 MEQ packet Commonly known as:  KLOR-CON Take 20 mEq by mouth daily.   ranitidine 150 MG tablet Commonly known as:  ZANTAC Take 1 tablet by mouth at bedtime.   STIMULANT LAXATIVE 5 MG EC tablet Generic drug:  bisacodyl Take 5 mg by mouth 2 (two) times daily.   Vitamin D3 1000 units Caps Take 1 capsule by mouth daily.         Diet and Activity  recommendation: See Discharge Instructions above   Consults obtained -, speech, palliative care team   Major procedures and Radiology Reports - PLEASE review detailed and final reports for all details, in brief -     Ct Angio Chest Pe W Or Wo Contrast  Result Date: 07/03/2018 CLINICAL DATA:  84 year old female with concern for pulmonary embolism. EXAM: CT ANGIOGRAPHY CHEST WITH CONTRAST TECHNIQUE: Multidetector CT imaging of the chest was performed using the standard protocol during bolus administration of intravenous contrast. Multiplanar CT image reconstructions and MIPs were obtained to evaluate the  vascular anatomy. CONTRAST:  75mL OMNIPAQUE IOHEXOL 350 MG/ML SOLN COMPARISON:  Chest radiograph dated 07/03/2018 FINDINGS: Evaluation of this exam is limited due to respiratory motion artifact. Cardiovascular: There is no cardiomegaly or pericardial effusion. There is multi vessel coronary vascular calcification as well as calcification of the mitral annulus. Moderate calcified and noncalcified atherosclerotic plaques of the aorta. Evaluation of the pulmonary vasculature is limited due to respiratory motion artifact. Somewhat linear and nonocclusive peripheral density in the left lower lobe pulmonary artery branch (series 8, image 95 and coronal series 10, image 45 and 46) most likely represent chronic clot or scarring. No definite CT evidence of acute or occlusive pulmonary artery embolism. Mediastinum/Nodes: No hilar or mediastinal adenopathy. No mediastinal fluid collection. Lungs/Pleura: Small bilateral pleural effusions. There are consolidative changes of the lower lobes, left greater right. Scattered ground-glass and nodular densities predominantly involving the lower lobe as well as lingula most consistent with pneumonia. Biapical subpleural scarring as well as an area of pleural thickening along the left lateral chest wall. No pneumothorax. The central airways are patent. There is mucous plugging of multiple bilateral lower lobe bronchi. Upper Abdomen: Suboptimally evaluated due to streak artifact. Musculoskeletal: Old healed left rib fractures with associated deformity of the left lateral chest wall. Osteopenia with degenerative changes of the spine. T12 compression fracture with complete loss of vertebral body height and anterior wedging. Compression fractures of the T3, T4, T6, and T9. These fractures are age indeterminate but new since the study of 2015. There is approximately 3 mm retropulsion of the superior posterior cortex of T12. Review of the MIP images confirms the above findings. IMPRESSION: 1.  No definite CT evidence of acute pulmonary embolism. Linear nonocclusive density in the left lower lobe pulmonary artery most likely chronic clot or scarring. 2. Small bilateral pleural effusions and bilateral lower lobe partial consolidative changes and findings of pneumonia. Aspiration is not excluded. Clinical correlation is recommended. 3. Mucous plugging or secretions in the bilateral lower lobe bronchi. 4. Multiple compression fractures of the thoracic spine, age indeterminate. Near complete loss of vertebral body height at T12. Electronically Signed   By: Elgie Collard M.D.   On: 07/03/2018 07:06   Dg Chest Portable 1 View  Result Date: 07/03/2018 CLINICAL DATA:  84 year old female with respiratory difficulty. EXAM: PORTABLE CHEST 1 VIEW COMPARISON:  Chest radiograph dated 12/24/2017 FINDINGS: The patient is rotated and tilted. There is emphysematous changes of the lungs with hyperexpansion and flattening of the diaphragms. Small bilateral pleural effusions may be present. No pneumothorax. No lobar consolidative changes. Stable cardiac silhouette. Atherosclerotic calcification of the aorta. Osteopenia. No acute osseous pathology. IMPRESSION: No focal consolidation.  No significant interval change. Electronically Signed   By: Elgie Collard M.D.   On: 07/03/2018 03:42    Micro Results     Recent Results (from the past 240 hour(s))  Blood Culture (routine x 2)     Status: None (  Preliminary result)   Collection Time: 07/03/18  4:08 AM  Result Value Ref Range Status   Specimen Description BLOOD BLOOD LEFT FOREARM  Final   Special Requests   Final    BOTTLES DRAWN AEROBIC AND ANAEROBIC Blood Culture results may not be optimal due to an excessive volume of blood received in culture bottles   Culture   Final    NO GROWTH 4 DAYS Performed at [facility], [address] [place], [place] [address]    Report Status PENDING  Incomplete  Blood Culture (routine x 2)     Status: None  (Preliminary result)   Collection Time: 07/03/18  4:08 AM  Result Value Ref Range Status   Specimen Description   Final    BLOOD Blood Culture results may not be optimal due to an excessive volume of blood received in culture bottles   Special Requests   Final    BOTTLES DRAWN AEROBIC AND ANAEROBIC BLOOD RIGHT FOREARM   Culture   Final    NO GROWTH 4 DAYS Performed at [facility], [address], [place], [place] [address]    Report Status PENDING  Incomplete  Urine culture     Status: Abnormal   Collection Time: 07/03/18  4:08 AM  Result Value Ref Range Status   Specimen Description   Final    URINE, CATHETERIZED Performed at [facility], [address] [place], [place] [address]    Special Requests   Final    NONE Performed at [facility], [address], [place], [place] [address]    Culture >=100,000 COLONIES/mL KLEBSIELLA PNEUMONIAE (A)  Final   Report Status 07/05/2018 FINAL  Final   Organism ID, Bacteria KLEBSIELLA PNEUMONIAE (A)  Final      Susceptibility   Klebsiella pneumoniae - MIC*    AMPICILLIN RESISTANT Resistant     CEFAZOLIN <=4 SENSITIVE Sensitive     CEFTRIAXONE <=1 SENSITIVE Sensitive     CIPROFLOXACIN <=0.25 SENSITIVE Sensitive     GENTAMICIN <=1 SENSITIVE Sensitive     IMIPENEM <=0.25 SENSITIVE Sensitive     NITROFURANTOIN 32 SENSITIVE Sensitive     TRIMETH/SULFA <=20 SENSITIVE Sensitive     AMPICILLIN/SULBACTAM 4 SENSITIVE Sensitive     PIP/TAZO <=4 SENSITIVE Sensitive     Extended ESBL NEGATIVE Sensitive     * >=100,000 COLONIES/mL KLEBSIELLA PNEUMONIAE  MRSA PCR Screening     Status: Abnormal   Collection Time: 07/03/18  8:14 AM  Result Value Ref Range Status   MRSA by PCR POSITIVE (A) NEGATIVE Final    Comment:        The GeneXpert MRSA Assay (FDA approved for NASAL specimens only), is one component of a comprehensive MRSA colonization surveillance program. It is not intended to diagnose MRSA infection nor  to guide or monitor treatment for MRSA infections. RESULT CALLED TO, READ BACK BY AND VERIFIED WITH: Leola Brazil 07/03/18 @ 1035  MLK Performed at [facility], [address] [place], [place] [address]        Today   Subjective:   Madhavi Hamblen stable for discharge to skilled nursing  Objective:   Blood pressure (!) 144/65, pulse 82, temperature 97.7 F (36.5 C), resp. rate (!) 25, height 5' (1.524 m), weight 36.6 kg, SpO2 (!) 89 %.   Intake/Output Summary (Last 24 hours) at 07/07/2018 0842 Last data filed at 07/07/2018 0509 Gross per 24 hour  Intake 2094.76 ml  Output 750 ml  Net 1344.76  ml    Exam Awake Alert, Oriented x 3, No new F.N deficits, Normal affect Sac.AT,PERRAL Supple Neck,No JVD, No cervical lymphadenopathy appriciated.  Symmetrical Chest wall movement, Good air movement bilaterally, CTAB RRR,No Gallops,Rubs or new Murmurs, No Parasternal Heave +ve B.Sounds, Abd Soft, Non tender, No organomegaly appriciated, No rebound -guarding or rigidity. No Cyanosis, Clubbing or edema, No new Rash or bruise  Data Review   CBC w Diff:  Lab Results  Component Value Date   WBC 9.0 07/06/2018   HGB 10.2 (L) 07/06/2018   HGB 12.6 08/21/2014   HCT 28.8 (L) 07/06/2018   HCT 36.8 08/21/2014   PLT 199 07/06/2018   PLT 223 08/21/2014   LYMPHOPCT 9 07/03/2018   LYMPHOPCT 7.9 10/15/2013   MONOPCT 5 07/03/2018   MONOPCT 7.5 10/15/2013   EOSPCT 0 07/03/2018   EOSPCT 0.0 10/15/2013   BASOPCT 0 07/03/2018   BASOPCT 0.5 10/15/2013    CMP:  Lab Results  Component Value Date   NA 141 07/06/2018   NA 138 08/21/2014   K 3.8 07/06/2018   K 3.8 08/21/2014   CL 106 07/06/2018   CL 100 08/21/2014   CO2 29 07/06/2018   CO2 33 (H) 08/21/2014   BUN 12 07/06/2018   BUN 32 (H) 08/21/2014   CREATININE 0.73 07/06/2018   CREATININE 1.27 08/21/2014   PROT 7.2 07/03/2018   PROT 7.4 08/21/2014   ALBUMIN 3.0 (L) 07/03/2018   ALBUMIN 3.7 08/21/2014   BILITOT 1.2  07/03/2018   BILITOT 0.4 08/21/2014   ALKPHOS 69 07/03/2018   ALKPHOS 76 08/21/2014   AST 27 07/03/2018   AST 20 08/21/2014   ALT 9 07/03/2018   ALT 18 08/21/2014  .   Total Time in preparing paper work, data evaluation and todays exam - 35 minutes  Katha Hamming M.D on 07/07/2018 at 8:42 AM    Note: This dictation was prepared with Dragon dictation along with smaller phrase technology. Any transcriptional errors that result from this process are unintentional.

## 2018-07-07 NOTE — Clinical Social Work Note (Signed)
Patient is medically ready for discharge today. CSW notified patient's son Tariana Moldovan [phone] of discharge back to [facility] today. CSW also notified [name] at [facility] of discharge today. RN to call report and call for EMS transport.   Ruthe Mannan MSW, [address] [phone]

## 2018-07-07 NOTE — Clinical Social Work Note (Signed)
CSW notified by Palliative NP that patient's family has decided that they would like patient to return to [facility] with Palliative care. CSW notified [facility] that patient will return. CSW will continue to follow for discharge planning.   Ruthe Mannan MSW, [address] [phone]

## 2018-07-08 LAB — CULTURE, BLOOD (ROUTINE X 2)
CULTURE: NO GROWTH
CULTURE: NO GROWTH

## 2018-08-17 ENCOUNTER — Encounter: Payer: Self-pay | Admitting: Emergency Medicine

## 2018-08-17 ENCOUNTER — Other Ambulatory Visit: Payer: Self-pay

## 2018-08-17 ENCOUNTER — Emergency Department
Admission: EM | Admit: 2018-08-17 | Discharge: 2018-08-17 | Disposition: A | Discharge status: Skilled Nursing Facility | Payer: Medicare Other | Attending: Emergency Medicine | Admitting: Emergency Medicine

## 2018-08-17 ENCOUNTER — Emergency Department: Payer: Medicare Other

## 2018-08-17 DIAGNOSIS — F0390 Unspecified dementia without behavioral disturbance: Secondary | ICD-10-CM | POA: Diagnosis not present

## 2018-08-17 DIAGNOSIS — R630 Anorexia: Secondary | ICD-10-CM | POA: Insufficient documentation

## 2018-08-17 DIAGNOSIS — I10 Essential (primary) hypertension: Secondary | ICD-10-CM | POA: Diagnosis not present

## 2018-08-17 DIAGNOSIS — R4182 Altered mental status, unspecified: Secondary | ICD-10-CM | POA: Diagnosis present

## 2018-08-17 DIAGNOSIS — Z79899 Other long term (current) drug therapy: Secondary | ICD-10-CM | POA: Insufficient documentation

## 2018-08-17 DIAGNOSIS — I4891 Unspecified atrial fibrillation: Secondary | ICD-10-CM

## 2018-08-17 DIAGNOSIS — J449 Chronic obstructive pulmonary disease, unspecified: Secondary | ICD-10-CM | POA: Insufficient documentation

## 2018-08-17 DIAGNOSIS — N390 Urinary tract infection, site not specified: Secondary | ICD-10-CM | POA: Diagnosis not present

## 2018-08-17 DIAGNOSIS — Z96649 Presence of unspecified artificial hip joint: Secondary | ICD-10-CM | POA: Diagnosis not present

## 2018-08-17 LAB — CBC WITH DIFFERENTIAL/PLATELET
Abs Immature Granulocytes: 0.07 10*3/uL (ref 0.00–0.07)
BASOS ABS: 0 10*3/uL (ref 0.0–0.1)
BASOS PCT: 0 %
Eosinophils Absolute: 0 10*3/uL (ref 0.0–0.5)
Eosinophils Relative: 0 %
HCT: 34.8 % — ABNORMAL LOW (ref 36.0–46.0)
Hemoglobin: 10.9 g/dL — ABNORMAL LOW (ref 12.0–15.0)
IMMATURE GRANULOCYTES: 1 %
Lymphocytes Relative: 10 %
Lymphs Abs: 1.1 10*3/uL (ref 0.7–4.0)
MCH: 33.7 pg (ref 26.0–34.0)
MCHC: 31.3 g/dL (ref 30.0–36.0)
MCV: 107.7 fL — AB (ref 80.0–100.0)
Monocytes Absolute: 0.9 10*3/uL (ref 0.1–1.0)
Monocytes Relative: 7 %
NEUTROS ABS: 9.8 10*3/uL — AB (ref 1.7–7.7)
NEUTROS PCT: 82 %
NRBC: 0 % (ref 0.0–0.2)
PLATELETS: 275 10*3/uL (ref 150–400)
RBC: 3.23 MIL/uL — AB (ref 3.87–5.11)
RDW: 12.7 % (ref 11.5–15.5)
WBC: 11.9 10*3/uL — AB (ref 4.0–10.5)

## 2018-08-17 LAB — TROPONIN I: Troponin I: 0.04 ng/mL (ref <0.03)

## 2018-08-17 LAB — COMPREHENSIVE METABOLIC PANEL
ALBUMIN: 3.3 g/dL — AB (ref 3.5–5.0)
ALT: 14 U/L (ref 0–44)
AST: 21 U/L (ref 15–41)
Alkaline Phosphatase: 80 U/L (ref 38–126)
Anion gap: 8 (ref 5–15)
BILIRUBIN TOTAL: 0.6 mg/dL (ref 0.3–1.2)
BUN: 39 mg/dL — AB (ref 8–23)
CHLORIDE: 100 mmol/L (ref 98–111)
CO2: 39 mmol/L — AB (ref 22–32)
Calcium: 9.6 mg/dL (ref 8.9–10.3)
Creatinine, Ser: 1 mg/dL (ref 0.44–1.00)
GFR calc Af Amer: 53 mL/min — ABNORMAL LOW (ref >60)
GFR calc non Af Amer: 46 mL/min — ABNORMAL LOW (ref >60)
GLUCOSE: 122 mg/dL — AB (ref 70–99)
POTASSIUM: 4.6 mmol/L (ref 3.5–5.1)
SODIUM: 147 mmol/L — AB (ref 135–145)
Total Protein: 7.8 g/dL (ref 6.5–8.1)

## 2018-08-17 LAB — URINALYSIS, COMPLETE (UACMP) WITH MICROSCOPIC
Bacteria, UA: NONE SEEN
Bilirubin Urine: NEGATIVE
Glucose, UA: NEGATIVE mg/dL
Ketones, ur: NEGATIVE mg/dL
Nitrite: NEGATIVE
PH: 6 (ref 5.0–8.0)
PROTEIN: NEGATIVE mg/dL
Specific Gravity, Urine: 1.009 (ref 1.005–1.030)

## 2018-08-17 LAB — BRAIN NATRIURETIC PEPTIDE: B Natriuretic Peptide: 1253 pg/mL — ABNORMAL HIGH (ref 0.0–100.0)

## 2018-08-17 MED ORDER — DILTIAZEM HCL 25 MG/5ML IV SOLN
5.0000 mg | Freq: Once | INTRAVENOUS | Status: DC
  Filled 2018-08-17: qty 5

## 2018-08-17 MED ORDER — SODIUM CHLORIDE 0.9 % IV BOLUS: 500.0000 mL | Freq: Once | INTRAVENOUS | Status: DC

## 2018-08-17 MED ORDER — SODIUM CHLORIDE 0.9 % IV SOLN
1.0000 g | Freq: Once | INTRAVENOUS | Status: AC
  Administered 2018-08-17: 1 g via INTRAVENOUS
  Filled 2018-08-17: qty 10

## 2018-08-17 MED ORDER — CEPHALEXIN 500 MG PO CAPS: 500.0000 mg | ORAL_CAPSULE | Freq: Three times a day (TID) | ORAL | 0 refills | Status: AC

## 2018-08-17 NOTE — Discharge Instructions (Addendum)
Please seek medical attention for any high fevers, chest pain, shortness of breath, change in behavior, persistent vomiting, bloody stool or any other new or concerning symptoms.  

## 2018-08-17 NOTE — ED Provider Notes (Signed)
[facility] Emergency Department Provider Note ____________________________________________   First MD Initiated Contact with Patient 08/17/18 1144     (approximate)  I have reviewed the triage vital signs and the nursing notes.   HISTORY  Chief Complaint Atrial Fibrillation  Level 5 caveat: History of present illness limited due to dementia  HPI Kelly Booth is a 84 y.o. female with PMH as noted below including paroxysmal atrial fibrillation presents with decreased responsiveness, acutely this morning, associated decreased appetite.  She was also found to be in rapid atrial fibrillation.  The son is concerned she could have a UTI as she has presented similarly with UTI before.   Past Medical History:  Diagnosis Date  . A-fib (HCC)   . Allergic rhinitis   . Constipation   . COPD (chronic obstructive pulmonary disease) (HCC)   . Dementia (HCC)   . Dyspepsia   . Esophageal reflux   . Generalized weakness   . Glaucoma   . Hypertension   . Osteoporosis   . Pulmonary fibrosis (HCC)    There is no evidence of pulmonary fibrosis on CT chest    Patient Active Problem List   Diagnosis Date Noted  . Protein-calorie malnutrition, severe 07/04/2018  . Acute on chronic respiratory failure with hypoxemia (HCC) 07/03/2018  . Sepsis (HCC) 12/24/2017  . Acute respiratory failure with hypoxia (HCC) 10/13/2017  . Acute low back pain 10/12/2017  . Compression fracture of body of thoracic vertebra (HCC) 10/12/2017  . Acute respiratory failure with hypoxia and hypercapnia (HCC) 06/11/2016  . Acute diastolic CHF (congestive heart failure) (HCC) 06/11/2016  . Bilateral pleural effusion 06/11/2016  . Severe aortic stenosis 06/11/2016  . Acute posthemorrhagic anemia 06/11/2016  . Acute renal insufficiency 06/11/2016  . A. fib, RVR 06/11/2016  . Atrial fibrillation with RVR (HCC) 06/11/2016  . Elevated troponin 06/11/2016  . UTI (urinary tract infection)  06/11/2016  . DNR (do not resuscitate) 06/09/2016  . Palliative care by specialist 06/09/2016  . Weakness generalized 06/09/2016  . Dyspnea   . Bleeding gastrointestinal 06/05/2016  . Malnutrition of moderate degree 12/06/2015  . Bradycardia 12/05/2015    Past Surgical History:  Procedure Laterality Date  . ABDOMINAL HYSTERECTOMY    . TOTAL HIP ARTHROPLASTY      Prior to Admission medications   Medication Sig Start Date End Date Taking? Authorizing Provider  albuterol (PROVENTIL) (2.5 MG/3ML) 0.083% nebulizer solution Take 3 mLs (2.5 mg total) by nebulization every 6 (six) hours as needed for wheezing or shortness of breath. 10/15/17  Yes Enedina Finner, MD  bisacodyl (STIMULANT LAXATIVE) 5 MG EC tablet Take 5 mg by mouth 2 (two) times daily.  09/18/15  Yes [provider]  Calcium Carbonate-Vitamin D (CALCIUM-VITAMIN D) 500-200 MG-UNIT tablet Take 1 tablet by mouth 2 (two) times daily. 09/18/15  Yes [provider]  ferrous sulfate 325 (65 FE) MG tablet Take 1 tablet by mouth daily. 09/18/15  Yes [provider]  furosemide (LASIX) 40 MG tablet Take 40 mg by mouth daily.  09/18/15  Yes [provider]  latanoprost (XALATAN) 0.005 % ophthalmic solution Apply 1 drop to eye at bedtime. 09/18/15  Yes [provider]  metoprolol tartrate (LOPRESSOR) 25 MG tablet Take 0.5 tablets (12.5 mg total) by mouth 2 (two) times daily. 06/11/16  Yes Katharina Caper, MD  omeprazole (PRILOSEC) 20 MG capsule Take 20 mg by mouth 2 (two) times daily before a meal.   Yes [provider]  polyvinyl  alcohol (ARTIFICIAL TEARS) 1.4 % ophthalmic solution Apply 1 drop to eye 4 (four) times daily as needed. 09/18/15  Yes [provider]  potassium chloride (KLOR-CON) 20 MEQ packet Take 20 mEq by mouth daily.   Yes [provider]  ranitidine (ZANTAC) 150 MG tablet Take 1 tablet by mouth at bedtime.  09/18/15  Yes [provider]  acetaminophen  (TYLENOL) 160 MG/5ML elixir Take 640 mg by mouth every 4 (four) hours as needed for fever.     [provider]  Cholecalciferol (VITAMIN D3) 1000 units CAPS Take 1 capsule by mouth daily. 09/18/15   [provider]  feeding supplement, ENSURE ENLIVE, (ENSURE ENLIVE) LIQD Take 237 mLs by mouth 3 (three) times daily between meals. Patient not taking: Reported on 08/17/2018 07/07/18   Katha HammingKonidena, Snehalatha, MD  Ipratropium-Albuterol (COMBIVENT) 20-100 MCG/ACT AERS respimat Inhale 1 puff into the lungs 4 (four) times daily. 09/18/15   [provider]  Multiple Vitamin (MULTIVITAMIN WITH MINERALS) TABS tablet Take 1 tablet by mouth daily. Patient not taking: Reported on 08/17/2018 07/07/18   Katha HammingKonidena, Snehalatha, MD  mupirocin ointment (BACTROBAN) 2 % Place 1 application into the nose 2 (two) times daily. Patient not taking: Reported on 08/17/2018 07/07/18   Katha HammingKonidena, Snehalatha, MD  pantoprazole (PROTONIX) 40 MG tablet Take 1 tablet (40 mg total) by mouth 2 (two) times daily before a meal. Patient not taking: Reported on 08/17/2018 06/11/16   Katharina CaperVaickute, Rima, MD    Allergies Ceclor [cefaclor]  Family History  Problem Relation Age of Onset  . Hypertension Other     Social History Social History   Tobacco Use  . Smoking status: Never Smoker  . Smokeless tobacco: Never Used  Substance Use Topics  . Alcohol use: No    Alcohol/week: 0.0 standard drinks  . Drug use: No    Review of Systems Level 5 caveat: Unable to obtain review of systems due to dementia    ____________________________________________   PHYSICAL EXAM:  VITAL SIGNS: ED Triage Vitals  Enc Vitals Group     BP 08/17/18 1126 107/62     Pulse Rate 08/17/18 1126 (!) 144     Resp 08/17/18 1126 (!) 24     Temp 08/17/18 1126 98.6 F (37 C)     Temp Source 08/17/18 1126 Oral     SpO2 08/17/18 1126 100 %     Weight 08/17/18 1125 80 lb 11 oz (36.6 kg)     Height --      Head Circumference --       Peak Flow --      Pain Score 08/17/18 1125 0     Pain Loc --      Pain Edu? --      Excl. in GC? --     Constitutional: Somnolent but arousable.  Following commands. Eyes: Conjunctivae are normal.   Head: Atraumatic. Nose: No congestion/rhinnorhea. Mouth/Throat: Mucous membranes are slightly dry.   Neck: Normal range of motion.  Cardiovascular: Tachycardic, irregular rhythm. Grossly normal heart sounds.  Good peripheral circulation. Respiratory: Normal respiratory effort.  No retractions.  Slightly decreased breath sounds bilaterally. Gastrointestinal: Soft and nontender. No distention.  Genitourinary: No flank tenderness. Musculoskeletal: No lower extremity edema.  Extremities warm and well perfused.  Neurologic: Motor intact in all extremities. Skin:  Skin is warm and dry. No rash noted. Psychiatric: Unable to assess. ____________________________________________   LABS (all labs ordered are listed, but only abnormal results are displayed)  Labs Reviewed  CBC  WITH DIFFERENTIAL/PLATELET - Abnormal; Notable for the following components:      Result Value   WBC 11.9 (*)    RBC 3.23 (*)    Hemoglobin 10.9 (*)    HCT 34.8 (*)    MCV 107.7 (*)    Neutro Abs 9.8 (*)    All other components within normal limits  TROPONIN I - Abnormal; Notable for the following components:   Troponin I 0.04 (*)    All other components within normal limits  BRAIN NATRIURETIC PEPTIDE - Abnormal; Notable for the following components:   B Natriuretic Peptide 1,253.0 (*)    All other components within normal limits  COMPREHENSIVE METABOLIC PANEL - Abnormal; Notable for the following components:   Sodium 147 (*)    CO2 39 (*)    Glucose, Bld 122 (*)    BUN 39 (*)    Albumin 3.3 (*)    GFR calc non Af Amer 46 (*)    GFR calc Af Amer 53 (*)    All other components within normal limits  URINALYSIS, COMPLETE (UACMP) WITH MICROSCOPIC   ____________________________________________  EKG  ED ECG  REPORT I, Dionne Bucy, the attending physician, personally viewed and interpreted this ECG.  Date: 08/17/2018 EKG Time: 1128 Rate: 141 Rhythm: Atrial fibrillation with RVR QRS Axis: normal Intervals: normal ST/T Wave abnormalities: Repolarization abnormality laterally Narrative Interpretation: Atrial fibrillation with RVR but no evidence of acute ischemia  ____________________________________________  RADIOLOGY  CXR: No focal infiltrate  ____________________________________________   PROCEDURES  Procedure(s) performed: No  Procedures  Critical Care performed: No ____________________________________________   INITIAL IMPRESSION / ASSESSMENT AND PLAN / ED COURSE  Pertinent labs & imaging results that were available during my care of the patient were reviewed by me and considered in my medical decision making (see chart for details).  84 year old female with PMH as noted above presents with decreased responsiveness since this morning, and was found to be in atrial fibrillation with RVR.  On exam, the patient is elderly and frail appearing.  Her vital signs are normal except for heart rate in the 140s 150s and borderline low BP.  She has no respiratory distress or rales.  The remainder of the exam is as described above.  Her neurologic exam is nonfocal and there is no evidence of trauma.  Overall I suspect that her mental status may be related to her atrial fibrillation, versus other causes such as UTI, other infection, dehydration or other metabolic etiology, or less likely ACS.  We will obtain labs, chest x-ray, UA, give fluids to help improve the BP and a dose of Cardizem, and reassess.  ----------------------------------------- 1:30 PM on 08/17/2018 -----------------------------------------  The patient broke out of the atrial fibrillation on her own prior to any medications.  Heart rate is now in the 80s and her blood pressure has improved.  I counseled the  fluids and Cardizem.  We are awaiting the rest of the work-up.  ----------------------------------------- 3:16 PM on 08/17/2018 -----------------------------------------  The lab work-up is relatively unremarkable.  The troponin is improved from prior and is consistent with the patient having been in rapid atrial fibrillation.  Her chemistry and CBC are all consistent with her prior except that she has a slight alkalosis.  Her BNP is elevated but this is chronic.  The patient has remained stable and her heart rate is normal.  She is still pending the UA.  I think that if the patient's vitals remained stable even if she has a UTI  she likely will be able to go back to her facility with a course of antibiotics.  The son agrees with this plan.  I am signing out the patient to the oncoming physician Dr. Derrill Kay.   ____________________________________________   FINAL CLINICAL IMPRESSION(S) / ED DIAGNOSES  Final diagnoses:  Atrial fibrillation with rapid ventricular response (HCC)      NEW MEDICATIONS STARTED DURING THIS VISIT:  New Prescriptions   No medications on file     Note:  This document was prepared using Dragon voice recognition software and may include unintentional dictation errors.    Dionne Bucy, MD 08/17/18 1517

## 2018-08-17 NOTE — ED Provider Notes (Signed)
UA consistent with UTI. Discussed this with family and patient. Patient given dose of antibiotics here. Will discharge with further antibiotics.    Kelly Booth, Kelly Touch, MD 08/17/18 [id number]

## 2018-08-17 NOTE — ED Triage Notes (Signed)
[facility]>  Son observed this morning that patient was less responsive and not eating well.  Has History of Afib RVR.  Initial pulse 120-170 .  232 ga PIV right FA.  CBG:  128.  T 98.6 po.  Normally wears 2l/ [place].  Patient has a DNR in place.

## 2018-08-17 NOTE — ED Notes (Signed)
[facility]  CALLED  FOR  TRANSPORT 

## 2018-08-17 NOTE — ED Notes (Signed)
Pts BP increased to 124/55 and pulse lowered to 85. Recommended hold NS bolus and diltiazem. Dr Marisa SeverinSiadecki notified and agreed with med hold.

## 2018-08-20 LAB — URINE CULTURE: Culture: 30000 — AB

## 2019-04-05 DEATH — deceased
